# Patient Record
Sex: Male | Born: 1935
Health system: Southern US, Community
[De-identification: ages and names within clinical notes are randomized; demographics above are authoritative.]

## PROBLEM LIST (undated history)

## (undated) DIAGNOSIS — I10 Essential (primary) hypertension: Secondary | ICD-10-CM

## (undated) DIAGNOSIS — N400 Enlarged prostate without lower urinary tract symptoms: Secondary | ICD-10-CM

## (undated) DIAGNOSIS — J42 Unspecified chronic bronchitis: Secondary | ICD-10-CM

## (undated) DIAGNOSIS — I2510 Atherosclerotic heart disease of native coronary artery without angina pectoris: Secondary | ICD-10-CM

## (undated) DIAGNOSIS — F419 Anxiety disorder, unspecified: Secondary | ICD-10-CM

## (undated) DIAGNOSIS — J449 Chronic obstructive pulmonary disease, unspecified: Secondary | ICD-10-CM

## (undated) DIAGNOSIS — G20A1 Parkinson's disease without dyskinesia, without mention of fluctuations: Secondary | ICD-10-CM

## (undated) DIAGNOSIS — J189 Pneumonia, unspecified organism: Secondary | ICD-10-CM

## (undated) DIAGNOSIS — C44300 Unspecified malignant neoplasm of skin of unspecified part of face: Secondary | ICD-10-CM

## (undated) DIAGNOSIS — Z9981 Dependence on supplemental oxygen: Secondary | ICD-10-CM

## (undated) DIAGNOSIS — K219 Gastro-esophageal reflux disease without esophagitis: Secondary | ICD-10-CM

## (undated) DIAGNOSIS — I639 Cerebral infarction, unspecified: Secondary | ICD-10-CM

## (undated) DIAGNOSIS — M1990 Unspecified osteoarthritis, unspecified site: Secondary | ICD-10-CM

## (undated) DIAGNOSIS — I4891 Unspecified atrial fibrillation: Secondary | ICD-10-CM

## (undated) DIAGNOSIS — J4489 Other specified chronic obstructive pulmonary disease: Secondary | ICD-10-CM

## (undated) DIAGNOSIS — E7800 Pure hypercholesterolemia, unspecified: Secondary | ICD-10-CM

## (undated) DIAGNOSIS — G20 Parkinson's disease: Secondary | ICD-10-CM

## (undated) HISTORY — DX: Chronic obstructive pulmonary disease, unspecified: J44.9

## (undated) HISTORY — DX: Essential (primary) hypertension: I10

## (undated) HISTORY — PX: FRACTURE SURGERY: SHX138

## (undated) HISTORY — DX: Unspecified osteoarthritis, unspecified site: M19.90

## (undated) HISTORY — DX: Benign prostatic hyperplasia without lower urinary tract symptoms: N40.0

## (undated) HISTORY — DX: Other specified chronic obstructive pulmonary disease: J44.89

## (undated) HISTORY — DX: Pure hypercholesterolemia, unspecified: E78.00

## (undated) HISTORY — PX: TIBIA FRACTURE SURGERY: SHX806

## (undated) HISTORY — PX: CERVICAL DISCECTOMY: SHX98

## (undated) HISTORY — PX: SKIN CANCER DESTRUCTION: SHX778

## (undated) HISTORY — DX: Atherosclerotic heart disease of native coronary artery without angina pectoris: I25.10

---

## 2002-08-27 ENCOUNTER — Inpatient Hospital Stay (HOSPITAL_COMMUNITY): Admission: AC | Admit: 2002-08-27 | Discharge: 2002-08-31 | Payer: Self-pay

## 2002-08-27 ENCOUNTER — Encounter: Payer: Self-pay | Admitting: Emergency Medicine

## 2002-08-28 ENCOUNTER — Encounter: Payer: Self-pay | Admitting: General Surgery

## 2002-08-29 ENCOUNTER — Encounter: Payer: Self-pay | Admitting: General Surgery

## 2002-09-16 ENCOUNTER — Encounter: Payer: Self-pay | Admitting: General Surgery

## 2002-09-16 ENCOUNTER — Ambulatory Visit (HOSPITAL_COMMUNITY): Admission: RE | Admit: 2002-09-16 | Discharge: 2002-09-16 | Payer: Self-pay | Admitting: General Surgery

## 2007-12-07 ENCOUNTER — Emergency Department: Payer: Self-pay | Admitting: Emergency Medicine

## 2007-12-07 ENCOUNTER — Other Ambulatory Visit: Payer: Self-pay

## 2008-08-27 ENCOUNTER — Ambulatory Visit: Payer: Self-pay | Admitting: Cardiology

## 2008-09-02 ENCOUNTER — Ambulatory Visit: Payer: Self-pay

## 2008-09-09 ENCOUNTER — Ambulatory Visit: Payer: Self-pay | Admitting: Internal Medicine

## 2008-09-09 ENCOUNTER — Encounter: Payer: Self-pay | Admitting: Cardiology

## 2008-09-09 LAB — CONVERTED CEMR LAB
CO2: 24 meq/L (ref 19–32)
Calcium: 9.1 mg/dL (ref 8.4–10.5)
Glucose, Bld: 95 mg/dL (ref 70–99)
HCT: 42.7 % (ref 39.0–52.0)
Hemoglobin: 14.2 g/dL (ref 13.0–17.0)
INR: 1.2 (ref 0.0–1.5)
Potassium: 4.9 meq/L (ref 3.5–5.3)
RBC: 4.89 M/uL (ref 4.22–5.81)
RDW: 14 % (ref 11.5–15.5)
Sodium: 137 meq/L (ref 135–145)
WBC: 7.3 10*3/uL (ref 4.0–10.5)

## 2008-09-10 ENCOUNTER — Inpatient Hospital Stay (HOSPITAL_BASED_OUTPATIENT_CLINIC_OR_DEPARTMENT_OTHER): Admission: RE | Admit: 2008-09-10 | Discharge: 2008-09-10 | Payer: Self-pay | Admitting: Neurosurgery

## 2008-09-10 ENCOUNTER — Ambulatory Visit: Payer: Self-pay | Admitting: Cardiology

## 2008-09-17 ENCOUNTER — Ambulatory Visit: Payer: Self-pay | Admitting: Cardiology

## 2009-04-09 DIAGNOSIS — I2583 Coronary atherosclerosis due to lipid rich plaque: Secondary | ICD-10-CM

## 2009-04-09 DIAGNOSIS — E7800 Pure hypercholesterolemia, unspecified: Secondary | ICD-10-CM

## 2009-04-09 DIAGNOSIS — I10 Essential (primary) hypertension: Secondary | ICD-10-CM | POA: Insufficient documentation

## 2009-04-09 DIAGNOSIS — I2510 Atherosclerotic heart disease of native coronary artery without angina pectoris: Secondary | ICD-10-CM

## 2009-04-09 DIAGNOSIS — Z87898 Personal history of other specified conditions: Secondary | ICD-10-CM

## 2009-04-09 DIAGNOSIS — M1990 Unspecified osteoarthritis, unspecified site: Secondary | ICD-10-CM | POA: Insufficient documentation

## 2009-04-09 DIAGNOSIS — J441 Chronic obstructive pulmonary disease with (acute) exacerbation: Secondary | ICD-10-CM

## 2011-05-15 NOTE — Assessment & Plan Note (Signed)
[facility] OFFICE NOTE   NAME:Terry Taylor, Terry Taylor                   MRN:          [id number]  DATE:08/27/2008                            DOB:          11/19/1928    PRIMARY CARE PHYSICIAN:  Dr. Galen Daft. Guffey.   HISTORY OF PRESENT ILLNESS:  This is a 77 year old with a history of  hypertension, hypercholesterolemia, and COPD who presents to Cardiology  clinic for evaluation of dyspnea on exertion and chest pain.  The  patient states that for the last 6 months or so, he has been getting  some very mild chest heaviness with exertion.  Things that bring this on  include climbing up a flight of steps or carrying groceries into the  house.  He describes it as a heaviness, it is mild 2-3/10 on a pain  scale and does not radiate.  He also gets some dyspnea on exertion with  the same degree of exertion.  He will get short of breath after walking  about 50-100 yards on flat ground as well.  Occasionally, he does get  some bilateral leg aching when he walks about 50-100 yards also.  He  denies orthopnea and denies PND.  He denies syncope, presyncope, or  palpitations.  He continues to be quite active.  [place] and raises and boards horses.  He was a smoker in the past, but  quit.  He says about 30 years ago.  He does report frequent bronchitis  episodes over the winter and appears that he has been diagnosed with  COPD and has been on a course of steroids in the past.   PAST MEDICAL HISTORY:  1. Hypertension.  2. Hypercholesterolemia.  3. Chronic obstructive pulmonary disease.  Notes from his primary care      physician state that he had moderate obstruction on PFTs.  He does      have a history of what appear to be a COPD exacerbation.  He denies      wheezing.  4. Benign prostatic hypertrophy.  5. Osteoporosis.  6. History of osteoarthritis.  7. Echocardiogram done in August 2009 at his primary  care physician's      office, it was read as having an EF of 61%, mild left atrial      enlargement, mild left ventricular hypertrophy, mild diastolic      dysfunction, mild mitral regurgitation, and aortic sclerosis.   LABORATORY STUDIES:  Most recent labs were done in August 2009 at his  primary care physician's office, a hematocrit of 43.2, platelets of 180,  BUN 25, creatinine 1.33, glucose 106, triglycerides 144, HDL 42, LDL 73,  and TSH was normal.   MEDICATIONS:  1. He has an albuterol nebulizer p.r.n., he is not using this daily.  2. Pravastatin 40 mg daily.  3. Lisinopril 20 mg daily.   SOCIAL HISTORY:  The patient lives with his wife on a 100-acre farm near  [place].  He raises and boards horses.  He is retired [employer] manager.  He has a son who lives  near him.  He drinks about 1  alcoholic beverage a day.  He did quit smoking 30 years ago.  He was a  heavy smoker before that.   FAMILY HISTORY:  His father had a heart attack at the age of about 87,  his mother had a heart attack at the age of about 38, and his  grandfather died of a heart attack at the age of 45.   REVIEW OF SYSTEMS:  Negative, except as noted in the history of present  illness.   EKG today shows normal sinus rhythm with sinus arrhythmia.   PHYSICAL EXAMINATION:  VITAL SIGNS:  Blood pressure 118/64, heart rate  67 and irregular, and weight is 185 pounds.  GENERAL:  No apparent distress.  This is a well-developed elderly  gentleman.  NEUROLOGIC:  Alert and oriented x3.  No focal or motor defects.  Normal  affect.  NECK:  No JVD.  No thyromegaly or thyroid nodule.  HEENT:  Normal.  HEART:  Regular S1 and S2.  No S3, no S4, and no murmur.  Normal PMI  noted.  There are no carotid bruits.  There is a trace ankle edema  bilaterally.  There are strong 2+ posterior tibial pulses bilaterally.  EXTREMITIES: There is no clubbing or cyanosis.  LUNGS:  Clear to auscultation bilaterally with  normal respiratory  effort.  ABDOMEN:  Soft and nontender.  No hepatosplenomegaly.  There are normal  bowel sounds.  MUSCULOSKELETAL:  Normal exam.  SKIN:  Normal exam.   ASSESSMENT AND PLAN:  This is a 77 year old with a history of  hypertension, hypercholesterolemia, and chronic obstructive pulmonary  disease, who presents Cardiology Clinic for evaluation of mild  exertional chest heaviness and shortness of breath.  1. Chest pain/shortness of breath.  The patient does get mild chest      heaviness with moderate exertion.  He does not have symptoms at      rest.  He did say he gets short of breath with moderate exertion as      well.  The shortness of breath could be related to his chronic      obstructive pulmonary disease.  However, I am concerned that given      his risk factors, he could have ischemia accounting for his chest      heaviness with exertion.  Therefore, we will go ahead and obtain an      exercise treadmill Myoview to assess for ischemia and to risk      stratify him.  I have told him to go ahead and start taking a baby      aspirin daily.  He should continue on his statin and his      lisinopril.  Additionally, we will call his primary care doctor's      office to see if we can obtain the record of his pulmonary function      tests.  2. Hypertension.  The patient's blood pressure is on good control.  We      will continue him on his lisinopril of 20 mg daily.  3. Hypercholesterolemia.  LDL is 73.  He will continue him on his      pravastatin 40 mg daily.  4. Leg and thigh pain with exertion.  The patient does have excellent      posterior tibial pulses bilaterally.  I do not think that there is      significant obstructive peripheral arterial disease.  However, he says he thinks he had vascular studies on his legs      through his primary care physician's office, so we will see if we      can obtain reports of those.     Marca Ancona, MD  Electronically  Signed    DM/MedQ  DD: 08/27/2008  DT: 08/28/2008  Job #: [id number]   cc:   Galen Daft. Guffey

## 2011-05-15 NOTE — Cardiovascular Report (Signed)
Terry Taylor, Terry Taylor NO.:  [id number]   MEDICAL RECORD NO.:  [id number]          PATIENT TYPE:  OIB   LOCATION:  1999                         FACILITY:  [facility]   PHYSICIAN:  Marca Ancona, MD      DATE OF BIRTH:  1928/05/25   DATE OF PROCEDURE:  DATE OF DISCHARGE:  09/10/2008                            CARDIAC CATHETERIZATION   INDICATION:  Chest discomfort with exertion and suggestion of inferior  ischemia on a Myoview.   PROCEDURES:  1. Left heart catheterization.  2. Coronary artery angiography.  3. Left ventriculography.   PROCEDURE:  After informed consent was obtained, the patient's right  groin was sterilely prepped and draped.  1% lidocaine was used as a  local anesthesia, the right common femoral artery was accessed using  Seldinger  technique and a 4-French arterial sheath was placed in the  right common femoral artery.  The left coronary artery was gauged using  the 4-French JL4 catheter and the left ventricle was entered using the 4-  French angled Pigtail catheter.  There were no complications.   FINDINGS/HEMODYNAMICS:  1. Left ventricle 151/16/15.  2. Aorta 161/84.  3. Left ventriculogram.  The overall LV ejection fraction was 55%      There were no wall motion abnormalities.  There was no gradient      between the LV and the aorta.  4. Coronary angiography.  The left main and left anterior descending      arteries had no significant stenosis.  The left circumflex had a      30% mild distal stenosis and there was 40% stenosis in the first      obtuse marginal.  The right coronary artery was a dominant vessel      and had mild luminal irregularities only.   ASSESSMENT:  Nonobstructive coronary artery disease, probably a false  positive Myoview.   PLAN:  Aggressive risk factor modification.      Marca Ancona, MD  Electronically Signed     DM/MEDQ  D:  09/10/2008  T:  09/11/2008  Job:  [id number]

## 2011-05-15 NOTE — Assessment & Plan Note (Signed)
[facility] OFFICE NOTE   NAME:Terry Taylor, Terry Taylor                   MRN:          [id number]  DATE:08/27/2008                            DOB:          02-May-1928    PRIMARY CARE PHYSICIAN:  Galen Daft. Guffey, MD   HISTORY OF PRESENT ILLNESS:  This is a 77 year old with history of  hypertension, hypercholesterolemia, and COPD, who presented to  Cardiology Clinic initially in August for evaluation of dyspnea on  exertion and chest tightness associated with dyspnea on exertion.  The  patient did have an exercise Myoview study done in August 2009, which  showed a suggestion of inferior infarct with peri-infarct ischemia.  Given that finding along with the patient's symptoms, he was taken to  the cath lab, where he had a left heart catheterization done.  This  showed nonobstructive coronary artery disease with a 30% distal  circumflex and a 40% lesion in the obtuse marginal.  His left  ventricular end-diastolic pressure was 15 mmHg, so not significantly  elevated.  Since that time, the patient states that he has continued to  have fatigue and some shortness of breath with exertion.  When he gets  short of breath, he also feels like he has some chest heaviness as well.  He states he gets short of breath after walking for about 100 yards on  flat ground or if he climbs up a flight of stairs.  The shortness of  breath is not severe and it really does not stop him, but he does notice  it.  He has no orthopnea and no PND.  He is quite active in general.  He  was a smoker in the past, but did quit.  He in general just feels like  he has gotten weaker over the last 2 years.  Additionally, the patient  reports no pain at his right groin catheterization site with walking.   PAST MEDICAL HISTORY:  1. Hypertension.  2. Hypercholesterolemia.  3. COPD on PFTs done at his primary care physician's office.  The      patient did have  evidence for moderate obstruction.  He does have a      history of what appears to be COPD exacerbation.  He denies any      wheezing currently.  4. He is not on any inhalers right now, though he states that he has      had them in the past.  5. Benign prostatic hypertrophy.  6. Osteoporosis.  7. History of osteoarthritis.  8. Echocardiogram done in August 2009 at his primary care physician's      office, which was read as having EF of 61%, mild left atrial      enlargement, mild LVH, mild diastolic dysfunction, mild mitral      regurgitation, and aortic sclerosis.  9. Nonobstructive coronary artery disease.  The patient did have an      exercise Myoview in August 2009 that showed a suggestion of      inferior infarction with peri-infarct ischemia.  Left heart      catheterization  done in September 2009 showed nonobstructive      coronary artery disease with a 30% distal circumflex stenosis, 40%      first obtuse marginal stenosis, and an EF of 55% with no wall      motion abnormalities.  LVEDP on this study was 15 mmHg.   LABORATORY STUDIES:  Most recent labs done in August 2009 at his primary  care physician's office showed a hematocrit of 43.2, creatinine 1.3,  triglycerides 144, HDL 42, LDL 73, and TSH was normal.   MEDICATIONS:  1. Albuterol nebulizer p.r.n.  2. Pravastatin 40 mg daily.  3. Lisinopril 20 mg daily.  4. Aspirin 81 mg daily.   SOCIAL HISTORY:  The patient lives with his wife on a 100-acre farm in  [place].  He raises and boards horses.  He is a retired [employer] manager.  He drinks about one alcoholic beverage a day.  He  was a heavy smoker in the past, but he quit smoking 30 years ago.   PHYSICAL EXAMINATION:  VITAL SIGNS:  Blood pressure is 146/82, heart  rate is 64 and regular, weight is 191 pounds.  GENERAL:  No apparent distress.  He is a well-developed elderly  gentleman.  NEUROLOGIC:  Alert and oriented x3.  Normal affect.  NECK:  No JVD.   No thyromegaly or thyroid nodule.  HEART:  Regular S1 and S2.  No S3, no S4, no murmur.  Normal PMI is  noted.  There are no carotid bruits.  EXTREMITIES:  There is no ankle edema.  There are strong 2+ posterior  tibial pulses bilaterally.  At the right groin catheterization site,  there is some superficial bruising; however, the right femoral pulse is  strong without bruit.  LUNGS:  Clear to auscultation bilaterally with normal respiratory  effort.  ABDOMEN:  Soft, nontender.  No hepatosplenomegaly.  There are normal  bowel sounds.   ASSESSMENT AND PLAN:  This is a 77 year old with history of  hypertension, hypercholesterolemia, chronic obstructive pulmonary  disease, and nonobstructive coronary artery disease on a recent heart  catheterization, who returns to Cardiology Clinic for evaluation of his  dyspnea on exertion and mild chest heaviness.  1. Dyspnea on exertion.  The patient does have normal left ventricular      function and does not have significant coronary artery disease on      heart catheterization.  His left ventricular end-diastolic pressure      was 15 mmHg, which is only mildly elevated and it should not be      causing him significant symptoms.  I do think that his shortness of      breath maybe related to his chronic obstructive pulmonary disease,      which apparently was moderate in the past on PFTs.  The only      inhaler he has currently is albuterol p.r.n.  Therefore, today, I      will start him on a Spiriva inhaler to be used daily.  2. Hypertension.  The patient's blood pressure is a bit elevated today      at 146/82.  I will increase his lisinopril up to 40 mg daily, and      we will have him back to check a chem-7 in 2 weeks as well as blood      pressure check.  3. Hypercholesterolemia.  The patient's last LDL was 73.  He will      continue on his pravastatin.  4.  We will see the patient with a blood pressure check in 2 weeks.      After that, if he  has no further problems, we will see him back in      the office in about a year.     Marca Ancona, MD  Electronically Signed    DM/MedQ  DD: 09/17/2008  DT: 09/18/2008  Job #: [id number]   cc:   Gelene Mink

## 2011-05-18 NOTE — Discharge Summary (Signed)
NAME:  Terry Taylor, Terry Taylor                      ACCOUNT NO.:  [id number]   MEDICAL RECORD NO.:  [id number]                   PATIENT TYPE:  INP   LOCATION:  5734                                 FACILITY:  [facility]   PHYSICIAN:  Marta Lamas. Lindie Spruce, M.D.                DATE OF BIRTH:  February 27, 1928   DATE OF ADMISSION:  DATE OF DISCHARGE:                                 DISCHARGE SUMMARY   DISCHARGE DIAGNOSES:  1. Status post crush injury to abdomen and chest.  2. Multiple right-sided rib fractures x 4 with both anterior and posterior     component of fractures.  3. Chest wall contusion.  4. Abdominal wall contusion.  5. L2 through L5 transverse process fractures on the right.  6. Incidental finding of bilateral renal cyst.  7. Hypertension.  8. Hypercholesteremia.   HISTORY OF PRESENT ILLNESS:  This is a 77 year old Caucasian  male who was  working on his usual job in [employer]. He is a Programmer, applications. He was  on the ground when  an embankment collapsed and it crushed him against his  bulldozer. He was  able to be extricated, and  on arrival to the emergency  room  by EMS was complaining of right upper quadrant tenderness and  shortness of breath.   His vital signs showed a pulse of 84, blood pressure of 132/98, respirations  were 30, O2 saturation was 96 on room air.  His abdomen was minimally tender  in the right upper quadrant. His lungs were clear bilaterally.  A workup at  this time including a pelvis film was negative. A chest x-ray was negative.  An abdominal and pelvic CT were negative. A chest CT showed rib  fractures x  4, both anterior and posteriorly and anterior chest wall contusion. He also  had L2 through L5 right-sided transverse process fractures.   HOSPITAL COURSE:  The patient was admitted to the floor for observation and  pain control.  He was  seen in consultation per Dr. Ilean Skill secondary to multiple  transverse process  fractures. It was felt that the  patient was OK to  mobilize to tolerance. It was not felt that he needed any type of external  bracing. He was mobilized and continued to have fairly significant  complaints of pain.   On hospital day #3 he was  also having  pain in his abdomen which had not  been an issue previously. It was felt that he should have a repeat CT scan.  An abdominal CT was repeated and showed no evidence for organ injury, no  free fluid, no  free air. He did have incidental notation of bilateral renal  cysts which were fairly large. It was recommended that  he undergo an  ultrasound of these at some point for further evaluation. Followup chest x-  rays had shown minimal atelectasis in the lung bases, otherwise clear.   As the  patient continued to progress well, it was felt he was  medically  stable on  August 31, 2002, for discharge. The patient was prepared for  discharge at this time.   DISCHARGE MEDICATIONS:  He was  on Tylox 1 to 2 p.o. q.4-6h. p.r.n. pain,  #40, no refill. Activities are to tolerance.    DISCHARGE INSTRUCTIONS:  No working or driving until he is reassessed by the  trauma service. Again he does need followup of his bilateral renal cysts and  this has been discussed with the patient. We will pursue this further as an  outpatient. Again, he will follow up with the trauma service on September 08, 2002.     SHAWN RAYBURN                             Marta Lamas Lindie Spruce, M.D.    SR/MEDQ  D:  08/31/2002  T:  09/01/2002  Job:  [id number]

## 2011-05-18 NOTE — Consult Note (Signed)
NAME:  Terry Taylor, Terry Taylor                      ACCOUNT NO.:  [id number]   MEDICAL RECORD NO.:  [id number]                   PATIENT TYPE:  INP   LOCATION:  5734                                 FACILITY:  [facility]   PHYSICIAN:  Stefani Dama, M.D.               DATE OF BIRTH:  Aug 23, 1928   DATE OF CONSULTATION:  DATE OF DISCHARGE:                                   CONSULTATION   REASON FOR REQUEST:  Transverse process fractures L2, L3, L4, L5 of the  right.   HISTORY OF PRESENT ILLNESS:  The patient is a 77 year old right-handed white  male who was involved in an accident involving a bulldozer where he was  standing alongside the bulldozer and a bank of dirt collapsed down along his  side crushing him against the bulldozer.  The patient was extricated and  brought to the [facility].  He denied a loss of consciousness.  He  notes that he had severe pain along his right chest wall and his right  flank.  Workup demonstrates that the patient had multiple rib fractures on  the right side and transverse process fractures of the transverse processes  of L2, L3, L4, L5 all on the right side.  The plane x-rays demonstrated  these fractures off the tips of the transverse processes with normal lumbar  spinal alignment being maintained.  CT scan confirmed the presence of these  fractures without any associated internal injuries otherwise noted.   The patient notes that he has pain in the right flank, right hip, and the  right side of his back.  There is no external visible bruising at this time.   PAST MEDICAL HISTORY:  Notable for some hypertension.  He is on undisclosed  medication at this time.   PHYSICAL EXAMINATION:  Reveals that he is an alert, oriented, cooperative  individual who moves about slowly in the bed.  He turns up onto his right  side.  He has tenderness to palpation over the right flank region.  Percussion tenderness was not noted.  Motor strength in the lower  extremities reveals iliopsoas, quadriceps, tibialis anterior and gastrocs  have good strength and tone and bulk and confrontational testing in the  lower extremities.  Deep tendon reflexes 2+ in the patellar, 1+ in the  Achilles.  Babinski is downgoing.  The patient is intact to pin, light touch  in the distal lower extremities.   IMPRESSION:  The patient has evidence of transverse process fractures from  L2-L5.  They advised that these will heal spontaneously.  He should limit  his activity for the next 4-6 weeks.  However, no specific follow-up from a  neurosurgical standpoint is required.  Further radiographs are not required  unless the patient has any complicating features.  Please contact us if this  should be the case.  Stefani Dama, M.D.    Merla Riches  D:  08/27/2002  T:  08/30/2002  Job:  [id number]

## 2011-06-18 ENCOUNTER — Encounter: Payer: Self-pay | Admitting: Cardiovascular Disease

## 2011-10-02 ENCOUNTER — Emergency Department: Payer: Self-pay | Admitting: Emergency Medicine

## 2013-03-06 ENCOUNTER — Emergency Department (HOSPITAL_COMMUNITY): Payer: Medicare Other

## 2013-03-06 ENCOUNTER — Encounter (HOSPITAL_COMMUNITY): Payer: Self-pay | Admitting: Emergency Medicine

## 2013-03-06 ENCOUNTER — Inpatient Hospital Stay (HOSPITAL_COMMUNITY)
Admission: EM | Admit: 2013-03-06 | Discharge: 2013-03-10 | DRG: 072 | Disposition: A | Discharge status: Home / Self Care | Payer: Medicare Other | Attending: Internal Medicine | Admitting: Internal Medicine

## 2013-03-06 DIAGNOSIS — Z7982 Long term (current) use of aspirin: Secondary | ICD-10-CM

## 2013-03-06 DIAGNOSIS — J449 Chronic obstructive pulmonary disease, unspecified: Secondary | ICD-10-CM

## 2013-03-06 DIAGNOSIS — J4489 Other specified chronic obstructive pulmonary disease: Secondary | ICD-10-CM | POA: Diagnosis present

## 2013-03-06 DIAGNOSIS — J441 Chronic obstructive pulmonary disease with (acute) exacerbation: Secondary | ICD-10-CM | POA: Diagnosis present

## 2013-03-06 DIAGNOSIS — G9349 Other encephalopathy: Principal | ICD-10-CM | POA: Diagnosis present

## 2013-03-06 DIAGNOSIS — R531 Weakness: Secondary | ICD-10-CM

## 2013-03-06 DIAGNOSIS — I2510 Atherosclerotic heart disease of native coronary artery without angina pectoris: Secondary | ICD-10-CM | POA: Diagnosis present

## 2013-03-06 DIAGNOSIS — D696 Thrombocytopenia, unspecified: Secondary | ICD-10-CM | POA: Diagnosis present

## 2013-03-06 DIAGNOSIS — R509 Fever, unspecified: Secondary | ICD-10-CM

## 2013-03-06 DIAGNOSIS — Z87898 Personal history of other specified conditions: Secondary | ICD-10-CM

## 2013-03-06 DIAGNOSIS — G9340 Encephalopathy, unspecified: Secondary | ICD-10-CM

## 2013-03-06 DIAGNOSIS — R4182 Altered mental status, unspecified: Secondary | ICD-10-CM

## 2013-03-06 DIAGNOSIS — E7800 Pure hypercholesterolemia, unspecified: Secondary | ICD-10-CM | POA: Diagnosis present

## 2013-03-06 DIAGNOSIS — Z79899 Other long term (current) drug therapy: Secondary | ICD-10-CM

## 2013-03-06 DIAGNOSIS — M1990 Unspecified osteoarthritis, unspecified site: Secondary | ICD-10-CM

## 2013-03-06 DIAGNOSIS — I10 Essential (primary) hypertension: Secondary | ICD-10-CM

## 2013-03-06 DIAGNOSIS — E785 Hyperlipidemia, unspecified: Secondary | ICD-10-CM | POA: Diagnosis present

## 2013-03-06 DIAGNOSIS — Z87891 Personal history of nicotine dependence: Secondary | ICD-10-CM

## 2013-03-06 LAB — COMPREHENSIVE METABOLIC PANEL
BUN: 16 mg/dL (ref 6–23)
CO2: 27 mEq/L (ref 19–32)
Calcium: 9.3 mg/dL (ref 8.4–10.5)
Chloride: 96 mEq/L (ref 96–112)
Creatinine, Ser: 1.38 mg/dL — ABNORMAL HIGH (ref 0.50–1.35)
GFR calc non Af Amer: 46 mL/min — ABNORMAL LOW (ref >90)
Total Bilirubin: 1.1 mg/dL (ref 0.3–1.2)

## 2013-03-06 LAB — CBC WITH DIFFERENTIAL/PLATELET
Basophils Relative: 0 % (ref 0–1)
Eosinophils Relative: 0 % (ref 0–5)
HCT: 43.1 % (ref 39.0–52.0)
Hemoglobin: 15.3 g/dL (ref 13.0–17.0)
Lymphocytes Relative: 13 % (ref 12–46)
MCHC: 35.5 g/dL (ref 30.0–36.0)
MCV: 82.6 fL (ref 78.0–100.0)
Monocytes Absolute: 0.6 10*3/uL (ref 0.1–1.0)
Monocytes Relative: 7 % (ref 3–12)
Neutro Abs: 7.1 10*3/uL (ref 1.7–7.7)
RDW: 13.6 % (ref 11.5–15.5)

## 2013-03-06 LAB — URINALYSIS, ROUTINE W REFLEX MICROSCOPIC
Bilirubin Urine: NEGATIVE
Nitrite: NEGATIVE
Protein, ur: NEGATIVE mg/dL
Specific Gravity, Urine: 1.017 (ref 1.005–1.030)
Urobilinogen, UA: 1 mg/dL (ref 0.0–1.0)

## 2013-03-06 LAB — ETHANOL: Alcohol, Ethyl (B): <11 mg/dL (ref 0–11)

## 2013-03-06 MED ORDER — ACETAMINOPHEN 325 MG PO TABS
650.0000 mg | ORAL_TABLET | Freq: Once | ORAL | Status: AC
  Administered 2013-03-06: 650 mg via ORAL
  Filled 2013-03-06: qty 2

## 2013-03-06 NOTE — ED Provider Notes (Signed)
History     CSN: [id number]  Arrival date & time 03/06/13  1619   First MD Initiated Contact with Patient 03/06/13 1625      Chief Complaint  Patient presents with  . Altered Mental Status   HPI Terry Taylor is a 79 y.o. male who presents to the emergency department for altered mental status.  Accompanied by wife.  Wife reports that patient has bee doing weird things today.  She says that he wanted to go to work even though he has been retired for several years.  Also disoriented.  Reports that he has a history of sometimes not knowing what day it is, but never has been like this before.  Patient also not walking.  Patient has had diffuse weakness and family concerned that he was going to fall over while walking.  Patient with no cough, congestion, or flu like symptoms.  No changes in urination.  No rashes.  No inflamed joints.    Past Medical History  Diagnosis Date  . Coronary atherosclerosis of unspecified type of vessel, native or graft   . Unspecified essential hypertension   . Pure hypercholesterolemia     IIA  . Chronic airway obstruction, not elsewhere classified   . Osteoarthrosis, unspecified whether generalized or localized, unspecified site   . Benign prostatic hypertrophy     hx    History reviewed. No pertinent past surgical history.  Family History  Problem Relation Age of Onset  . Coronary artery disease      family hx    History  Substance Use Topics  . Smoking status: Former Games developer  . Smokeless tobacco: Not on file     Comment: quit in 1979  . Alcohol Use: Yes     Comment: 1/day      Review of Systems  Constitutional: Negative for fever and chills.  HENT: Negative for congestion, sore throat and neck pain.   Respiratory: Negative for cough.   Gastrointestinal: Negative for nausea, vomiting, abdominal pain, diarrhea and constipation.  Endocrine: Negative for polyuria.  Genitourinary: Negative for dysuria and hematuria.  Skin: Negative for  rash.  Neurological: Negative for headaches.  Psychiatric/Behavioral: Negative.   All other systems reviewed and are negative.    Allergies  Review of patient's allergies indicates no known allergies.  Home Medications  No current outpatient prescriptions on file.  BP 164/84  Ht 5\' 11"  (1.803 m)  Wt 200 lb (90.719 kg)  BMI 27.91 kg/m2  SpO2 98%  Physical Exam  Nursing note and vitals reviewed. Constitutional: He is oriented to person, place, and time. He appears well-developed and well-nourished. No distress.  HENT:  Head: Normocephalic and atraumatic.  Right Ear: External ear normal.  Left Ear: External ear normal.  Mouth/Throat: Oropharynx is clear and moist. No oropharyngeal exudate.  Eyes: Conjunctivae are normal. Pupils are equal, round, and reactive to light. Right eye exhibits no discharge.  Neck: Normal range of motion. Neck supple. No tracheal deviation present.  Cardiovascular: Normal rate, regular rhythm and intact distal pulses.   Pulmonary/Chest: Effort normal. No respiratory distress. He has no wheezes. He has no rales.  Abdominal: Soft. He exhibits no distension. There is no tenderness. There is no rebound and no guarding.  Musculoskeletal: Normal range of motion.  Neurological: He is alert and oriented to person, place, and time. He has normal strength and normal reflexes. No cranial nerve deficit or sensory deficit. Coordination normal. GCS eye subscore is 4. GCS verbal subscore is 5. GCS  motor subscore is 6.  Skin: Skin is warm and dry. No rash noted. He is not diaphoretic.  Psychiatric: He has a normal mood and affect.    ED Course  LUMBAR PUNCTURE Date/Time: 03/07/2013 1:30 AM Performed by: Arloa Koh Authorized by: Arloa Koh Consent: Verbal consent obtained. written consent obtained. Risks and benefits: risks, benefits and alternatives were discussed Consent given by: spouse Patient identity confirmed: verbally with patient and arm band Time out:  Immediately prior to procedure a "time out" was called to verify the correct patient, procedure, equipment, support staff and site/side marked as required. Indications: evaluation for infection and evaluation for altered mental status Anesthesia: local infiltration Local anesthetic: lidocaine 1% with epinephrine Anesthetic total: 10 ml Patient sedated: no Preparation: Patient was prepped and draped in the usual sterile fashion. Lumbar space: L3-L4 interspace Patient's position: left lateral decubitus Needle gauge: 20 Needle type: diamond point Needle length: 3.5 in Number of attempts: 4 Comments: Unable to obtain fluid despite 4 attempts. Procedure stopped and interventional radiology consulted.   (including critical care time)  Labs Reviewed  CBC WITH DIFFERENTIAL - Abnormal; Notable for the following:    Platelets 136 (*)    Neutrophils Relative 80 (*)    All other components within normal limits  COMPREHENSIVE METABOLIC PANEL - Abnormal; Notable for the following:    Sodium 134 (*)    Creatinine, Ser 1.38 (*)    GFR calc non Af Amer 46 (*)    GFR calc Af Amer 53 (*)    All other components within normal limits  URINALYSIS, ROUTINE W REFLEX MICROSCOPIC - Abnormal; Notable for the following:    Hgb urine dipstick SMALL (*)    All other components within normal limits  PROTEIN, CSF - Abnormal; Notable for the following:    Total  Protein, CSF 68 (*)    All other components within normal limits  CSF CELL COUNT WITH DIFFERENTIAL - Abnormal; Notable for the following:    RBC Count, CSF 3 (*)    All other components within normal limits  CSF CELL COUNT WITH DIFFERENTIAL - Abnormal; Notable for the following:    RBC Count, CSF 49 (*)    All other components within normal limits  GRAM STAIN  URINE CULTURE  CSF CULTURE  ETHANOL  URINE MICROSCOPIC-ADD ON  GLUCOSE, CSF   Dg Chest 2 View  03/06/2013  *RADIOLOGY REPORT*  Clinical Data: Altered mental status.  Ex-smoker.  CHEST - 2  VIEW  Comparison: None.  Findings: Severe bilateral glenohumeral joint osteoarthritis. Patient rotated right.  Mild cardiomegaly with a tortuous thoracic aorta. No pleural effusion or pneumothorax.  No congestive failure.  Low lung volumes with resultant pulmonary interstitial prominence.  IMPRESSION: Cardiomegaly and low lung volumes, without acute disease.   Original Report Authenticated By: Jeronimo Greaves, M.D.    Ct Head Wo Contrast  03/06/2013  *RADIOLOGY REPORT*  Clinical Data: Altered mental status.  CT HEAD WITHOUT CONTRAST  Technique:  Contiguous axial images were obtained from the base of the skull through the vertex without contrast.  Comparison: None.  Findings: Bone windows demonstrate clear paranasal sinuses and mastoid air cells.  Soft tissue windows demonstrate mildly advanced cerebral atrophy. Mild ventriculomegaly, felt to be secondary.  Cerebellar atrophy as well.  No  mass lesion, hemorrhage, hydrocephalus, acute infarct, intra- axial, or extra-axial fluid collection.  IMPRESSION:  1. No acute intracranial abnormality. 2.  Advanced cerebral and cerebellar atrophy.  Mild ventriculomegaly is felt to be secondary.  Original Report Authenticated By: Jeronimo Greaves, M.D.    Dg Lumbar Puncture Fluoro Guide  03/06/2013  *RADIOLOGY REPORT*  Clinical Data:  Altered mental status, evaluate for meningitis. Unsuccessful attempted bedside lumbar puncture in the emergency department.  DIAGNOSTIC LUMBAR PUNCTURE UNDER FLUOROSCOPIC GUIDANCE  Fluoroscopy time:  0.5 minutes.  Technique:  Informed consent was obtained from the patient prior to the procedure, including potential complications of headache, allergy, and pain.   With the patient prone, the lower back was prepped with Betadine.  1% Lidocaine was used for local anesthesia. Lumbar puncture was performed at the L2-L3 level using a 20 gauge needle with return of clear CSF with an opening pressure of 18.5 cm water.   11.5 ml of CSF were obtained for  laboratory studies.  The patient tolerated the procedure well and there were no apparent complications.  IMPRESSION:  Technically successful lumbar puncture under fluoroscopic guidance.   Original Report Authenticated By: Malachy Moan, M.D.     Date: 03/07/2013  Rate: 76  Rhythm: normal sinus rhythm  QRS Axis: normal  Intervals: normal  ST/T Wave abnormalities: normal  Conduction Disutrbances:none  Narrative Interpretation: Normal sinus rhythm. Normal EKG. No previous compared to the  Old EKG Reviewed: none available    1. Altered mental status   2. Fever    MDM   79 year old male who presents to the emergency department with altered mental status over the last 24 hours. On exam patient mildly confused but oriented x3. Neurologic exam completely normal.  Head CT and basic labs performed and within normal limits. Rectal temperature found to be greater than 101F. Concern for meningitis given altered mental status and fever. Lumbar puncture attempted by myself and unfortunately we are unable to obtain fluid. Interventional radiology consulted and lumbar puncture performed under fluoroscopy. No meningitis found on spinal fluid. No evidence of septic joint or cellulitis on exam. No abdominal tenderness to indicate intra-abdominal source of fever. No flulike symptoms or other obvious source of fever. Consult the hospitalists given fever and altered mental status but no clear explanation. Hospitalist wishing for empiric antibiotics and Tamiflu at this point in time. Vancomycin, Zosyn and Tamiflu ordered. Patient in stable condition with normal vital signs outside of fever. No other acute concerns on the emergency department. Patient admitted.      Arloa Koh, MD 03/07/13 1610

## 2013-03-06 NOTE — ED Notes (Signed)
Pt to XR

## 2013-03-06 NOTE — ED Notes (Signed)
Pt. returned from XR. 

## 2013-03-06 NOTE — ED Notes (Signed)
Pt to ED via [facility] with reports of pt having altered mental status.  Family reported to EMS that pt was fine until he went to the barn last pm at approx. 10pm when he came back he was not acting his normal.  Family st's condition has continued to get worse since then.

## 2013-03-06 NOTE — ED Notes (Signed)
MD at bedside. 

## 2013-03-06 NOTE — ED Notes (Signed)
Pt BIB EMS for AMS that started last night around 2200 per pt wife. Wife reports pt "has bad knees" and "always has trouble walking" but has increased difficulty today. Saw PCP on Monday for HTN

## 2013-03-07 ENCOUNTER — Inpatient Hospital Stay (HOSPITAL_COMMUNITY): Payer: Medicare Other

## 2013-03-07 ENCOUNTER — Encounter (HOSPITAL_COMMUNITY): Payer: Self-pay | Admitting: *Deleted

## 2013-03-07 DIAGNOSIS — R509 Fever, unspecified: Secondary | ICD-10-CM | POA: Diagnosis present

## 2013-03-07 DIAGNOSIS — G9340 Encephalopathy, unspecified: Secondary | ICD-10-CM | POA: Diagnosis present

## 2013-03-07 DIAGNOSIS — R531 Weakness: Secondary | ICD-10-CM | POA: Diagnosis present

## 2013-03-07 LAB — CSF CELL COUNT WITH DIFFERENTIAL
RBC Count, CSF: 3 /mm3 — ABNORMAL HIGH
Tube #: 4
WBC, CSF: 1 /mm3 (ref 0–5)

## 2013-03-07 LAB — BASIC METABOLIC PANEL
CO2: 24 mEq/L (ref 19–32)
Calcium: 8.5 mg/dL (ref 8.4–10.5)
Creatinine, Ser: 1.4 mg/dL — ABNORMAL HIGH (ref 0.50–1.35)
Glucose, Bld: 83 mg/dL (ref 70–99)

## 2013-03-07 LAB — CBC
HCT: 39.3 % (ref 39.0–52.0)
Hemoglobin: 13.8 g/dL (ref 13.0–17.0)
MCH: 28.9 pg (ref 26.0–34.0)
MCHC: 35.1 g/dL (ref 30.0–36.0)
MCV: 82.4 fL (ref 78.0–100.0)
Platelets: 126 10*3/uL — ABNORMAL LOW (ref 150–400)
RBC: 4.77 MIL/uL (ref 4.22–5.81)
RDW: 13.6 % (ref 11.5–15.5)
WBC: 4.7 10*3/uL (ref 4.0–10.5)

## 2013-03-07 LAB — URINE CULTURE: Colony Count: 10000

## 2013-03-07 LAB — INFLUENZA PANEL BY PCR (TYPE A & B)
H1N1 flu by pcr: NOT DETECTED
Influenza A By PCR: NEGATIVE
Influenza B By PCR: NEGATIVE

## 2013-03-07 MED ORDER — TAMSULOSIN HCL 0.4 MG PO CAPS
0.4000 mg | ORAL_CAPSULE | Freq: Every day | ORAL | Status: DC
  Administered 2013-03-07 – 2013-03-10 (×4): 0.4 mg via ORAL
  Filled 2013-03-07 (×4): qty 1

## 2013-03-07 MED ORDER — AMLODIPINE BESYLATE 5 MG PO TABS
5.0000 mg | ORAL_TABLET | Freq: Every day | ORAL | Status: DC
  Administered 2013-03-07 – 2013-03-10 (×4): 5 mg via ORAL
  Filled 2013-03-07 (×5): qty 1

## 2013-03-07 MED ORDER — ENOXAPARIN SODIUM 40 MG/0.4ML ~~LOC~~ SOLN
40.0000 mg | Freq: Every day | SUBCUTANEOUS | Status: DC
  Administered 2013-03-07 – 2013-03-10 (×4): 40 mg via SUBCUTANEOUS
  Filled 2013-03-07 (×4): qty 0.4

## 2013-03-07 MED ORDER — SODIUM CHLORIDE 0.9 % IV SOLN
INTRAVENOUS | Status: DC
  Administered 2013-03-07 – 2013-03-08 (×2): via INTRAVENOUS
  Administered 2013-03-09: 75 mL/h via INTRAVENOUS

## 2013-03-07 MED ORDER — LOSARTAN POTASSIUM 25 MG PO TABS
25.0000 mg | ORAL_TABLET | Freq: Every day | ORAL | Status: DC
  Administered 2013-03-07 – 2013-03-09 (×3): 25 mg via ORAL
  Filled 2013-03-07 (×4): qty 1

## 2013-03-07 MED ORDER — VANCOMYCIN HCL 10 G IV SOLR
1250.0000 mg | INTRAVENOUS | Status: DC
  Filled 2013-03-07: qty 1250

## 2013-03-07 MED ORDER — OXYCODONE HCL 5 MG PO TABS
5.0000 mg | ORAL_TABLET | ORAL | Status: DC | PRN
  Administered 2013-03-10: 5 mg via ORAL
  Filled 2013-03-07: qty 1

## 2013-03-07 MED ORDER — PIPERACILLIN-TAZOBACTAM 3.375 G IVPB 30 MIN
3.3750 g | Freq: Once | INTRAVENOUS | Status: AC
  Administered 2013-03-07: 3.375 g via INTRAVENOUS
  Filled 2013-03-07: qty 50

## 2013-03-07 MED ORDER — OMEGA-3-ACID ETHYL ESTERS 1 G PO CAPS
2.0000 g | ORAL_CAPSULE | Freq: Two times a day (BID) | ORAL | Status: DC
  Administered 2013-03-08 – 2013-03-10 (×5): 2 g via ORAL
  Filled 2013-03-07 (×7): qty 2

## 2013-03-07 MED ORDER — ASPIRIN EC 325 MG PO TBEC
325.0000 mg | DELAYED_RELEASE_TABLET | Freq: Every day | ORAL | Status: DC
  Administered 2013-03-07 – 2013-03-10 (×4): 325 mg via ORAL
  Filled 2013-03-07 (×4): qty 1

## 2013-03-07 MED ORDER — OSELTAMIVIR PHOSPHATE 75 MG PO CAPS
75.0000 mg | ORAL_CAPSULE | Freq: Once | ORAL | Status: AC
  Administered 2013-03-07: 75 mg via ORAL
  Filled 2013-03-07: qty 1

## 2013-03-07 MED ORDER — PIPERACILLIN-TAZOBACTAM 3.375 G IVPB
3.3750 g | Freq: Three times a day (TID) | INTRAVENOUS | Status: DC
  Administered 2013-03-07 – 2013-03-08 (×4): 3.375 g via INTRAVENOUS
  Filled 2013-03-07 (×6): qty 50

## 2013-03-07 MED ORDER — ACETAMINOPHEN 325 MG PO TABS
650.0000 mg | ORAL_TABLET | Freq: Four times a day (QID) | ORAL | Status: DC | PRN
  Administered 2013-03-08: 650 mg via ORAL
  Filled 2013-03-07: qty 2

## 2013-03-07 MED ORDER — OSELTAMIVIR PHOSPHATE 75 MG PO CAPS
75.0000 mg | ORAL_CAPSULE | Freq: Two times a day (BID) | ORAL | Status: DC
  Administered 2013-03-07: 75 mg via ORAL
  Filled 2013-03-07 (×4): qty 1

## 2013-03-07 MED ORDER — ONDANSETRON HCL 4 MG/2ML IJ SOLN: 4.0000 mg | Freq: Four times a day (QID) | INTRAMUSCULAR | Status: DC | PRN

## 2013-03-07 MED ORDER — ASPIRIN BUFFERED 325 MG PO TABS
325.0000 mg | ORAL_TABLET | Freq: Every day | ORAL | Status: DC
  Filled 2013-03-07 (×2): qty 1

## 2013-03-07 MED ORDER — ALUM & MAG HYDROXIDE-SIMETH 200-200-20 MG/5ML PO SUSP: 30.0000 mL | Freq: Four times a day (QID) | ORAL | Status: DC | PRN

## 2013-03-07 MED ORDER — TIOTROPIUM BROMIDE MONOHYDRATE 18 MCG IN CAPS
18.0000 ug | ORAL_CAPSULE | Freq: Every day | RESPIRATORY_TRACT | Status: DC
  Administered 2013-03-08 – 2013-03-10 (×3): 18 ug via RESPIRATORY_TRACT
  Filled 2013-03-07: qty 5

## 2013-03-07 MED ORDER — HYDROMORPHONE HCL PF 1 MG/ML IJ SOLN: 0.5000 mg | INTRAMUSCULAR | Status: DC | PRN

## 2013-03-07 MED ORDER — SODIUM CHLORIDE 0.9 % IV SOLN
1750.0000 mg | Freq: Once | INTRAVENOUS | Status: AC
  Administered 2013-03-07: 1750 mg via INTRAVENOUS
  Filled 2013-03-07: qty 1750

## 2013-03-07 MED ORDER — ONDANSETRON HCL 4 MG PO TABS: 4.0000 mg | ORAL_TABLET | Freq: Four times a day (QID) | ORAL | Status: DC | PRN

## 2013-03-07 MED ORDER — MOMETASONE FURO-FORMOTEROL FUM 100-5 MCG/ACT IN AERO
2.0000 | INHALATION_SPRAY | Freq: Two times a day (BID) | RESPIRATORY_TRACT | Status: DC
  Administered 2013-03-07 – 2013-03-10 (×6): 2 via RESPIRATORY_TRACT
  Filled 2013-03-07: qty 8.8

## 2013-03-07 MED ORDER — ACETAMINOPHEN 650 MG RE SUPP: 650.0000 mg | Freq: Four times a day (QID) | RECTAL | Status: DC | PRN

## 2013-03-07 NOTE — ED Provider Notes (Signed)
I saw and evaluated the patient, reviewed the resident's note and I agree with the findings and plan. I have reviewed EKG and agree with the resident interpretation.  you Patient presenting due to altered mental status and difficulty ambulating that started yesterday. Temperature today of 101. Patient is awake and alert but is unable to ambulate. He can lift both legs and has 4/5 strength. Labs without a source for infection an LP was done which was also negative. Will treat empirically and admit  Gwyneth Sprout, MD 03/07/13 2341

## 2013-03-07 NOTE — ED Notes (Signed)
Pt clean and dry prior to leaving ED 

## 2013-03-07 NOTE — Progress Notes (Signed)
ANTIBIOTIC CONSULT NOTE - INITIAL  Pharmacy Consult for vancomycin, Zosyn Indication: rule out pneumonia  No Known Allergies  Patient Measurements: Height: 5\' 11"  (180.3 cm) Weight: 200 lb (90.719 kg) IBW/kg (Calculated) : 75.3  Vital Signs: Temp: 101.2 F (38.4 C) (03/07 1956) Temp src: Rectal (03/07 1956) BP: 152/78 mmHg (03/07 1956) Intake/Output from previous day:   Intake/Output from this shift:    Labs:  Recent Labs  03/06/13 1651  WBC 8.9  HGB 15.3  PLT 136*  CREATININE 1.38*   Estimated Creatinine Clearance: 47.6 ml/min (by C-G formula based on Cr of 1.38). No results found for this basename: VANCOTROUGH, VANCOPEAK, VANCORANDOM, GENTTROUGH, GENTPEAK, GENTRANDOM, TOBRATROUGH, TOBRAPEAK, TOBRARND, AMIKACINPEAK, AMIKACINTROU, AMIKACIN,  in the last 72 hours   Microbiology: Recent Results (from the past 720 hour(s))  GRAM STAIN     Status: None   Collection Time    03/06/13 10:40 PM      Result Value Range Status   Specimen Description CSF   Final   Special Requests Normal   Final   Gram Stain     Final   Value: CYTOSPIN SLIDE     WBC PRESENT,BOTH PMN AND MONONUCLEAR     NO ORGANISMS SEEN   Report Status 03/06/2013 FINAL   Final    Medical History: Past Medical History  Diagnosis Date  . Coronary atherosclerosis of unspecified type of vessel, native or graft   . Unspecified essential hypertension   . Pure hypercholesterolemia     IIA  . Chronic airway obstruction, not elsewhere classified   . Osteoarthrosis, unspecified whether generalized or localized, unspecified site   . Benign prostatic hypertrophy     hx    Medications:  Scheduled:  . [COMPLETED] acetaminophen  650 mg Oral Once  . enoxaparin (LOVENOX) injection  40 mg Subcutaneous Q24H  . [COMPLETED] oseltamivir  75 mg Oral Once  . oseltamivir  75 mg Oral BID   Assessment: 79 yo male presented to ED with confusion and weakness. Pharmacy to manage vancomycin and Zosyn for r/o pneumonia.  Patient has already received vancomycin 1.75gm x 1 and Zosyn 3.375gm x 1.   Goal of Therapy:  Vancomycin trough level 15-20 mcg/ml  Plan:  1. Vancomycin 1250mg  IV Q24H.  2. Zosyn 3.375gm IV Q8H (4 hr infusion)  Emeline Gins 03/07/2013,1:42 AM

## 2013-03-07 NOTE — Progress Notes (Addendum)
[facility] PROGRESS NOTE  Terry Taylor LKG:[id number] DOB: 01-11-1928 DOA: 03/06/2013 PCP: Evelene Croon, MD  Assessment/Plan: Fever Afebrile since yesterday.  Blood cultures unfortunately were not sent. Urine culture pending.  CSF culture pending.  Patient on empiric broad-spectrum antibiotics with vancomycin and Zosyn, chest x-ray not suggestive of pneumonia in patient is not endorsing any respiratory symptoms.  Patient also on Tamiflu (patient does not endorse any respiratory symptoms such as sinus congestion or shortness of breath at this time).  Check influenza panel if negative will discontinue Tamiflu.  Continue broad-spectrum antibiotics, if patient continues to be afebrile transitioned to levofloxacin.  Defined course of antibiotics.    Acute encephalopathy/altered mental status/acute delirium Etiology unclear.  Improved.  Head CT negative for any acute intracranial abnormality.  Lumbar puncture on 03/06/2013, tube 4, 0 WBCs, CSF glucose 51.  Will do MRI of the brain for further evaluation.  Continue neuro checks.  Discussed with patient's daughter-in-law, she noted that patient may have accidentally taken his Fridays and today's dose of his medications yesterday.  Continue to monitor.  Generalized weakness May be related to fever and altered mental status.  Will request physical therapy evaluation.  HTN Restart home medications, start losartan at lower dose.  Amlodipine at lower dose.  Hold valsartan/hydrochlorothiazide.  Hyperlipidemia Continue Lovaza.   CAD Stable continue aspirin.  COPD Stable.  Currently not in exacerbation.  Continue Advair, Spiriva, and treatments.  Thrombocytopenia Stable.  Continue to monitor.  Prophylaxis Lovenox.  Code Status: Full code. Family Communication: Daughter by bedside. Disposition Plan: Pending.  Consultants:  None.  Procedures:  LP on 03/06/2013  Antibiotics:  Vancomycin 03/07/2013 >>  Zosyn 03/07/2013  >>  Oseltamivir 03/07/2013 >>  HPI/Subjective: Per daughter at bedside mentation has improved. Patient feels better, but has been feeling weak.  Objective: Filed Vitals:   03/07/13 0200 03/07/13 0230 03/07/13 0622 03/07/13 1000  BP: 133/67 166/90 154/75 154/81  Pulse: 68 65 72 64  Temp:  98.3 F (36.8 C) 97.6 F (36.4 C) 98.2 F (36.8 C)  TempSrc:  Oral Oral Oral  Resp: 15 17 19 16   Height:      Weight:      SpO2: 96% 96% 96% 98%    Intake/Output Summary (Last 24 hours) at 03/07/13 1314 Last data filed at 03/07/13 1000  Gross per 24 hour  Intake    912 ml  Output    300 ml  Net    612 ml   Filed Weights   03/06/13 1631  Weight: 90.719 kg (200 lb)    Exam: Physical Exam: General: Awake, Oriented to self, location, month, and year, No acute distress. HEENT: EOMI. Neck: Supple CV: S1 and S2 Lungs: Clear to ascultation bilaterally Abdomen: Soft, Nontender, Nondistended, +bowel sounds. Ext: Good pulses. Trace edema.  Data Reviewed: Basic Metabolic Panel:  Recent Labs Lab 03/06/13 1651 03/07/13 0600  NA 134* 134*  K 4.7 4.0  CL 96 99  CO2 27 24  GLUCOSE 92 83  BUN 16 18  CREATININE 1.38* 1.40*  CALCIUM 9.3 8.5   Liver Function Tests:  Recent Labs Lab 03/06/13 1651  AST 14  ALT 12  ALKPHOS 51  BILITOT 1.1  PROT 6.6  ALBUMIN 3.8   No results found for this basename: LIPASE, AMYLASE,  in the last 168 hours No results found for this basename: AMMONIA,  in the last 168 hours CBC:  Recent Labs Lab 03/06/13 1651 03/07/13 0600  WBC 8.9 4.7  NEUTROABS 7.1  --  HGB 15.3 13.8  HCT 43.1 39.3  MCV 82.6 82.4  PLT 136* 126*   Cardiac Enzymes: No results found for this basename: CKTOTAL, CKMB, CKMBINDEX, TROPONINI,  in the last 168 hours BNP (last 3 results) No results found for this basename: PROBNP,  in the last 8760 hours CBG: No results found for this basename: GLUCAP,  in the last 168 hours  Recent Results (from the past 240 hour(s))  GRAM  STAIN     Status: None   Collection Time    03/06/13 10:40 PM      Result Value Range Status   Specimen Description CSF   Final   Special Requests Normal   Final   Gram Stain     Final   Value: CYTOSPIN SLIDE     WBC PRESENT,BOTH PMN AND MONONUCLEAR     NO ORGANISMS SEEN   Report Status 03/06/2013 FINAL   Final     Studies: Dg Chest 2 View  03/06/2013  *RADIOLOGY REPORT*  Clinical Data: Altered mental status.  Ex-smoker.  CHEST - 2 VIEW  Comparison: None.  Findings: Severe bilateral glenohumeral joint osteoarthritis. Patient rotated right.  Mild cardiomegaly with a tortuous thoracic aorta. No pleural effusion or pneumothorax.  No congestive failure.  Low lung volumes with resultant pulmonary interstitial prominence.  IMPRESSION: Cardiomegaly and low lung volumes, without acute disease.   Original Report Authenticated By: Jeronimo Greaves, M.D.    Ct Head Wo Contrast  03/06/2013  *RADIOLOGY REPORT*  Clinical Data: Altered mental status.  CT HEAD WITHOUT CONTRAST  Technique:  Contiguous axial images were obtained from the base of the skull through the vertex without contrast.  Comparison: None.  Findings: Bone windows demonstrate clear paranasal sinuses and mastoid air cells.  Soft tissue windows demonstrate mildly advanced cerebral atrophy. Mild ventriculomegaly, felt to be secondary.  Cerebellar atrophy as well.  No  mass lesion, hemorrhage, hydrocephalus, acute infarct, intra- axial, or extra-axial fluid collection.  IMPRESSION:  1. No acute intracranial abnormality. 2.  Advanced cerebral and cerebellar atrophy.  Mild ventriculomegaly is felt to be secondary.   Original Report Authenticated By: Jeronimo Greaves, M.D.    Dg Lumbar Puncture Fluoro Guide  03/06/2013  *RADIOLOGY REPORT*  Clinical Data:  Altered mental status, evaluate for meningitis. Unsuccessful attempted bedside lumbar puncture in the emergency department.  DIAGNOSTIC LUMBAR PUNCTURE UNDER FLUOROSCOPIC GUIDANCE  Fluoroscopy time:  0.5  minutes.  Technique:  Informed consent was obtained from the patient prior to the procedure, including potential complications of headache, allergy, and pain.   With the patient prone, the lower back was prepped with Betadine.  1% Lidocaine was used for local anesthesia. Lumbar puncture was performed at the L2-L3 level using a 20 gauge needle with return of clear CSF with an opening pressure of 18.5 cm water.   11.5 ml of CSF were obtained for laboratory studies.  The patient tolerated the procedure well and there were no apparent complications.  IMPRESSION:  Technically successful lumbar puncture under fluoroscopic guidance.   Original Report Authenticated By: Malachy Moan, M.D.     Scheduled Meds: . enoxaparin (LOVENOX) injection  40 mg Subcutaneous Daily  . oseltamivir  75 mg Oral BID  . piperacillin-tazobactam (ZOSYN)  IV  3.375 g Intravenous Q8H  . [START ON 03/08/2013] vancomycin  1,250 mg Intravenous Q24H   Continuous Infusions: . sodium chloride 75 mL/hr at 03/07/13 0228    Principal Problem:   Fever Active Problems:   HYPERCHOLESTEROLEMIA  IIA   HYPERTENSION,  UNSPECIFIED   CAD, UNSPECIFIED SITE   COPD   Acute encephalopathy   Weakness generalized    REDDY,SRIKAR A, MD  [facility] Pager [phone]. If 7PM-7AM, please contact night-coverage at [email or web address], password [facility] 03/07/2013, 1:14 PM  LOS: 1 day

## 2013-03-07 NOTE — ED Notes (Signed)
Attempted to call report. Floor RN unable to accept report.  

## 2013-03-07 NOTE — H&P (Signed)
[facility] History and Physical  Terry Taylor JXB:[id number] DOB: 1928/12/06 DOA: 03/06/2013  Referring physician: EDP PCP: Evelene Croon, MD  Specialists:   Chief Complaint: Confusion  HPI: Terry Taylor is a 79 y.o. male who presents to the ED with worsening confusion and weakness since last night.   His wife and daughter give the history and report that he was confused and thought he had to go to work, but he has been retired for many years.  They report that he also had increased weakness and difficulty walking .  He also had complaints of a headache in the back of his head.    He was found to have a temperature of 101.9  When EMS checked him prior to transport.  He reports having a cough and some SOB, and has a history of COPD.   He denies having any nausea, vomiting or diarrhea.   In the ED he was evaluated and a CT scan of the Head  was performed which was negative for acute findings, and an LP was also performed due to suspicion for meningitis, but the result returned negative for  Evidence of bacterial or viral meningitis.  He was placed on empiric antibiotic coverage for early pneumonia, and Tamiflu and referred for admission.     Review of Systems: The patient denies anorexia, fever, weight loss, vision loss, hoarseness, chest pain, syncope, dyspnea on exertion, peripheral edema, balance deficits, hemoptysis, abdominal pain,nausea, vomiting, diarrhea, constipation, melena, hematochezia, hematemesis, severe indigestion/heartburn, dysuria, hematuria, incontinence, suspicious skin lesions, transient blindness, depression, unusual weight change, abnormal bleeding, enlarged lymph nodes, angioedema, and breast masses.    Past Medical History  Diagnosis Date  . Coronary atherosclerosis of unspecified type of vessel, native or graft   . Unspecified essential hypertension   . Pure hypercholesterolemia     IIA  . Chronic airway obstruction, not elsewhere classified   .  Osteoarthrosis, unspecified whether generalized or localized, unspecified site   . Benign prostatic hypertrophy     hx   History reviewed. No pertinent past surgical history.    Medications:  HOME MEDS: Prior to Admission medications   Medication Sig Start Date End Date Taking? Authorizing Provider  amLODipine-valsartan (EXFORGE) 10-320 MG per tablet Take 1 tablet by mouth daily.   Yes Historical Provider, MD  aspirin 325 MG buffered tablet Take 325 mg by mouth daily.   Yes Historical Provider, MD  diazepam (VALIUM) 5 MG tablet Take 5 mg by mouth every 6 (six) hours as needed for anxiety. For anxiety   Yes Historical Provider, MD  Fluticasone-Salmeterol (ADVAIR) 250-50 MCG/DOSE AEPB Inhale 1 puff into the lungs daily as needed.   Yes Historical Provider, MD  Linaclotide (LINZESS) 145 MCG CAPS Take 145 mcg by mouth daily.   Yes Historical Provider, MD  losartan (COZAAR) 100 MG tablet Take 100 mg by mouth daily.   Yes Historical Provider, MD  omega-3 acid ethyl esters (LOVAZA) 1 G capsule Take 2 g by mouth 2 (two) times daily.   Yes Historical Provider, MD  tamsulosin (FLOMAX) 0.4 MG CAPS Take 0.4 mg by mouth daily.   Yes Historical Provider, MD  tiotropium (SPIRIVA HANDIHALER) 18 MCG inhalation capsule Place 18 mcg into inhaler and inhale daily.   Yes Historical Provider, MD  traMADol (ULTRAM) 50 MG tablet Take 50 mg by mouth every 6 (six) hours as needed for pain. For pain   Yes Historical Provider, MD  valsartan-hydrochlorothiazide (DIOVAN-HCT) 320-12.5 MG per tablet Take 1 tablet  by mouth daily.   Yes Historical Provider, MD    Allergies:  No Known Allergies  Social History:   reports that he has quit smoking. He does not have any smokeless tobacco history on file. He reports that  drinks alcohol. His drug history is not on file.  Family History: Family History  Problem Relation Age of Onset  . Coronary artery disease      family hx     Physical Exam:  GEN:  Pleasant and  mildly confused Elderly Obese Caucasian Male examined  and in no acute distress; cooperative with exam Filed Vitals:   03/06/13 1633 03/06/13 1634 03/06/13 1924 03/06/13 1956  BP: 164/84   152/78  Temp:    101.2 F (38.4 C)  TempSrc:   Oral Rectal  Resp:    18  Height:      Weight:      SpO2:  98%     Blood pressure 152/78, temperature 101.2 F (38.4 C), temperature source Rectal, resp. rate 18, height 5\' 11"  (1.803 m), weight 90.719 kg (200 lb), SpO2 98.00%. PSYCH: He is alert and oriented x 3; does not appear anxious does not appear depressed; affect is normal HEENT: Normocephalic and Atraumatic, Mucous membranes pink; PERRLA; EOM intact; Fundi:  Benign;  No scleral icterus, Nares: Patent, Oropharynx: Clear, Upper Denture only  Bottom Teeth with Fair Dentition, Neck:  FROM, no cervical lymphadenopathy nor thyromegaly or carotid bruit; no JVD; Breasts:: Not examined CHEST WALL: No tenderness CHEST: Normal respiration, clear to auscultation bilaterally HEART: Regular rate and rhythm; no murmurs rubs or gallops BACK: No kyphosis or scoliosis; no CVA tenderness ABDOMEN: Positive Bowel Sounds, Obese, soft non-tender; no masses, no organomegaly.    Rectal Exam: Not done EXTREMITIES: No cyanosis, clubbing or edema; no ulcerations. Genitalia: not examined PULSES: 2+ and symmetric SKIN: Normal hydration no rash or ulceration CNS: Cranial nerves 2-12 grossly intact no focal neurologic deficit   Labs & Imaging Results for orders placed during the hospital encounter of 03/06/13 (from the past 48 hour(s))  CBC WITH DIFFERENTIAL     Status: Abnormal   Collection Time    03/06/13  4:51 PM      Result Value Range   WBC 8.9  4.0 - 10.5 K/uL   RBC 5.22  4.22 - 5.81 MIL/uL   Hemoglobin 15.3  13.0 - 17.0 g/dL   HCT 62.1  30.8 - 65.7 %   MCV 82.6  78.0 - 100.0 fL   MCH 29.3  26.0 - 34.0 pg   MCHC 35.5  30.0 - 36.0 g/dL   RDW 84.6  96.2 - 95.2 %   Platelets 136 (*) 150 - 400 K/uL    Neutrophils Relative 80 (*) 43 - 77 %   Neutro Abs 7.1  1.7 - 7.7 K/uL   Lymphocytes Relative 13  12 - 46 %   Lymphs Abs 1.1  0.7 - 4.0 K/uL   Monocytes Relative 7  3 - 12 %   Monocytes Absolute 0.6  0.1 - 1.0 K/uL   Eosinophils Relative 0  0 - 5 %   Eosinophils Absolute 0.0  0.0 - 0.7 K/uL   Basophils Relative 0  0 - 1 %   Basophils Absolute 0.0  0.0 - 0.1 K/uL  COMPREHENSIVE METABOLIC PANEL     Status: Abnormal   Collection Time    03/06/13  4:51 PM      Result Value Range   Sodium 134 (*) 135 - 145 mEq/L  Potassium 4.7  3.5 - 5.1 mEq/L   Chloride 96  96 - 112 mEq/L   CO2 27  19 - 32 mEq/L   Glucose, Bld 92  70 - 99 mg/dL   BUN 16  6 - 23 mg/dL   Creatinine, Ser 0.45 (*) 0.50 - 1.35 mg/dL   Calcium 9.3  8.4 - 40.9 mg/dL   Total Protein 6.6  6.0 - 8.3 g/dL   Albumin 3.8  3.5 - 5.2 g/dL   AST 14  0 - 37 U/L   ALT 12  0 - 53 U/L   Alkaline Phosphatase 51  39 - 117 U/L   Total Bilirubin 1.1  0.3 - 1.2 mg/dL   GFR calc non Af Amer 46 (*) >90 mL/min   GFR calc Af Amer 53 (*) >90 mL/min   Comment:            The eGFR has been calculated     using the CKD EPI equation.     This calculation has not been     validated in all clinical     situations.     eGFR's persistently     <90 mL/min signify     possible Chronic Kidney Disease.  ETHANOL     Status: None   Collection Time    03/06/13  4:51 PM      Result Value Range   Alcohol, Ethyl (B) <11  0 - 11 mg/dL   Comment:            LOWEST DETECTABLE LIMIT FOR     SERUM ALCOHOL IS 11 mg/dL     FOR MEDICAL PURPOSES ONLY  URINALYSIS, ROUTINE W REFLEX MICROSCOPIC     Status: Abnormal   Collection Time    03/06/13  7:24 PM      Result Value Range   Color, Urine YELLOW  YELLOW   APPearance CLEAR  CLEAR   Specific Gravity, Urine 1.017  1.005 - 1.030   pH 6.5  5.0 - 8.0   Glucose, UA NEGATIVE  NEGATIVE mg/dL   Hgb urine dipstick SMALL (*) NEGATIVE   Bilirubin Urine NEGATIVE  NEGATIVE   Ketones, ur NEGATIVE  NEGATIVE mg/dL    Protein, ur NEGATIVE  NEGATIVE mg/dL   Urobilinogen, UA 1.0  0.0 - 1.0 mg/dL   Nitrite NEGATIVE  NEGATIVE   Leukocytes, UA NEGATIVE  NEGATIVE  URINE MICROSCOPIC-ADD ON     Status: None   Collection Time    03/06/13  7:24 PM      Result Value Range   Squamous Epithelial / LPF RARE  RARE   WBC, UA 0-2  <3 WBC/hpf   RBC / HPF 7-10  <3 RBC/hpf   Bacteria, UA RARE  RARE  GRAM STAIN     Status: None   Collection Time    03/06/13 10:40 PM      Result Value Range   Specimen Description CSF     Special Requests Normal     Gram Stain       Value: CYTOSPIN SLIDE     WBC PRESENT,BOTH PMN AND MONONUCLEAR     NO ORGANISMS SEEN   Report Status 03/06/2013 FINAL    GLUCOSE, CSF     Status: None   Collection Time    03/06/13 10:40 PM      Result Value Range   Glucose, CSF 51  43 - 76 mg/dL  PROTEIN, CSF     Status: Abnormal   Collection Time  03/06/13 10:40 PM      Result Value Range   Total  Protein, CSF 68 (*) 15 - 45 mg/dL  CSF CELL COUNT WITH DIFFERENTIAL     Status: Abnormal   Collection Time    03/06/13 10:40 PM      Result Value Range   Tube # 4     Color, CSF COLORLESS  COLORLESS   Appearance, CSF CLEAR  CLEAR   Supernatant NOT INDICATED     RBC Count, CSF 3 (*) 0 /cu mm   WBC, CSF 0  0 - 5 /cu mm   Segmented Neutrophils-CSF TOO FEW TO COUNT, SMEAR AVAILABLE FOR REVIEW  0 - 6 %   Lymphs, CSF RARE  40 - 80 %   Monocyte-Macrophage-Spinal Fluid RARE  15 - 45 %  CSF CELL COUNT WITH DIFFERENTIAL     Status: Abnormal   Collection Time    03/06/13 10:40 PM      Result Value Range   Tube # 1     Color, CSF COLORLESS  COLORLESS   Appearance, CSF CLEAR  CLEAR   Supernatant NOT INDICATED     RBC Count, CSF 49 (*) 0 /cu mm   WBC, CSF 1  0 - 5 /cu mm   Segmented Neutrophils-CSF TOO FEW TO COUNT, SMEAR AVAILABLE FOR REVIEW  0 - 6 %   Comment: RARE   Lymphs, CSF RARE  40 - 80 %   Monocyte-Macrophage-Spinal Fluid RARE  15 - 45 %     Cardiac Enzymes: No results found for this  basename: CKTOTAL, CKMB, CKMBINDEX, TROPONINI,  in the last 168 hours  BNP (last 3 results) No results found for this basename: PROBNP,  in the last 8760 hours CBG: No results found for this basename: GLUCAP,  in the last 168 hours  Radiological Exams on Admission: Dg Chest 2 View  03/06/2013  *RADIOLOGY REPORT*  Clinical Data: Altered mental status.  Ex-smoker.  CHEST - 2 VIEW  Comparison: None.  Findings: Severe bilateral glenohumeral joint osteoarthritis. Patient rotated right.  Mild cardiomegaly with a tortuous thoracic aorta. No pleural effusion or pneumothorax.  No congestive failure.  Low lung volumes with resultant pulmonary interstitial prominence.  IMPRESSION: Cardiomegaly and low lung volumes, without acute disease.   Original Report Authenticated By: Jeronimo Greaves, M.D.    Ct Head Wo Contrast  03/06/2013  *RADIOLOGY REPORT*  Clinical Data: Altered mental status.  CT HEAD WITHOUT CONTRAST  Technique:  Contiguous axial images were obtained from the base of the skull through the vertex without contrast.  Comparison: None.  Findings: Bone windows demonstrate clear paranasal sinuses and mastoid air cells.  Soft tissue windows demonstrate mildly advanced cerebral atrophy. Mild ventriculomegaly, felt to be secondary.  Cerebellar atrophy as well.  No  mass lesion, hemorrhage, hydrocephalus, acute infarct, intra- axial, or extra-axial fluid collection.  IMPRESSION:  1. No acute intracranial abnormality. 2.  Advanced cerebral and cerebellar atrophy.  Mild ventriculomegaly is felt to be secondary.   Original Report Authenticated By: Jeronimo Greaves, M.D.    Dg Lumbar Puncture Fluoro Guide  03/06/2013  *RADIOLOGY REPORT*  Clinical Data:  Altered mental status, evaluate for meningitis. Unsuccessful attempted bedside lumbar puncture in the emergency department.  DIAGNOSTIC LUMBAR PUNCTURE UNDER FLUOROSCOPIC GUIDANCE  Fluoroscopy time:  0.5 minutes.  Technique:  Informed consent was obtained from the patient  prior to the procedure, including potential complications of headache, allergy, and pain.   With the patient prone, the lower back  was prepped with Betadine.  1% Lidocaine was used for local anesthesia. Lumbar puncture was performed at the L2-L3 level using a 20 gauge needle with return of clear CSF with an opening pressure of 18.5 cm water.   11.5 ml of CSF were obtained for laboratory studies.  The patient tolerated the procedure well and there were no apparent complications.  IMPRESSION:  Technically successful lumbar puncture under fluoroscopic guidance.   Original Report Authenticated By: Malachy Moan, M.D.     EKG: Independently reviewed.   Assessment/Plan Principal Problem:   Fever Active Problems:   Acute encephalopathy   Weakness generalized   HYPERCHOLESTEROLEMIA  IIA   HYPERTENSION, UNSPECIFIED   CAD, UNSPECIFIED SITE   COPD   1.   Fever-  Blood Cultures Sent,  Broad Spectrum IV Antibiotics started with Vancomycin and Zosyn, and Oral Tamiflu.     2.  Acute Encephalopathy- Neuro checks  3.   Weakness- Neuro checks.    4.   HTN- stable on Medications:   On Exforge, and Diovan.      5.  Hyperlipidemia- Continue Lovaza.    6.  CAD-   Stable continue   7.  COPD-   Does Not appear to be in an exacerbation, continue Advair, and spirvia inhalers and Nebs PRN, and O2 PRN.     8.  DVT Prophylaxis with Lovenox.    9.  Other-   Reconcile Home Medications.        Code Status:     FULL CODE Family Communication:      Daughter at Bedside Disposition Plan:     Return to Home on discharge.     Time spent:   60 Minutes  Ron Parker [facility] Pager [phone]  If 7PM-7AM, please contact night-coverage [email or web address] Password [email or web address] 03/07/2013, 1:17 AM

## 2013-03-08 LAB — BASIC METABOLIC PANEL
BUN: 18 mg/dL (ref 6–23)
CO2: 24 mEq/L (ref 19–32)
Calcium: 8.8 mg/dL (ref 8.4–10.5)
Creatinine, Ser: 1.27 mg/dL (ref 0.50–1.35)
Glucose, Bld: 95 mg/dL (ref 70–99)

## 2013-03-08 LAB — CBC
Hemoglobin: 14.6 g/dL (ref 13.0–17.0)
MCH: 29 pg (ref 26.0–34.0)
MCV: 82.1 fL (ref 78.0–100.0)
RBC: 5.03 MIL/uL (ref 4.22–5.81)

## 2013-03-08 MED ORDER — VITAMIN B-1 100 MG PO TABS
100.0000 mg | ORAL_TABLET | Freq: Every day | ORAL | Status: DC
  Administered 2013-03-08: 100 mg via ORAL
  Filled 2013-03-08: qty 1

## 2013-03-08 MED ORDER — FOLIC ACID 1 MG PO TABS
1.0000 mg | ORAL_TABLET | Freq: Every day | ORAL | Status: DC
  Administered 2013-03-08: 1 mg via ORAL
  Filled 2013-03-08: qty 1

## 2013-03-08 MED ORDER — LORAZEPAM 1 MG PO TABS
1.0000 mg | ORAL_TABLET | Freq: Four times a day (QID) | ORAL | Status: DC | PRN
  Administered 2013-03-08: 1 mg via ORAL
  Filled 2013-03-08: qty 1

## 2013-03-08 MED ORDER — ADULT MULTIVITAMIN W/MINERALS CH
1.0000 | ORAL_TABLET | Freq: Every day | ORAL | Status: DC
  Administered 2013-03-08 – 2013-03-10 (×3): 1 via ORAL
  Filled 2013-03-08 (×3): qty 1

## 2013-03-08 MED ORDER — LORAZEPAM 2 MG/ML IJ SOLN
0.5000 mg | Freq: Once | INTRAMUSCULAR | Status: AC
  Administered 2013-03-08: 0.5 mg via INTRAVENOUS
  Filled 2013-03-08: qty 1

## 2013-03-08 MED ORDER — LORAZEPAM 2 MG/ML IJ SOLN: 1.0000 mg | Freq: Four times a day (QID) | INTRAMUSCULAR | Status: DC | PRN

## 2013-03-08 MED ORDER — LEVOFLOXACIN 750 MG PO TABS
750.0000 mg | ORAL_TABLET | Freq: Every day | ORAL | Status: AC
  Administered 2013-03-08 – 2013-03-10 (×3): 750 mg via ORAL
  Filled 2013-03-08 (×3): qty 1

## 2013-03-08 MED ORDER — FOLIC ACID 1 MG PO TABS
1.0000 mg | ORAL_TABLET | Freq: Every day | ORAL | Status: DC
  Administered 2013-03-08 – 2013-03-10 (×3): 1 mg via ORAL
  Filled 2013-03-08 (×3): qty 1

## 2013-03-08 MED ORDER — THIAMINE HCL 100 MG/ML IJ SOLN
100.0000 mg | Freq: Every day | INTRAMUSCULAR | Status: DC
  Filled 2013-03-08 (×3): qty 1

## 2013-03-08 MED ORDER — VITAMIN B-1 100 MG PO TABS
100.0000 mg | ORAL_TABLET | Freq: Every day | ORAL | Status: DC
  Administered 2013-03-08 – 2013-03-10 (×3): 100 mg via ORAL
  Filled 2013-03-08 (×4): qty 1

## 2013-03-08 MED ORDER — HALOPERIDOL LACTATE 5 MG/ML IJ SOLN
1.0000 mg | Freq: Three times a day (TID) | INTRAMUSCULAR | Status: DC | PRN
  Administered 2013-03-08: 1 mg via INTRAVENOUS
  Filled 2013-03-08: qty 1

## 2013-03-08 NOTE — Progress Notes (Addendum)
ANTIBIOTIC CONSULT NOTE  Pharmacy Consult for levofloxacin Indication: rule out pneumonia  No Known Allergies  Patient Measurements: Height: 5\' 11"  (180.3 cm) Weight: 200 lb (90.719 kg) IBW/kg (Calculated) : 75.3  Vital Signs: Temp: 98.5 F (36.9 C) (03/09 0709) BP: 152/98 mmHg (03/09 0709) Pulse Rate: 57 (03/09 0709) Intake/Output from previous day: 03/08 0701 - 03/09 0700 In: 4 [P.O.:4] Out: 1450 [Urine:1450] Intake/Output from this shift: Total I/O In: -  Out: 1300 [Urine:1300]  Labs:  Recent Labs  03/06/13 1651 03/07/13 0600 03/08/13 0550  WBC 8.9 4.7 5.2  HGB 15.3 13.8 14.6  PLT 136* 126* 132*  CREATININE 1.38* 1.40* 1.27   Estimated Creatinine Clearance: 51.7 ml/min (by C-G formula based on Cr of 1.27). No results found for this basename: VANCOTROUGH, Leodis Binet, VANCORANDOM, GENTTROUGH, GENTPEAK, GENTRANDOM, TOBRATROUGH, TOBRAPEAK, TOBRARND, AMIKACINPEAK, AMIKACINTROU, AMIKACIN,  in the last 72 hours   Microbiology: Recent Results (from the past 720 hour(s))  URINE CULTURE     Status: None   Collection Time    03/06/13  7:24 PM      Result Value Range Status   Specimen Description URINE, CATHETERIZED   Final   Special Requests NONE   Final   Culture  Setup Time 03/06/2013 20:27   Final   Colony Count 10,000 COLONIES/ML   Final   Culture     Final   Value: Multiple bacterial morphotypes present, none predominant. Suggest appropriate recollection if clinically indicated.   Report Status 03/07/2013 FINAL   Final  CSF CULTURE     Status: None   Collection Time    03/06/13 10:40 PM      Result Value Range Status   Specimen Description CSF   Final   Special Requests Normal   Final   Gram Stain     Final   Value: CYTOSPIN SLIDE WBC PRESENT,BOTH PMN AND MONONUCLEAR     NO ORGANISMS SEEN     Performed at [facility]   Culture PENDING   Incomplete   Report Status PENDING   Incomplete  GRAM STAIN     Status: None   Collection Time    03/06/13  10:40 PM      Result Value Range Status   Specimen Description CSF   Final   Special Requests Normal   Final   Gram Stain     Final   Value: CYTOSPIN SLIDE     WBC PRESENT,BOTH PMN AND MONONUCLEAR     NO ORGANISMS SEEN   Report Status 03/06/2013 FINAL   Final    Medical History: Past Medical History  Diagnosis Date  . Coronary atherosclerosis of unspecified type of vessel, native or graft   . Unspecified essential hypertension   . Pure hypercholesterolemia     IIA  . Chronic airway obstruction, not elsewhere classified   . Osteoarthrosis, unspecified whether generalized or localized, unspecified site   . Benign prostatic hypertrophy     hx    Medications:  Scheduled:  . amLODipine  5 mg Oral Daily  . aspirin EC  325 mg Oral Daily  . enoxaparin (LOVENOX) injection  40 mg Subcutaneous Daily  . folic acid  1 mg Oral Daily  . [COMPLETED] LORazepam  0.5 mg Intravenous Once  . losartan  25 mg Oral Daily  . mometasone-formoterol  2 puff Inhalation BID  . omega-3 acid ethyl esters  2 g Oral BID  . tamsulosin  0.4 mg Oral Daily  . thiamine  100 mg Oral Daily  .  tiotropium  18 mcg Inhalation Daily  . [DISCONTINUED] aspirin  325 mg Oral Daily  . [DISCONTINUED] oseltamivir  75 mg Oral BID  . [DISCONTINUED] piperacillin-tazobactam (ZOSYN)  IV  3.375 g Intravenous Q8H  . [DISCONTINUED] vancomycin  1,250 mg Intravenous Q24H   Assessment: 79 yo male presented to ED with confusion and weakness.  MD ordered abx change from vanc/zosyn to levofloxacin today for 3 more oral doses.  Goal of Therapy: Eradication of infection  Plan:  - Levofloxacin 750 mg po q24h x 3  - Follow up SCr, UOP, cultures, clinical course and adjust as clinically indicated  Alison L. Illene Bolus, PharmD, BCPS Clinical Pharmacist Pager: [phone] Pharmacy: [phone] 03/08/2013 12:51 PM

## 2013-03-08 NOTE — Progress Notes (Signed)
Had a lengthy conversation with pts wife and we went over some of the things he takes/does at home.  She states that he takes valium 5 mg 1-2 times daily for trembling/anxiety, takes ultram 50 mg. 4 times a day for his arthritis, and drinks 4 oz (occasionally a double) of vodka at night when he is "tore up."  This is usually done 4 times a week according to his wife.  She states he has a high strung personality and that things upset him easily.  I reviewed the plan of care with the wife.

## 2013-03-08 NOTE — Progress Notes (Signed)
[facility] PROGRESS NOTE  KOLESON REIFSTECK ZOX:[id number] DOB: Apr 30, 1928 DOA: 03/06/2013 PCP: Evelene Croon, MD  Assessment/Plan: Fever Afebrile since admission.  Blood cultures unfortunately were not sent. Urine culture showed multiple bacterial morphotypes.  CSF culture negative.  Discontinue empiric broad-spectrum antibiotics with vancomycin and Zosyn, transitioned to levofloxacin. Chest x-ray not suggestive of pneumonia in patient is not endorsing any respiratory symptoms.  Influenza panel negative, discontinue Tamiflu.    Acute encephalopathy/altered mental status/acute delirium Etiology unclear.  Head CT negative for any acute intracranial abnormality.  MRI of the brain negative.  Lumbar puncture on 03/06/2013, tube 4, 0 WBCs, CSF glucose 51.  Will add CSF HSV PCR.  Family reports that patient did accidentally take his Friday's and Saturday's dose of his medications on 03/06/2013.  Will get a EEG for further evaluation. There was question whether patient was consuming significant amount of alcohol at home.  He does admit to drinking one shot glass of vodka daily, which may not be enough to cause withdrawal.  Continue to monitor carefully, initiate CIWA protocol.  Start thiamine and folic acid.  Sitter at bedside for safety.  Generalized weakness May be related to fever and altered mental status.  Will request physical therapy evaluation.  HTN Restart home medications, start losartan at lower dose.  Amlodipine at lower dose.  Hold valsartan/hydrochlorothiazide.  Hyperlipidemia Continue Lovaza.   CAD Stable continue aspirin.  COPD Stable.  Currently not in exacerbation.  Continue Advair, Spiriva, and treatments.  Thrombocytopenia Stable.  Continue to monitor.  Prophylaxis Lovenox.  Code Status: Full code. Family Communication: Family by bedside. Disposition Plan: Pending.  Consultants:  None.  Procedures:  LP on 03/06/2013  Antibiotics:  Vancomycin  03/07/2013 >> 03/08/2013  Zosyn 03/07/2013 >> 03/08/2013  Oseltamivir 03/07/2013 >> 03/08/2013  Levofloxacin 03/08/2013 >>  HPI/Subjective: Patient was agitated and confused during the night and had to be restrained.  Mentation improved this morning.  Objective: Filed Vitals:   03/07/13 2023 03/07/13 2236 03/08/13 0157 03/08/13 0709  BP:  173/84 181/96 152/98  Pulse: 70 64 62 57  Temp:  97.5 F (36.4 C) 98.2 F (36.8 C) 98.5 F (36.9 C)  TempSrc:  Oral    Resp: 18 19 19 21   Height:      Weight:      SpO2: 98% 94% 95% 94%    Intake/Output Summary (Last 24 hours) at 03/08/13 1227 Last data filed at 03/08/13 0702  Gross per 24 hour  Intake      4 ml  Output   2450 ml  Net  -2446 ml   Filed Weights   03/06/13 1631  Weight: 90.719 kg (200 lb)    Exam: Physical Exam: General: Awake, Oriented to self, location, month, and year, No acute distress. HEENT: EOMI. Neck: Supple CV: S1 and S2 Lungs: Clear to ascultation bilaterally Abdomen: Soft, Nontender, Nondistended, +bowel sounds. Ext: Good pulses. Trace edema.  Data Reviewed: Basic Metabolic Panel:  Recent Labs Lab 03/06/13 1651 03/07/13 0600 03/08/13 0550  NA 134* 134* 137  K 4.7 4.0 3.9  CL 96 99 100  CO2 27 24 24   GLUCOSE 92 83 95  BUN 16 18 18   CREATININE 1.38* 1.40* 1.27  CALCIUM 9.3 8.5 8.8   Liver Function Tests:  Recent Labs Lab 03/06/13 1651  AST 14  ALT 12  ALKPHOS 51  BILITOT 1.1  PROT 6.6  ALBUMIN 3.8   No results found for this basename: LIPASE, AMYLASE,  in the last 168 hours No  results found for this basename: AMMONIA,  in the last 168 hours CBC:  Recent Labs Lab 03/06/13 1651 03/07/13 0600 03/08/13 0550  WBC 8.9 4.7 5.2  NEUTROABS 7.1  --   --   HGB 15.3 13.8 14.6  HCT 43.1 39.3 41.3  MCV 82.6 82.4 82.1  PLT 136* 126* 132*   Cardiac Enzymes: No results found for this basename: CKTOTAL, CKMB, CKMBINDEX, TROPONINI,  in the last 168 hours BNP (last 3 results) No results  found for this basename: PROBNP,  in the last 8760 hours CBG: No results found for this basename: GLUCAP,  in the last 168 hours  Recent Results (from the past 240 hour(s))  URINE CULTURE     Status: None   Collection Time    03/06/13  7:24 PM      Result Value Range Status   Specimen Description URINE, CATHETERIZED   Final   Special Requests NONE   Final   Culture  Setup Time 03/06/2013 20:27   Final   Colony Count 10,000 COLONIES/ML   Final   Culture     Final   Value: Multiple bacterial morphotypes present, none predominant. Suggest appropriate recollection if clinically indicated.   Report Status 03/07/2013 FINAL   Final  CSF CULTURE     Status: None   Collection Time    03/06/13 10:40 PM      Result Value Range Status   Specimen Description CSF   Final   Special Requests Normal   Final   Gram Stain     Final   Value: CYTOSPIN SLIDE WBC PRESENT,BOTH PMN AND MONONUCLEAR     NO ORGANISMS SEEN     Performed at [facility]   Culture PENDING   Incomplete   Report Status PENDING   Incomplete  GRAM STAIN     Status: None   Collection Time    03/06/13 10:40 PM      Result Value Range Status   Specimen Description CSF   Final   Special Requests Normal   Final   Gram Stain     Final   Value: CYTOSPIN SLIDE     WBC PRESENT,BOTH PMN AND MONONUCLEAR     NO ORGANISMS SEEN   Report Status 03/06/2013 FINAL   Final     Studies: Dg Chest 2 View  03/06/2013  *RADIOLOGY REPORT*  Clinical Data: Altered mental status.  Ex-smoker.  CHEST - 2 VIEW  Comparison: None.  Findings: Severe bilateral glenohumeral joint osteoarthritis. Patient rotated right.  Mild cardiomegaly with a tortuous thoracic aorta. No pleural effusion or pneumothorax.  No congestive failure.  Low lung volumes with resultant pulmonary interstitial prominence.  IMPRESSION: Cardiomegaly and low lung volumes, without acute disease.   Original Report Authenticated By: Jeronimo Greaves, M.D.    Ct Head Wo Contrast  03/06/2013   *RADIOLOGY REPORT*  Clinical Data: Altered mental status.  CT HEAD WITHOUT CONTRAST  Technique:  Contiguous axial images were obtained from the base of the skull through the vertex without contrast.  Comparison: None.  Findings: Bone windows demonstrate clear paranasal sinuses and mastoid air cells.  Soft tissue windows demonstrate mildly advanced cerebral atrophy. Mild ventriculomegaly, felt to be secondary.  Cerebellar atrophy as well.  No  mass lesion, hemorrhage, hydrocephalus, acute infarct, intra- axial, or extra-axial fluid collection.  IMPRESSION:  1. No acute intracranial abnormality. 2.  Advanced cerebral and cerebellar atrophy.  Mild ventriculomegaly is felt to be secondary.   Original Report Authenticated By: Ronaldo Miyamoto  Reche Dixon, M.D.    Mr Brain Wo Contrast  03/07/2013  *RADIOLOGY REPORT*  Clinical Data: Confusion  MRI HEAD WITHOUT CONTRAST  Technique:  Multiplanar, multiecho pulse sequences of the brain and surrounding structures were obtained according to standard protocol without intravenous contrast.  Comparison: CT 03/06/2013  Findings: Generalized atrophy with prominent ventricles, unchanged. Negative for acute infarct.  No significant chronic ischemia.  Negative for intracranial hemorrhage.  Negative for mass or edema. No midline shift.  IMPRESSION: Generalized atrophy.  No acute abnormality.   Original Report Authenticated By: Janeece Riggers, M.D.    Dg Lumbar Puncture Fluoro Guide  03/06/2013  *RADIOLOGY REPORT*  Clinical Data:  Altered mental status, evaluate for meningitis. Unsuccessful attempted bedside lumbar puncture in the emergency department.  DIAGNOSTIC LUMBAR PUNCTURE UNDER FLUOROSCOPIC GUIDANCE  Fluoroscopy time:  0.5 minutes.  Technique:  Informed consent was obtained from the patient prior to the procedure, including potential complications of headache, allergy, and pain.   With the patient prone, the lower back was prepped with Betadine.  1% Lidocaine was used for local anesthesia.  Lumbar puncture was performed at the L2-L3 level using a 20 gauge needle with return of clear CSF with an opening pressure of 18.5 cm water.   11.5 ml of CSF were obtained for laboratory studies.  The patient tolerated the procedure well and there were no apparent complications.  IMPRESSION:  Technically successful lumbar puncture under fluoroscopic guidance.   Original Report Authenticated By: Malachy Moan, M.D.     Scheduled Meds: . amLODipine  5 mg Oral Daily  . aspirin EC  325 mg Oral Daily  . enoxaparin (LOVENOX) injection  40 mg Subcutaneous Daily  . losartan  25 mg Oral Daily  . mometasone-formoterol  2 puff Inhalation BID  . omega-3 acid ethyl esters  2 g Oral BID  . tamsulosin  0.4 mg Oral Daily  . tiotropium  18 mcg Inhalation Daily   Continuous Infusions: . sodium chloride 75 mL/hr at 03/07/13 0228    Principal Problem:   Fever Active Problems:   HYPERCHOLESTEROLEMIA  IIA   HYPERTENSION, UNSPECIFIED   CAD, UNSPECIFIED SITE   COPD   Acute encephalopathy   Weakness generalized    Samyah Bilbo A, MD  [facility] Pager [phone]. If 7PM-7AM, please contact night-coverage at [email or web address], password [facility] 03/08/2013, 12:27 PM  LOS: 2 days

## 2013-03-08 NOTE — Progress Notes (Signed)
Pt placed in waist belt per order for safety-confused, trying to get out of bed without assistance and high fall risk.  Notified patient's spouse,Sylvia Brewton, that patient was placed in waist belt and she agreed with placing safety belt.  She stated"he's forgotten that he can't walk."  Pt administered 0.5mg  Ativan IV per order.

## 2013-03-08 NOTE — Progress Notes (Signed)
Paged on call clinician- pt stating he is going to leave hospital and he is trying to get out of bed but is barely able to sit up by himself. Pt oriented to self only at this time;yelling for "Ricky".  Pt refusing to take PO meds.  Pt telling sitter and RN to go away and leave him alone;says "what are you doing here anyway."

## 2013-03-08 NOTE — Progress Notes (Signed)
Spoke with Dr Enedina Finner and he confirmed OK to take pt off isolation.

## 2013-03-09 ENCOUNTER — Inpatient Hospital Stay (HOSPITAL_COMMUNITY): Payer: Medicare Other

## 2013-03-09 DIAGNOSIS — R4182 Altered mental status, unspecified: Secondary | ICD-10-CM

## 2013-03-09 DIAGNOSIS — G9340 Encephalopathy, unspecified: Secondary | ICD-10-CM

## 2013-03-09 LAB — BASIC METABOLIC PANEL
CO2: 25 mEq/L (ref 19–32)
Calcium: 9 mg/dL (ref 8.4–10.5)
Creatinine, Ser: 1.19 mg/dL (ref 0.50–1.35)
Glucose, Bld: 99 mg/dL (ref 70–99)
Sodium: 139 mEq/L (ref 135–145)

## 2013-03-09 LAB — CBC
Hemoglobin: 14.4 g/dL (ref 13.0–17.0)
MCH: 29 pg (ref 26.0–34.0)
MCV: 81.7 fL (ref 78.0–100.0)
RBC: 4.97 MIL/uL (ref 4.22–5.81)

## 2013-03-09 MED ORDER — LOSARTAN POTASSIUM 50 MG PO TABS
50.0000 mg | ORAL_TABLET | Freq: Every day | ORAL | Status: DC
  Administered 2013-03-10: 50 mg via ORAL
  Filled 2013-03-09: qty 1

## 2013-03-09 NOTE — Evaluation (Signed)
Physical Therapy Evaluation Patient Details Name: Terry Taylor MRN: [id number] DOB: October 01, 1928 Today's Date: 03/09/2013 Time: 4098-1191 PT Time Calculation (min): 32 min  PT Assessment / Plan / Recommendation Clinical Impression  Pt is an 79 yo male admitted with fever and generalized weaknes. Pt was very active and indpendent PTA, working on his horse farm. Pt now with generalized deconditioning, mild balance impairments, and increased falls risk. Pt to benefit from use of RW and 24/7 supervision upon d/c. Son agreeable and with verbal understanding of recommendations.    PT Assessment  Patient needs continued PT services    Follow Up Recommendations  No PT follow up;Supervision/Assistance - 24 hour    Does the patient have the potential to tolerate intense rehabilitation      Barriers to Discharge None      Equipment Recommendations  Rolling walker with 5" wheels    Recommendations for Other Services     Frequency Min 3X/week    Precautions / Restrictions Precautions Precautions: Fall Restrictions Weight Bearing Restrictions: No   Pertinent Vitals/Pain Pt denies pain at this time      Mobility  Bed Mobility Bed Mobility: Supine to Sit Supine to Sit: 6: Modified independent (Device/Increase time);HOB flat Details for Bed Mobility Assistance: increased time Transfers Transfers: Sit to Stand;Stand to Sit Sit to Stand: 5: Supervision Stand to Sit: 6: Modified independent (Device/Increase time) Details for Transfer Assistance: pt with safe technique Ambulation/Gait Ambulation/Gait Assistance: 4: Min guard;5: Supervision Ambulation Distance (Feet): 150 Feet Assistive device: Rolling walker;None Ambulation/Gait Assistance Details: pt mildy unsteady without RW with minor episodes of LOB however able to recover independenly. pt with antalgic L LE limp due to arthritis in L knee....pt reports "It catches a lot." Pt with increased stability with RW, pt agrees. Pt with  no limp or LOB when amb with RW.  Gait Pattern: Step-through pattern;Wide base of support;Antalgic;Decreased stride length (pt with more longer step length and fluidity with RW) Gait velocity: wfl Stairs: Yes Stairs Assistance: 4: Min guard Stair Management Technique: One rail Left;Forwards Number of Stairs: 4 (limited by IV line)    Exercises     PT Diagnosis: Generalized weakness;Difficulty walking  PT Problem List: Decreased activity tolerance;Decreased balance;Decreased mobility;Decreased knowledge of use of DME PT Treatment Interventions: DME instruction;Gait training;Stair training;Functional mobility training;Balance training;Therapeutic exercise;Therapeutic activities   PT Goals Acute Rehab PT Goals PT Goal Formulation: With patient Time For Goal Achievement: 03/23/13 Potential to Achieve Goals: Good Pt will go Supine/Side to Sit: Independently PT Goal: Supine/Side to Sit - Progress: Goal set today Pt will go Sit to Supine/Side: Independently PT Goal: Sit to Supine/Side - Progress: Goal set today Pt will go Sit to Stand: Independently;with upper extremity assist PT Goal: Sit to Stand - Progress: Goal set today Pt will Ambulate: >150 feet;with modified independence;with least restrictive assistive device PT Goal: Ambulate - Progress: Goal set today Pt will Go Up / Down Stairs: Flight;with modified independence;with rail(s) PT Goal: Up/Down Stairs - Progress: Goal set today Additional Goals Additional Goal #1: pt to score >19 on DGI to indicate decreased falls risk PT Goal: Additional Goal #1 - Progress: Goal set today  Visit Information  Last PT Received On: 03/09/13 Assistance Needed: +1    Subjective Data  Subjective: Pt received sitting up in bed eating breakfast, agreeable to PT. 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 Prior Function Level of Independence: Independent Able to Take Stairs?: Yes Driving: Yes Vocation:  (works on horse farm) Communication Communication: HOH Dominant Hand: Right    Cognition  Cognition Overall Cognitive Status: Appears within functional limits for tasks assessed/performed Arousal/Alertness: Awake/alert Orientation Level: Oriented X4 / Intact Behavior During Session: WFL for tasks performed    Extremity/Trunk Assessment Right Upper Extremity Assessment RUE ROM/Strength/Tone: Within functional levels Left Upper Extremity Assessment LUE ROM/Strength/Tone: Within functional levels Right Lower Extremity Assessment RLE ROM/Strength/Tone: Within functional levels Left Lower Extremity Assessment LLE ROM/Strength/Tone: Within functional levels Trunk Assessment Trunk Assessment: Kyphotic (forward head)   Balance    End of Session PT - End of Session Equipment Utilized During Treatment: Gait belt Activity Tolerance: Patient tolerated treatment well Patient left: in chair;with call bell/phone within reach;with family/visitor present Nurse Communication: Mobility status (ambulate with pt later today)  GP     Marcene Brawn 03/09/2013, 9:48 AM  Lewis Shock, PT, DPT Pager #: [phone] Office #: [phone]

## 2013-03-09 NOTE — Progress Notes (Signed)
[facility] PROGRESS NOTE  Terry Taylor JXB:[id number] DOB: Mar 26, 1928 DOA: 03/06/2013 PCP: Evelene Croon, MD  Assessment/Plan: Fever Afebrile since admission.  Blood cultures unfortunately were not sent. Urine culture showed multiple bacterial morphotypes.  CSF culture negative.  Discontinue empiric broad-spectrum antibiotics with vancomycin and Zosyn, transitioned to levofloxacin. Chest x-ray not suggestive of pneumonia, and patient is not endorsing any respiratory symptoms.  Influenza panel negative, discontinued Tamiflu.    Acute encephalopathy/altered mental status/acute delirium Etiology unclear.  Head CT negative for any acute intracranial abnormality.  MRI of the brain negative.  Lumbar puncture on 03/06/2013, tube 4, 0 WBCs, CSF glucose 51.  CSF HSV PCR pending.  Family reports that patient did accidentally take his Friday's and Saturday's dose of his medications on 03/06/2013.  EEG of the brain done, results pending.  There was question whether patient was consuming significant amount of alcohol at home.  He does admit to drinking one shot glass of vodka daily, which may not be enough to cause withdrawal.  Continue to monitor carefully, initiate CIWA protocol.  Start thiamine and folic acid.  Sitter at bedside for safety.  Generalized weakness May be related to fever and altered mental status.  Ambulated with PT, no PT needs.  HTN Restart home medications. Increase the dose of losartan. Continue Amlodipine.  Hold valsartan/hydrochlorothiazide.  Hyperlipidemia Continue Lovaza.   CAD Stable continue aspirin.  COPD Stable.  Currently not in exacerbation.  Continue Advair, Spiriva, and treatments.  Thrombocytopenia Stable.  Continue to monitor.  Prophylaxis Lovenox.  Code Status: Full code. Family Communication: Family by bedside. Disposition Plan: Consider possible discharge tomorrow.  Consultants:  None.  Procedures:  LP on  03/06/2013  Antibiotics:  Vancomycin 03/07/2013 >> 03/08/2013  Zosyn 03/07/2013 >> 03/08/2013  Oseltamivir 03/07/2013 >> 03/08/2013  Levofloxacin 03/08/2013 >>  HPI/Subjective: Mentation improved. Ambulating in the halls. Denies any pain. Wondering when he can go home.  Objective: Filed Vitals:   03/09/13 0705 03/09/13 0934 03/09/13 1128 03/09/13 1300  BP: 146/71  149/70 151/73  Pulse: 70   57  Temp: 97.9 F (36.6 C)   97.8 F (36.6 C)  TempSrc:    Oral  Resp: 19   20  Height:      Weight:      SpO2: 96% 95%  97%    Intake/Output Summary (Last 24 hours) at 03/09/13 1401 Last data filed at 03/09/13 1358  Gross per 24 hour  Intake      0 ml  Output    850 ml  Net   -850 ml   Filed Weights   03/06/13 1631  Weight: 90.719 kg (200 lb)    Exam: Physical Exam: General: Awake, Oriented to self, location, month, and year, No acute distress. HEENT: EOMI. Neck: Supple CV: S1 and S2 Lungs: Clear to ascultation bilaterally Abdomen: Soft, Nontender, Nondistended, +bowel sounds. Ext: Good pulses. Trace edema.  Data Reviewed: Basic Metabolic Panel:  Recent Labs Lab 03/06/13 1651 03/07/13 0600 03/08/13 0550 03/09/13 0657  NA 134* 134* 137 139  K 4.7 4.0 3.9 3.8  CL 96 99 100 104  CO2 27 24 24 25   GLUCOSE 92 83 95 99  BUN 16 18 18 14   CREATININE 1.38* 1.40* 1.27 1.19  CALCIUM 9.3 8.5 8.8 9.0   Liver Function Tests:  Recent Labs Lab 03/06/13 1651  AST 14  ALT 12  ALKPHOS 51  BILITOT 1.1  PROT 6.6  ALBUMIN 3.8   No results found for this basename: LIPASE, AMYLASE,  in the last 168 hours  Recent Labs Lab 03/09/13 0654  AMMONIA 31   CBC:  Recent Labs Lab 03/06/13 1651 03/07/13 0600 03/08/13 0550 03/09/13 0657  WBC 8.9 4.7 5.2 5.5  NEUTROABS 7.1  --   --   --   HGB 15.3 13.8 14.6 14.4  HCT 43.1 39.3 41.3 40.6  MCV 82.6 82.4 82.1 81.7  PLT 136* 126* 132* 140*   Cardiac Enzymes: No results found for this basename: CKTOTAL, CKMB, CKMBINDEX,  TROPONINI,  in the last 168 hours BNP (last 3 results) No results found for this basename: PROBNP,  in the last 8760 hours CBG: No results found for this basename: GLUCAP,  in the last 168 hours  Recent Results (from the past 240 hour(s))  URINE CULTURE     Status: None   Collection Time    03/06/13  7:24 PM      Result Value Range Status   Specimen Description URINE, CATHETERIZED   Final   Special Requests NONE   Final   Culture  Setup Time 03/06/2013 20:27   Final   Colony Count 10,000 COLONIES/ML   Final   Culture     Final   Value: Multiple bacterial morphotypes present, none predominant. Suggest appropriate recollection if clinically indicated.   Report Status 03/07/2013 FINAL   Final  CSF CULTURE     Status: None   Collection Time    03/06/13 10:40 PM      Result Value Range Status   Specimen Description CSF   Final   Special Requests Normal   Final   Gram Stain     Final   Value: CYTOSPIN SLIDE WBC PRESENT,BOTH PMN AND MONONUCLEAR     NO ORGANISMS SEEN     Performed at [facility]   Culture NO GROWTH 1 DAY   Final   Report Status PENDING   Incomplete  GRAM STAIN     Status: None   Collection Time    03/06/13 10:40 PM      Result Value Range Status   Specimen Description CSF   Final   Special Requests Normal   Final   Gram Stain     Final   Value: CYTOSPIN SLIDE     WBC PRESENT,BOTH PMN AND MONONUCLEAR     NO ORGANISMS SEEN   Report Status 03/06/2013 FINAL   Final     Studies: Mr Brain Wo Contrast  03/07/2013  *RADIOLOGY REPORT*  Clinical Data: Confusion  MRI HEAD WITHOUT CONTRAST  Technique:  Multiplanar, multiecho pulse sequences of the brain and surrounding structures were obtained according to standard protocol without intravenous contrast.  Comparison: CT 03/06/2013  Findings: Generalized atrophy with prominent ventricles, unchanged. Negative for acute infarct.  No significant chronic ischemia.  Negative for intracranial hemorrhage.  Negative for mass or  edema. No midline shift.  IMPRESSION: Generalized atrophy.  No acute abnormality.   Original Report Authenticated By: Janeece Riggers, M.D.     Scheduled Meds: . amLODipine  5 mg Oral Daily  . aspirin EC  325 mg Oral Daily  . enoxaparin (LOVENOX) injection  40 mg Subcutaneous Daily  . folic acid  1 mg Oral Daily  . levofloxacin  750 mg Oral Daily  . losartan  25 mg Oral Daily  . mometasone-formoterol  2 puff Inhalation BID  . multivitamin with minerals  1 tablet Oral Daily  . omega-3 acid ethyl esters  2 g Oral BID  . tamsulosin  0.4 mg Oral  Daily  . thiamine  100 mg Oral Daily   Or  . thiamine  100 mg Intravenous Daily  . tiotropium  18 mcg Inhalation Daily   Continuous Infusions: . sodium chloride 75 mL/hr at 03/08/13 1330    Principal Problem:   Fever Active Problems:   HYPERCHOLESTEROLEMIA  IIA   HYPERTENSION, UNSPECIFIED   CAD, UNSPECIFIED SITE   COPD   Acute encephalopathy   Weakness generalized    Laasya Peyton A, MD  [facility] Pager [phone]. If 7PM-7AM, please contact night-coverage at [email or web address], password [facility] 03/09/2013, 2:01 PM  LOS: 3 days

## 2013-03-09 NOTE — Progress Notes (Signed)
UR completed. Heather Wile RNBSN 

## 2013-03-09 NOTE — Evaluation (Signed)
Occupational Therapy Evaluation Patient Details Name: Terry Taylor MRN: [id number] DOB: [birth year] Today's Date: 03/09/2013 Time: 4098-1191 OT Time Calculation (min): 20 min  OT Assessment / Plan / Recommendation Clinical Impression  This 79 y.o. male admitted with fever and generalized weakness.  Pt very active PTA.  Pt. presents to OT with mildly decreased balance, and decreased activity tolerance, that should improve as he returns to his daily tasks.  Spoke at length with pt and his son re: need for supervision initially at home as the risk for fall is higher at this time, due to decreased activity tolerance.  They both verbalized understanding.  Pt is hopeful to return home today.  No further OT needs identified    OT Assessment  Patient does not need any further OT services    Follow Up Recommendations  No OT follow up;Supervision/Assistance - 24 hour    Barriers to Discharge      Equipment Recommendations  None recommended by OT    Recommendations for Other Services    Frequency       Precautions / Restrictions Precautions Precautions: Fall Restrictions Weight Bearing Restrictions: No       ADL  Eating/Feeding: Independent Where Assessed - Eating/Feeding: Chair Grooming: Wash/dry hands;Wash/dry face;Teeth care;Supervision/safety Where Assessed - Grooming: Supported standing Upper Body Bathing: Supervision/safety Where Assessed - Upper Body Bathing: Supported standing Lower Body Bathing: Min guard Where Assessed - Lower Body Bathing: Supported standing Upper Body Dressing: Set up Where Assessed - Upper Body Dressing: Unsupported sitting Lower Body Dressing: Min guard Where Assessed - Lower Body Dressing: Supported sit to Pharmacist, hospital: Hydrographic surveyor Method: Sit to Barista: Comfort height toilet Toileting - Architect and Hygiene: Min guard Where Assessed - Engineer, mining and Hygiene:  Standing Tub/Shower Transfer: Insurance risk surveyor Method: Science writer: Walk in shower Transfers/Ambulation Related to ADLs: min guard assist ADL Comments: Pt. able to don/doff socks with supervision.  Pt. able to simulate showering in standing with min guard assist to supervision with UE support.  Discussed effect of bed rest on activity tolerance and balance with pt and son, and reinforced need for supervision at home, and to increase activity gradually to prevent falls.  They both verbalize understanding    OT Diagnosis:    OT Problem List:   OT Treatment Interventions:     OT Goals    Visit Information  Last OT Received On: 03/09/13 Assistance Needed: +1    Subjective Data  Subjective: "I'm hoping to go home today" Patient Stated Goal: To go home   Prior Functioning     Home Living Lives With: Spouse Available Help at Discharge: Family;Available 24 hours/day Type of Home: House Home Access: Level entry Home Layout: Two level Alternate Level Stairs-Number of Steps: 12 Alternate Level Stairs-Rails: Left Bathroom Shower/Tub: Health visitor: Standard Home Adaptive Equipment: None Additional Comments[place] and still helps out Prior Function Level of Independence: Independent Able to Take Stairs?: Yes Driving: Yes 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  elbow distally WFL - arthritis deformity noted bil.  hands RUE Coordination: WFL - fine motor Left Upper Extremity Assessment LUE ROM/Strength/Tone: Deficits LUE ROM/Strength/Tone Deficits: Pt with very limited shoulder ROM due to arthritis.  elbow distally WFL - arthritis deformity noted bil. hands LUE Coordination: WFL - fine motor Right Lower Extremity Assessment RLE ROM/Strength/Tone: Within functional levels Left Lower Extremity Assessment LLE ROM/Strength/Tone: Within functional levels Trunk Assessment Trunk Assessment: Normal     Mobility Bed Mobility Bed Mobility: Supine to Sit Supine to Sit: 6: Modified independent (Device/Increase time);HOB flat Details for Bed Mobility Assistance: increased time Transfers Sit to Stand: 5: Supervision Stand to Sit: 6: Modified independent (Device/Increase time) Details for Transfer Assistance: pt with safe technique     Exercise     Balance     End of Session OT - End of Session Activity Tolerance: Patient tolerated treatment well Patient left: in chair;with call bell/phone within reach;with family/visitor present  GO     Conarpe, Ursula Alert M 03/09/2013, 10:53 AM

## 2013-03-09 NOTE — Progress Notes (Signed)
EEG completed.

## 2013-03-10 LAB — CSF CULTURE W GRAM STAIN
Culture: NO GROWTH
Special Requests: NORMAL

## 2013-03-10 MED ORDER — AMLODIPINE BESYLATE 10 MG PO TABS: 10.0000 mg | ORAL_TABLET | Freq: Every day | ORAL | Status: DC

## 2013-03-10 MED ORDER — THIAMINE HCL 100 MG PO TABS: 100.0000 mg | ORAL_TABLET | Freq: Every day | ORAL | Status: DC

## 2013-03-10 MED ORDER — AMLODIPINE BESYLATE 5 MG PO TABS
5.0000 mg | ORAL_TABLET | Freq: Once | ORAL | Status: AC
  Administered 2013-03-10: 5 mg via ORAL
  Filled 2013-03-10: qty 1

## 2013-03-10 MED ORDER — LOSARTAN POTASSIUM 50 MG PO TABS: 50.0000 mg | ORAL_TABLET | Freq: Every day | ORAL | Status: DC

## 2013-03-10 MED ORDER — FOLIC ACID 1 MG PO TABS: 1.0000 mg | ORAL_TABLET | Freq: Every day | ORAL | Status: DC

## 2013-03-10 NOTE — Procedures (Signed)
EEG NUMBER:  [id number]  REFERRING PHYSICIAN:  Srikar A Reddy  INDICATION FOR STUDY:  An 79 year old man with confusion and memory difficulty, as well as weakness and difficulty walking.  Study is being performed to rule out acute encephalopathy.  DESCRIPTION:  This is a routine EEG recording performed during wakefulness.  Predominant background activity consisted of 8 Hz symmetrical alpha rhythm recorded from the posterior head regions. Photic stimulation produced a symmetrical occipital driving response. Hyperventilation produced a normal, mild, generalized transient slowing response.  No epileptiform discharges were recorded.  There were no areas of abnormal slowing.  INTERPRETATION:  This is a normal EEG recording during wakefulness.     [name], MD    XB:MWUX D:  03/09/2013 18:01:21  T:  03/10/2013 05:00:13  Job #:  [id number]

## 2013-03-10 NOTE — Progress Notes (Signed)
dulera given 

## 2013-03-10 NOTE — Progress Notes (Signed)
[facility] PROGRESS NOTE  MONTEZ CUDA WJX:[id number] DOB: 03-26-1928 DOA: 03/06/2013 PCP: Evelene Croon, MD  Assessment/Plan: Fever Afebrile since admission.  Blood cultures not sent. Urine culture showed multiple bacterial morphotypes.  CSF culture negative.  Discontinue empiric broad-spectrum antibiotics with vancomycin and Zosyn, transitioned to levofloxacin. Chest x-ray not suggestive of pneumonia, and patient is not endorsing any respiratory symptoms.  Influenza panel negative, discontinued Tamiflu.    Acute encephalopathy/altered mental status/acute delirium Etiology unclear, suspect patient's confusion was due to patient accidentally taking his Friday's and Saturday's dose of his medications on 03/06/2013.  Head CT negative for any acute intracranial abnormality.  MRI of the brain negative.  Lumbar puncture on 03/06/2013, tube 4, 0 WBCs, CSF glucose 51.  CSF HSV PCR pending.  EEG of the brain negative.  There was question whether patient was consuming significant amount of alcohol at home.  He does admit to drinking one shot glass of vodka daily, which may not be enough to cause withdrawal, on CIWA protocol without any signs of withdrawl.  Continue thiamine and folic acid.  Generalized weakness May be related to fever and altered mental status.  Ambulated with PT, no PT needs.  HTN Restart home medications. Continue increased dose of losartan. Continue Amlodipine.  Hold valsartan/hydrochlorothiazide. Further titration as outpatient.  Hyperlipidemia Continue Lovaza.   CAD Stable continue aspirin.  COPD Stable.  Currently not in exacerbation.  Continue Advair, Spiriva, and treatments.  Thrombocytopenia Stable.  Continue to monitor.  Prophylaxis Lovenox.  Code Status: Full code. Family Communication: Family by bedside. Disposition Plan: DC home today.  Consultants:  None.  Procedures:  LP on 03/06/2013  Antibiotics:  Vancomycin 03/07/2013 >>  03/08/2013  Zosyn 03/07/2013 >> 03/08/2013  Oseltamivir 03/07/2013 >> 03/08/2013  Levofloxacin 03/08/2013 >> 03/10/2013  HPI/Subjective: No complaints. Eager to go home.  Objective: Filed Vitals:   03/10/13 0145 03/10/13 0542 03/10/13 0922 03/10/13 0930  BP: 147/67 158/88    Pulse: 59 66    Temp:  97.7 F (36.5 C)  98.2 F (36.8 C)  TempSrc:  Oral  Oral  Resp:  18    Height:      Weight:      SpO2:  97% 97%     Intake/Output Summary (Last 24 hours) at 03/10/13 1131 Last data filed at 03/10/13 0900  Gross per 24 hour  Intake   1820 ml  Output    950 ml  Net    870 ml   Filed Weights   03/06/13 1631  Weight: 90.719 kg (200 lb)    Exam: Physical Exam: General: Awake, Oriented to self, location, month, and year, No acute distress. HEENT: EOMI. Neck: Supple CV: S1 and S2 Lungs: Clear to ascultation bilaterally Abdomen: Soft, Nontender, Nondistended, +bowel sounds. Ext: Good pulses. Trace edema.  Data Reviewed: Basic Metabolic Panel:  Recent Labs Lab 03/06/13 1651 03/07/13 0600 03/08/13 0550 03/09/13 0657  NA 134* 134* 137 139  K 4.7 4.0 3.9 3.8  CL 96 99 100 104  CO2 27 24 24 25   GLUCOSE 92 83 95 99  BUN 16 18 18 14   CREATININE 1.38* 1.40* 1.27 1.19  CALCIUM 9.3 8.5 8.8 9.0   Liver Function Tests:  Recent Labs Lab 03/06/13 1651  AST 14  ALT 12  ALKPHOS 51  BILITOT 1.1  PROT 6.6  ALBUMIN 3.8   No results found for this basename: LIPASE, AMYLASE,  in the last 168 hours  Recent Labs Lab 03/09/13 0654  AMMONIA 31  CBC:  Recent Labs Lab 03/06/13 1651 03/07/13 0600 03/08/13 0550 03/09/13 0657  WBC 8.9 4.7 5.2 5.5  NEUTROABS 7.1  --   --   --   HGB 15.3 13.8 14.6 14.4  HCT 43.1 39.3 41.3 40.6  MCV 82.6 82.4 82.1 81.7  PLT 136* 126* 132* 140*   Cardiac Enzymes: No results found for this basename: CKTOTAL, CKMB, CKMBINDEX, TROPONINI,  in the last 168 hours BNP (last 3 results) No results found for this basename: PROBNP,  in the last  8760 hours CBG: No results found for this basename: GLUCAP,  in the last 168 hours  Recent Results (from the past 240 hour(s))  URINE CULTURE     Status: None   Collection Time    03/06/13  7:24 PM      Result Value Range Status   Specimen Description URINE, CATHETERIZED   Final   Special Requests NONE   Final   Culture  Setup Time 03/06/2013 20:27   Final   Colony Count 10,000 COLONIES/ML   Final   Culture     Final   Value: Multiple bacterial morphotypes present, none predominant. Suggest appropriate recollection if clinically indicated.   Report Status 03/07/2013 FINAL   Final  CSF CULTURE     Status: None   Collection Time    03/06/13 10:40 PM      Result Value Range Status   Specimen Description CSF   Final   Special Requests Normal   Final   Gram Stain     Final   Value: CYTOSPIN SLIDE WBC PRESENT,BOTH PMN AND MONONUCLEAR     NO ORGANISMS SEEN     Performed at [facility]   Culture NO GROWTH 2 DAYS   Final   Report Status PENDING   Incomplete  GRAM STAIN     Status: None   Collection Time    03/06/13 10:40 PM      Result Value Range Status   Specimen Description CSF   Final   Special Requests Normal   Final   Gram Stain     Final   Value: CYTOSPIN SLIDE     WBC PRESENT,BOTH PMN AND MONONUCLEAR     NO ORGANISMS SEEN   Report Status 03/06/2013 FINAL   Final     Studies: No results found.  Scheduled Meds: . [START ON 03/11/2013] amLODipine  10 mg Oral Daily  . amLODipine  5 mg Oral Once  . aspirin EC  325 mg Oral Daily  . enoxaparin (LOVENOX) injection  40 mg Subcutaneous Daily  . folic acid  1 mg Oral Daily  . losartan  50 mg Oral Daily  . mometasone-formoterol  2 puff Inhalation BID  . multivitamin with minerals  1 tablet Oral Daily  . omega-3 acid ethyl esters  2 g Oral BID  . tamsulosin  0.4 mg Oral Daily  . thiamine  100 mg Oral Daily   Or  . thiamine  100 mg Intravenous Daily  . tiotropium  18 mcg Inhalation Daily   Continuous Infusions: .  sodium chloride 75 mL/hr (03/09/13 2142)    Principal Problem:   Fever Active Problems:   HYPERCHOLESTEROLEMIA  IIA   HYPERTENSION, UNSPECIFIED   CAD, UNSPECIFIED SITE   COPD   Acute encephalopathy   Weakness generalized    Havana Baldwin A, MD  [facility] Pager [phone]. If 7PM-7AM, please contact night-coverage at [email or web address], password [email or web address] [facility] 03/10/2013, 11:31 AM  LOS: 4 days

## 2013-03-10 NOTE — Discharge Summary (Signed)
Physician Discharge Summary  Terry Taylor:[id number] DOB: 1928/03/17 DOA: 03/06/2013  PCP: Evelene Croon, MD  Admit date: 03/06/2013 Discharge date: 03/10/2013  Time spent: 35 minutes  Recommendations for Outpatient Follow-up:  Please followup with primary care physician in 1-2 weeks. Please have your blood pressure checked to see if you blood pressure medications need to be adjusted.  Please discontinue valium.  Please do not consume alcohol daily, may drink alcohol socially in small amounts once or twice a month.  Discharge Diagnoses:  Principal Problem:   Fever Active Problems:   HYPERCHOLESTEROLEMIA  IIA   HYPERTENSION, UNSPECIFIED   CAD, UNSPECIFIED SITE   COPD   Acute encephalopathy   Weakness generalized   Discharge Condition: Stable  Diet recommendation: Heart healthy diet  Filed Weights   03/06/13 1631  Weight: 90.719 kg (200 lb)    History of present illness:  On admission: "Terry Taylor is a 79 y.o. male who presents to the ED with worsening confusion and weakness on 03/07/2013."  Hospital Course:  Fever  Afebrile since admission. Blood cultures not sent. Urine culture showed multiple bacterial morphotypes. CSF culture negative. Completed 5 day empiric course of antibiotics. Chest x-ray not suggestive of pneumonia, and patient is not endorsing any respiratory symptoms. Influenza panel negative.   Acute encephalopathy/altered mental status/acute delirium  Etiology unclear, suspect patient's confusion was due to patient accidentally taking his Friday's and Saturday's dose of his medications on 03/06/2013. Head CT negative for any acute intracranial abnormality. MRI of the brain negative. Lumbar puncture on 03/06/2013, tube 4, 0 WBCs, CSF glucose 51. CSF HSV PCR pending. EEG of the brain negative. There was question whether patient was consuming significant amount of alcohol at home. He does admit to drinking one shot glass of vodka daily, which may  not be enough to cause withdrawal, on CIWA protocol without any signs of withdrawal, encouraged the patient to drink minimally and socially at discharge. Continue thiamine and folic acid.   Generalized weakness  May be related to fever and altered mental status. Ambulated with PT, no PT needs.   HTN  Restart home medications. Continue increased dose of losartan. Continue Amlodipine. Hold valsartan/hydrochlorothiazide. Further titration as outpatient.   Hyperlipidemia  Continue Lovaza.   CAD  Stable continue aspirin.   COPD  Stable. Currently not in exacerbation. Continue Advair, Spiriva, and treatments.   Thrombocytopenia  Stable. Continue to monitor. May be related to alcohol use.  Consultants:  None.  Procedures:  LP on 03/06/2013  Antibiotics:  Vancomycin 03/07/2013 >> 03/08/2013  Zosyn 03/07/2013 >> 03/08/2013  Oseltamivir 03/07/2013 >> 03/08/2013  Levofloxacin 03/08/2013 >> 03/10/2013  Discharge Exam: Filed Vitals:   03/10/13 0145 03/10/13 0542 03/10/13 0922 03/10/13 0930  BP: 147/67 158/88    Pulse: 59 66    Temp:  97.7 F (36.5 C)  98.2 F (36.8 C)  TempSrc:  Oral  Oral  Resp:  18    Height:      Weight:      SpO2:  97% 97%    Discharge Instructions  Discharge Orders   Future Orders Complete By Expires     Diet - low sodium heart healthy  As directed     Discharge instructions  As directed     Comments:      Please followup with primary care physician in 1-2 weeks. Please have your blood pressure checked to see if you blood pressure medications need to be adjusted.  Please discontinue valium.  Please do not  consume alcohol daily, may drink alcohol socially in small amounts once or twice a month.    Increase activity slowly  As directed         Medication List    STOP taking these medications       amLODipine-valsartan 10-320 MG per tablet  Commonly known as:  EXFORGE  Replaced by:  amLODipine 10 MG tablet     diazepam 5 MG tablet  Commonly known  as:  VALIUM     valsartan-hydrochlorothiazide 320-12.5 MG per tablet  Commonly known as:  DIOVAN-HCT      TAKE these medications       amLODipine 10 MG tablet  Commonly known as:  NORVASC  Take 1 tablet (10 mg total) by mouth daily.  Start taking on:  03/11/2013     aspirin 325 MG buffered tablet  Take 325 mg by mouth daily.     Fluticasone-Salmeterol 250-50 MCG/DOSE Aepb  Commonly known as:  ADVAIR  Inhale 1 puff into the lungs daily as needed.     folic acid 1 MG tablet  Commonly known as:  FOLVITE  Take 1 tablet (1 mg total) by mouth daily.     LINZESS 145 MCG Caps  Generic drug:  Linaclotide  Take 145 mcg by mouth daily.     losartan 50 MG tablet  Commonly known as:  COZAAR  Take 1 tablet (50 mg total) by mouth daily.     omega-3 acid ethyl esters 1 G capsule  Commonly known as:  LOVAZA  Take 2 g by mouth 2 (two) times daily.     SPIRIVA HANDIHALER 18 MCG inhalation capsule  Generic drug:  tiotropium  Place 18 mcg into inhaler and inhale daily.     tamsulosin 0.4 MG Caps  Commonly known as:  FLOMAX  Take 0.4 mg by mouth daily.     thiamine 100 MG tablet  Take 1 tablet (100 mg total) by mouth daily.     traMADol 50 MG tablet  Commonly known as:  ULTRAM  Take 50 mg by mouth every 6 (six) hours as needed for pain. For pain           Follow-up Information   Follow up with PCP. Schedule an appointment as soon as possible for a visit in 1 week.       The results of significant diagnostics from this hospitalization (including imaging, microbiology, ancillary and laboratory) are listed below for reference.    Significant Diagnostic Studies: Dg Chest 2 View  03/06/2013  *RADIOLOGY REPORT*  Clinical Data: Altered mental status.  Ex-smoker.  CHEST - 2 VIEW  Comparison: None.  Findings: Severe bilateral glenohumeral joint osteoarthritis. Patient rotated right.  Mild cardiomegaly with a tortuous thoracic aorta. No pleural effusion or pneumothorax.  No congestive  failure.  Low lung volumes with resultant pulmonary interstitial prominence.  IMPRESSION: Cardiomegaly and low lung volumes, without acute disease.   Original Report Authenticated By: Jeronimo Greaves, M.D.    Ct Head Wo Contrast  03/06/2013  *RADIOLOGY REPORT*  Clinical Data: Altered mental status.  CT HEAD WITHOUT CONTRAST  Technique:  Contiguous axial images were obtained from the base of the skull through the vertex without contrast.  Comparison: None.  Findings: Bone windows demonstrate clear paranasal sinuses and mastoid air cells.  Soft tissue windows demonstrate mildly advanced cerebral atrophy. Mild ventriculomegaly, felt to be secondary.  Cerebellar atrophy as well.  No  mass lesion, hemorrhage, hydrocephalus, acute infarct, intra- axial, or extra-axial fluid collection.  IMPRESSION:  1. No acute intracranial abnormality. 2.  Advanced cerebral and cerebellar atrophy.  Mild ventriculomegaly is felt to be secondary.   Original Report Authenticated By: Jeronimo Greaves, M.D.    Mr Brain Wo Contrast  03/07/2013  *RADIOLOGY REPORT*  Clinical Data: Confusion  MRI HEAD WITHOUT CONTRAST  Technique:  Multiplanar, multiecho pulse sequences of the brain and surrounding structures were obtained according to standard protocol without intravenous contrast.  Comparison: CT 03/06/2013  Findings: Generalized atrophy with prominent ventricles, unchanged. Negative for acute infarct.  No significant chronic ischemia.  Negative for intracranial hemorrhage.  Negative for mass or edema. No midline shift.  IMPRESSION: Generalized atrophy.  No acute abnormality.   Original Report Authenticated By: Janeece Riggers, M.D.    Dg Lumbar Puncture Fluoro Guide  03/06/2013  *RADIOLOGY REPORT*  Clinical Data:  Altered mental status, evaluate for meningitis. Unsuccessful attempted bedside lumbar puncture in the emergency department.  DIAGNOSTIC LUMBAR PUNCTURE UNDER FLUOROSCOPIC GUIDANCE  Fluoroscopy time:  0.5 minutes.  Technique:  Informed  consent was obtained from the patient prior to the procedure, including potential complications of headache, allergy, and pain.   With the patient prone, the lower back was prepped with Betadine.  1% Lidocaine was used for local anesthesia. Lumbar puncture was performed at the L2-L3 level using a 20 gauge needle with return of clear CSF with an opening pressure of 18.5 cm water.   11.5 ml of CSF were obtained for laboratory studies.  The patient tolerated the procedure well and there were no apparent complications.  IMPRESSION:  Technically successful lumbar puncture under fluoroscopic guidance.   Original Report Authenticated By: Malachy Moan, M.D.     Microbiology: Recent Results (from the past 240 hour(s))  URINE CULTURE     Status: None   Collection Time    03/06/13  7:24 PM      Result Value Range Status   Specimen Description URINE, CATHETERIZED   Final   Special Requests NONE   Final   Culture  Setup Time 03/06/2013 20:27   Final   Colony Count 10,000 COLONIES/ML   Final   Culture     Final   Value: Multiple bacterial morphotypes present, none predominant. Suggest appropriate recollection if clinically indicated.   Report Status 03/07/2013 FINAL   Final  CSF CULTURE     Status: None   Collection Time    03/06/13 10:40 PM      Result Value Range Status   Specimen Description CSF   Final   Special Requests Normal   Final   Gram Stain     Final   Value: CYTOSPIN SLIDE WBC PRESENT,BOTH PMN AND MONONUCLEAR     NO ORGANISMS SEEN     Performed at [facility]   Culture NO GROWTH 2 DAYS   Final   Report Status PENDING   Incomplete  GRAM STAIN     Status: None   Collection Time    03/06/13 10:40 PM      Result Value Range Status   Specimen Description CSF   Final   Special Requests Normal   Final   Gram Stain     Final   Value: CYTOSPIN SLIDE     WBC PRESENT,BOTH PMN AND MONONUCLEAR     NO ORGANISMS SEEN   Report Status 03/06/2013 FINAL   Final     Labs: Basic  Metabolic Panel:  Recent Labs Lab 03/06/13 1651 03/07/13 0600 03/08/13 0550 03/09/13 0657  NA 134* 134* 137  139  K 4.7 4.0 3.9 3.8  CL 96 99 100 104  CO2 27 24 24 25   GLUCOSE 92 83 95 99  BUN 16 18 18 14   CREATININE 1.38* 1.40* 1.27 1.19  CALCIUM 9.3 8.5 8.8 9.0   Liver Function Tests:  Recent Labs Lab 03/06/13 1651  AST 14  ALT 12  ALKPHOS 51  BILITOT 1.1  PROT 6.6  ALBUMIN 3.8   No results found for this basename: LIPASE, AMYLASE,  in the last 168 hours  Recent Labs Lab 03/09/13 0654  AMMONIA 31   CBC:  Recent Labs Lab 03/06/13 1651 03/07/13 0600 03/08/13 0550 03/09/13 0657  WBC 8.9 4.7 5.2 5.5  NEUTROABS 7.1  --   --   --   HGB 15.3 13.8 14.6 14.4  HCT 43.1 39.3 41.3 40.6  MCV 82.6 82.4 82.1 81.7  PLT 136* 126* 132* 140*   Cardiac Enzymes: No results found for this basename: CKTOTAL, CKMB, CKMBINDEX, TROPONINI,  in the last 168 hours BNP: BNP (last 3 results) No results found for this basename: PROBNP,  in the last 8760 hours CBG: No results found for this basename: GLUCAP,  in the last 168 hours     Signed:  Xuan Mateus A  [facility] 03/10/2013, 11:39 AM

## 2014-10-06 LAB — BASIC METABOLIC PANEL
ANION GAP: 9 (ref 7–16)
BUN: 25 mg/dL — AB (ref 7–18)
CALCIUM: 8.2 mg/dL — AB (ref 8.5–10.1)
CO2: 22 mmol/L (ref 21–32)
Chloride: 102 mmol/L (ref 98–107)
Creatinine: 1.94 mg/dL — ABNORMAL HIGH (ref 0.60–1.30)
EGFR (Non-African Amer.): 35 — ABNORMAL LOW
GFR CALC AF AMER: 43 — AB
Glucose: 132 mg/dL — ABNORMAL HIGH (ref 65–99)
OSMOLALITY: 273 (ref 275–301)
POTASSIUM: 4.6 mmol/L (ref 3.5–5.1)
Sodium: 133 mmol/L — ABNORMAL LOW (ref 136–145)

## 2014-10-06 LAB — CBC
HCT: 43.1 % (ref 40.0–52.0)
HGB: 14.1 g/dL (ref 13.0–18.0)
MCH: 29 pg (ref 26.0–34.0)
MCHC: 32.8 g/dL (ref 32.0–36.0)
MCV: 89 fL (ref 80–100)
Platelet: 179 10*3/uL (ref 150–440)
RBC: 4.87 10*6/uL (ref 4.40–5.90)
RDW: 14.3 % (ref 11.5–14.5)
WBC: 11.1 10*3/uL — ABNORMAL HIGH (ref 3.8–10.6)

## 2014-10-06 LAB — TROPONIN I

## 2014-10-07 ENCOUNTER — Observation Stay: Payer: Self-pay | Admitting: Internal Medicine

## 2014-10-07 LAB — TROPONIN I
Troponin-I: <0.02 ng/mL
Troponin-I: <0.02 ng/mL
Troponin-I: <0.02 ng/mL

## 2014-10-08 LAB — URINALYSIS, COMPLETE
Bacteria: NONE SEEN
Bilirubin,UR: NEGATIVE
GLUCOSE, UR: NEGATIVE mg/dL (ref 0–75)
KETONE: NEGATIVE
Leukocyte Esterase: NEGATIVE
Nitrite: NEGATIVE
PH: 5 (ref 4.5–8.0)
PROTEIN: NEGATIVE
RBC,UR: 2 /HPF (ref 0–5)
SQUAMOUS EPITHELIAL: NONE SEEN
Specific Gravity: 1.009 (ref 1.003–1.030)

## 2014-10-08 LAB — BASIC METABOLIC PANEL
ANION GAP: 8 (ref 7–16)
BUN: 20 mg/dL — AB (ref 7–18)
CALCIUM: 7.9 mg/dL — AB (ref 8.5–10.1)
CHLORIDE: 106 mmol/L (ref 98–107)
CO2: 24 mmol/L (ref 21–32)
Creatinine: 1.21 mg/dL (ref 0.60–1.30)
EGFR (Non-African Amer.): >60
GLUCOSE: 77 mg/dL (ref 65–99)
OSMOLALITY: 277 (ref 275–301)
POTASSIUM: 3.8 mmol/L (ref 3.5–5.1)
SODIUM: 138 mmol/L (ref 136–145)

## 2014-10-09 LAB — URINE CULTURE

## 2015-04-19 DIAGNOSIS — I10 Essential (primary) hypertension: Secondary | ICD-10-CM | POA: Diagnosis not present

## 2015-04-19 DIAGNOSIS — E780 Pure hypercholesterolemia: Secondary | ICD-10-CM | POA: Diagnosis not present

## 2015-04-19 DIAGNOSIS — E559 Vitamin D deficiency, unspecified: Secondary | ICD-10-CM | POA: Diagnosis not present

## 2015-04-19 DIAGNOSIS — N320 Bladder-neck obstruction: Secondary | ICD-10-CM | POA: Diagnosis not present

## 2015-04-19 DIAGNOSIS — H109 Unspecified conjunctivitis: Secondary | ICD-10-CM | POA: Diagnosis not present

## 2015-04-19 DIAGNOSIS — E784 Other hyperlipidemia: Secondary | ICD-10-CM | POA: Diagnosis not present

## 2015-04-19 DIAGNOSIS — E119 Type 2 diabetes mellitus without complications: Secondary | ICD-10-CM | POA: Diagnosis not present

## 2015-04-19 DIAGNOSIS — F419 Anxiety disorder, unspecified: Secondary | ICD-10-CM | POA: Diagnosis not present

## 2015-04-19 DIAGNOSIS — R5383 Other fatigue: Secondary | ICD-10-CM | POA: Diagnosis not present

## 2015-04-23 NOTE — Discharge Summary (Signed)
PATIENT NAME:  Terry Taylor, Terry Taylor MR#:  [id number] DATE OF BIRTH:  May 12, 1928  DATE OF ADMISSION:  10/07/2014 DATE OF DISCHARGE:  10/08/2014  PRIMARY CARE PHYSICIAN: Meindert A. Brunetta Genera, MD  FINAL DIAGNOSES: 1.  Acute encephalopathy.  2.  Weakness.  3.  Acute renal failure, which improved.  4.  Hypertension.  5.  BPH.  6.  Hyperlipidemia.   MEDICATIONS ON DISCHARGE: Include clonazepam 0.5 mg twice a day, Flomax 0.4 mg daily, amlodipine 5 mg daily, aspirin 81 mg daily. Stop pravastatin 40 mg daily, stop losartan 50 mg daily.   FOLLOWUP: In 1 to 2 weeks with Dr. Brunetta Genera. Set up home health physical therapy, nurse, nurse aide. Help with medications and strength.   DIET: Low-sodium diet, regular consistency.   ACTIVITY: As tolerated.   The patient was admitted 10/07/2014 and discharged 10/08/2014. Came in with difficulty walking for a few days, weakness, and dehydration, found to be in acute renal failure. The patient was given IV fluid hydration.   LABORATORY AND RADIOLOGICAL DATA DURING THE HOSPITAL COURSE: Included an EKG that showed normal sinus rhythm. No acute ST-T wave changes. Glucose 132, BUN 25, creatinine 1.94, sodium 133, potassium 4.6, chloride 102, CO2 of 22, calcium 8.2. White blood cell count 11.1, hemoglobin and hematocrit 14.1 and 43.1, platelet count 179,000. Troponin negative. Chest x-ray: Minimal left base atelectasis. No edema or consolidation. Extensive atrophy in each shoulder. Three troponins were negative. Creatinine upon discharge 1.21, sodium 138, potassium 3.8, chloride 106. Urinalysis 1+ blood, otherwise negative. MRI of the brain: Age related atrophy. No acute findings. No specific cause of the presenting symptoms.   HOSPITAL COURSE PER PROBLEM LIST:  1.  For the patient's acute encephalopathy, the patient does have underlying dementia. I think this was worsened with the acute renal failure.  2.  Weakness, generalized. The patient was stating that he has  weakness, more in the right lower extremity. MRI of the brain was negative. The patient was able to straight leg raise for me upon presentation. We are still waiting for the official physical therapy note, but physical therapy recommended rehabilitation, which was refused by the patient. He wants to go home. We did set up home health for the patient and a rolling walker.  3.  Acute renal failure. This had improved with IV fluid hydration. I stopped the losartan.  4.  Hypertension. Blood pressure stable on amlodipine.  5.  BPH, on Flomax.  6.  Hyperlipidemia. I stopped the patient's pravastatin since the patient is having weakness. Would recommend a trial off the pravastatin.   TIME SPENT ON DISCHARGE: 35 minutes.   Of note, of course when the patient does refuse rehabilitation when physical therapy recommends rehabilitation, safety at home is always an issue. Home health will be set up as the patient is a big man and his wife may not be able to help out very much. Cannot send the patient to rehabilitation when the patient does not want to go.    ____________________________ Tana Conch. Leslye Peer, MD rjw:at D: 10/08/2014 15:15:26 ET T: 10/13/2014 12:10:36 ET JOB#: [id number]  cc: Tana Conch. Leslye Peer, MD, <Dictator> Meindert A. Brunetta Genera, MD Marisue Brooklyn MD ELECTRONICALLY SIGNED 10/17/2014 12:50

## 2015-04-23 NOTE — H&P (Signed)
PATIENT NAME:  Terry Taylor, COAKLEY MR#:  [id number] DATE OF BIRTH:  03/12/1928  DATE OF ADMISSION:  10/07/2014  REFERRING PHYSICIAN: Dr. Archie Balboa   FAMILY PHYSICIAN: Meindert A. Brunetta Genera, MD  ADMITTING DIAGNOSES: Weakness and dehydration.   HISTORY OF PRESENT ILLNESS: This is an 81 year old Caucasian gentleman who presents to the Emergency Department complaining of pains all over including chest pain which radiated from his right shoulder. The patient also complains of feeling chills and weakness. His wife states that he could not get up from a seated position earlier today and that his appetite is also significantly decreased. Usually he eats very well. The patient agrees that he was in pain and feeling weak and was finally convinced to come to the Emergency Department this afternoon. At that time he complained of some shortness of breath that he admitted may be related to anxiety. He currently feels a lot better but laboratory results showed some acute kidney injury, which prompted the Emergency Department to call for admission.   REVIEW OF SYSTEMS:   CONSTITUTIONAL: The patient denies fever but admits to chills and generalized weakness.  EARS, NOSE AND THROAT: The patient denies tinnitus or sore throat.  EYES: The patient denies diplopia or inflammation.  CARDIOVASCULAR: The patient denies palpitations but admits to some chest pain earlier today.  RESPIRATORY: The patient admits to some shortness of breath, which is currently resolved, and admits to some cough which is also improved.  GASTROINTESTINAL: The patient denies vomiting or diarrhea but admits to some nausea.  GENITOURINARY: The patient denies dysuria, increased frequency or hesitancy.  ENDOCRINE: The patient denies polyuria or nocturia.  HEMATOLOGIC AND LYMPHATIC: The patient denies bruising or easy bleeding.  MUSCULOSKELETAL: The patient admits to severe joint pain in his right shoulder as well as his knees but denies any actual  muscle weakness.  SKIN: The patient denies rashes or lesions.  NEUROLOGIC: The patient denies dysarthria or numbness in his extremities.  PSYCHIATRIC: The patient denies depression or suicidal ideation.   PAST MEDICAL HISTORY: COPD and arthritis.   SURGICAL HISTORY: Diskectomy, internal fixation of the right lower extremity and a rib fusion or sternal repair following traumatic injury to his ribs.   FAMILY HISTORY: Significant for diabetes in his mother and brother.   SOCIAL HISTORY: The patient is married and lives with his wife. He is usually ambulatory but is limited by pain. He quit smoking 40 years ago and he rarely drinks alcohol and takes no illegal drugs.   PERTINENT LABORATORY RESULTS AND RADIOGRAPHIC FINDINGS: Serum glucose is 132, BUN 25, creatinine is 1.94, sodium 133, potassium 4.6, chloride and bicarb are normal, calcium is 8.2. White blood cell count 11.1, hemoglobin 14.1, hematocrit 43.1, troponin is negative. Chest x-ray shows minimal left basal atelectasis with no edema or consolidation. There is extensive arthropathy in his shoulders bilaterally.   PHYSICAL EXAMINATION:  VITAL SIGNS: Temperature is 98.1, pulse 67, respirations 15, blood pressure 101/67, his pulse oximetry is 99% on room air.  GENERAL: The patient is alert and oriented x3 in no apparent distress.  HEENT: Normocephalic, atraumatic. Pupils equal, round and reactive to light and accommodation. Extraocular movements are intact. Mucous membranes are mildly dry.  NECK: Trachea is midline. No adenopathy.  CHEST: Symmetric, atraumatic.  CARDIOVASCULAR: Regular rate and rhythm. Normal S1 and S2. No rubs, clicks or murmurs appreciated.  LUNGS: Clear to auscultation bilaterally. Normal effort and excursion.  ABDOMEN: Positive bowel sounds. Soft, nontender, nondistended. No hepatosplenomegaly.  GENITOURINARY: Normal external male genitalia.  MUSCULOSKELETAL: The patient has limited range of motion at the shoulder and  his right upper extremity. He has an enlarged asymmetric arthritis of his PIP joints of the hand as well as his knees and shoulders.  SKIN: No rashes or lesions.  EXTREMITIES: No clubbing, cyanosis or edema.  NEUROLOGIC: Cranial nerves II through XII are grossly intact.  PSYCHIATRIC: Mood is normal. Affect is congruent.   ASSESSMENT AND PLAN: This is an 81 year old male admitted for weakness and acute kidney injury.  1. Acute kidney injury. Baseline is unknown. Currently the patient's estimated glomerular filtration rate is 35, which is low stage 3 chronic kidney disease. This is likely secondary to decreased oral intake today. His white count is mildly elevated, which, based on his other symptoms, could indicate that he may be fighting off a virus but certainly this is not much to be concerned about at this time. We will give the patient intravenous hydration and hold his losartan for the time being.  2. Weakness. Likely secondary to dehydration and acute kidney injury. The patient is also mildly hyponatremic, which could contribute to this as well. It should resolve with hydration with normal saline.  3. Deep vein thrombosis prophylaxis, heparin.  4. Gastrointestinal prophylaxis is not needed as the patient is not critically ill.  5. The patient desires to be a FULL CODE.   TIME SPENT ON ADMISSION ORDERS AND PATIENT CARE: Approximately 35 minutes.   ____________________________ Norva Riffle. Marcille Blanco, MD msd:AT D: 10/07/2014 01:27:00 ET T: 10/07/2014 02:58:18 ET JOB#: [id number]  cc: Norva Riffle. Marcille Blanco, MD, <Dictator> Norva Riffle Tage Feggins MD ELECTRONICALLY SIGNED 10/10/2014 0:01

## 2015-05-19 DIAGNOSIS — F05 Delirium due to known physiological condition: Secondary | ICD-10-CM | POA: Diagnosis not present

## 2015-05-19 DIAGNOSIS — I10 Essential (primary) hypertension: Secondary | ICD-10-CM | POA: Diagnosis not present

## 2015-05-19 DIAGNOSIS — N400 Enlarged prostate without lower urinary tract symptoms: Secondary | ICD-10-CM | POA: Diagnosis not present

## 2015-05-19 DIAGNOSIS — M129 Arthropathy, unspecified: Secondary | ICD-10-CM | POA: Diagnosis not present

## 2015-06-17 DIAGNOSIS — I10 Essential (primary) hypertension: Secondary | ICD-10-CM | POA: Diagnosis not present

## 2015-06-17 DIAGNOSIS — N400 Enlarged prostate without lower urinary tract symptoms: Secondary | ICD-10-CM | POA: Diagnosis not present

## 2015-06-17 DIAGNOSIS — M129 Arthropathy, unspecified: Secondary | ICD-10-CM | POA: Diagnosis not present

## 2015-06-17 DIAGNOSIS — F05 Delirium due to known physiological condition: Secondary | ICD-10-CM | POA: Diagnosis not present

## 2015-08-04 DIAGNOSIS — E559 Vitamin D deficiency, unspecified: Secondary | ICD-10-CM | POA: Diagnosis not present

## 2015-08-04 DIAGNOSIS — M129 Arthropathy, unspecified: Secondary | ICD-10-CM | POA: Diagnosis not present

## 2015-08-04 DIAGNOSIS — I4891 Unspecified atrial fibrillation: Secondary | ICD-10-CM | POA: Diagnosis not present

## 2015-08-04 DIAGNOSIS — F05 Delirium due to known physiological condition: Secondary | ICD-10-CM | POA: Diagnosis not present

## 2015-08-04 DIAGNOSIS — L989 Disorder of the skin and subcutaneous tissue, unspecified: Secondary | ICD-10-CM | POA: Diagnosis not present

## 2015-08-04 DIAGNOSIS — E119 Type 2 diabetes mellitus without complications: Secondary | ICD-10-CM | POA: Diagnosis not present

## 2015-08-04 DIAGNOSIS — Z79899 Other long term (current) drug therapy: Secondary | ICD-10-CM | POA: Diagnosis not present

## 2015-08-04 DIAGNOSIS — E780 Pure hypercholesterolemia: Secondary | ICD-10-CM | POA: Diagnosis not present

## 2015-08-04 DIAGNOSIS — N400 Enlarged prostate without lower urinary tract symptoms: Secondary | ICD-10-CM | POA: Diagnosis not present

## 2015-08-04 DIAGNOSIS — R739 Hyperglycemia, unspecified: Secondary | ICD-10-CM | POA: Diagnosis not present

## 2015-08-04 DIAGNOSIS — R5383 Other fatigue: Secondary | ICD-10-CM | POA: Diagnosis not present

## 2015-08-04 DIAGNOSIS — I10 Essential (primary) hypertension: Secondary | ICD-10-CM | POA: Diagnosis not present

## 2015-08-18 DIAGNOSIS — D0439 Carcinoma in situ of skin of other parts of face: Secondary | ICD-10-CM | POA: Diagnosis not present

## 2015-08-18 DIAGNOSIS — L578 Other skin changes due to chronic exposure to nonionizing radiation: Secondary | ICD-10-CM | POA: Diagnosis not present

## 2015-08-26 DIAGNOSIS — I10 Essential (primary) hypertension: Secondary | ICD-10-CM | POA: Diagnosis not present

## 2015-08-26 DIAGNOSIS — F05 Delirium due to known physiological condition: Secondary | ICD-10-CM | POA: Diagnosis not present

## 2015-08-26 DIAGNOSIS — M129 Arthropathy, unspecified: Secondary | ICD-10-CM | POA: Diagnosis not present

## 2015-08-26 DIAGNOSIS — Z09 Encounter for follow-up examination after completed treatment for conditions other than malignant neoplasm: Secondary | ICD-10-CM | POA: Diagnosis not present

## 2015-08-26 DIAGNOSIS — N400 Enlarged prostate without lower urinary tract symptoms: Secondary | ICD-10-CM | POA: Diagnosis not present

## 2015-09-27 DIAGNOSIS — M542 Cervicalgia: Secondary | ICD-10-CM | POA: Diagnosis not present

## 2015-09-27 DIAGNOSIS — N400 Enlarged prostate without lower urinary tract symptoms: Secondary | ICD-10-CM | POA: Diagnosis not present

## 2015-09-27 DIAGNOSIS — Z23 Encounter for immunization: Secondary | ICD-10-CM | POA: Diagnosis not present

## 2015-09-27 DIAGNOSIS — H6121 Impacted cerumen, right ear: Secondary | ICD-10-CM | POA: Diagnosis not present

## 2015-09-27 DIAGNOSIS — F05 Delirium due to known physiological condition: Secondary | ICD-10-CM | POA: Diagnosis not present

## 2015-09-27 DIAGNOSIS — M129 Arthropathy, unspecified: Secondary | ICD-10-CM | POA: Diagnosis not present

## 2015-12-01 DIAGNOSIS — L820 Inflamed seborrheic keratosis: Secondary | ICD-10-CM | POA: Diagnosis not present

## 2015-12-01 DIAGNOSIS — Z85828 Personal history of other malignant neoplasm of skin: Secondary | ICD-10-CM | POA: Diagnosis not present

## 2015-12-01 DIAGNOSIS — L578 Other skin changes due to chronic exposure to nonionizing radiation: Secondary | ICD-10-CM | POA: Diagnosis not present

## 2015-12-01 DIAGNOSIS — L821 Other seborrheic keratosis: Secondary | ICD-10-CM | POA: Diagnosis not present

## 2016-01-26 DIAGNOSIS — K219 Gastro-esophageal reflux disease without esophagitis: Secondary | ICD-10-CM | POA: Diagnosis not present

## 2016-01-26 DIAGNOSIS — I10 Essential (primary) hypertension: Secondary | ICD-10-CM | POA: Diagnosis not present

## 2016-01-26 DIAGNOSIS — T50901A Poisoning by unspecified drugs, medicaments and biological substances, accidental (unintentional), initial encounter: Secondary | ICD-10-CM | POA: Diagnosis not present

## 2016-01-26 DIAGNOSIS — Z8673 Personal history of transient ischemic attack (TIA), and cerebral infarction without residual deficits: Secondary | ICD-10-CM | POA: Diagnosis not present

## 2016-01-26 DIAGNOSIS — W19XXXA Unspecified fall, initial encounter: Secondary | ICD-10-CM | POA: Diagnosis not present

## 2016-01-26 DIAGNOSIS — E7800 Pure hypercholesterolemia, unspecified: Secondary | ICD-10-CM | POA: Diagnosis not present

## 2016-01-26 DIAGNOSIS — H02103 Unspecified ectropion of right eye, unspecified eyelid: Secondary | ICD-10-CM | POA: Diagnosis not present

## 2016-01-26 DIAGNOSIS — R5383 Other fatigue: Secondary | ICD-10-CM | POA: Diagnosis not present

## 2016-01-26 DIAGNOSIS — H02106 Unspecified ectropion of left eye, unspecified eyelid: Secondary | ICD-10-CM | POA: Diagnosis not present

## 2016-01-26 DIAGNOSIS — M79604 Pain in right leg: Secondary | ICD-10-CM | POA: Diagnosis not present

## 2016-01-26 DIAGNOSIS — N400 Enlarged prostate without lower urinary tract symptoms: Secondary | ICD-10-CM | POA: Diagnosis not present

## 2016-01-26 DIAGNOSIS — T7401XA Adult neglect or abandonment, confirmed, initial encounter: Secondary | ICD-10-CM | POA: Diagnosis not present

## 2016-01-26 DIAGNOSIS — I498 Other specified cardiac arrhythmias: Secondary | ICD-10-CM | POA: Diagnosis not present

## 2016-03-01 DIAGNOSIS — E119 Type 2 diabetes mellitus without complications: Secondary | ICD-10-CM | POA: Diagnosis not present

## 2016-03-01 DIAGNOSIS — M542 Cervicalgia: Secondary | ICD-10-CM | POA: Diagnosis not present

## 2016-03-01 DIAGNOSIS — I10 Essential (primary) hypertension: Secondary | ICD-10-CM | POA: Diagnosis not present

## 2016-03-01 DIAGNOSIS — R5383 Other fatigue: Secondary | ICD-10-CM | POA: Diagnosis not present

## 2016-03-01 DIAGNOSIS — E559 Vitamin D deficiency, unspecified: Secondary | ICD-10-CM | POA: Diagnosis not present

## 2016-03-01 DIAGNOSIS — F05 Delirium due to known physiological condition: Secondary | ICD-10-CM | POA: Diagnosis not present

## 2016-03-01 DIAGNOSIS — N400 Enlarged prostate without lower urinary tract symptoms: Secondary | ICD-10-CM | POA: Diagnosis not present

## 2016-03-01 DIAGNOSIS — R1013 Epigastric pain: Secondary | ICD-10-CM | POA: Diagnosis not present

## 2016-03-01 DIAGNOSIS — L03113 Cellulitis of right upper limb: Secondary | ICD-10-CM | POA: Diagnosis not present

## 2016-03-01 DIAGNOSIS — E7800 Pure hypercholesterolemia, unspecified: Secondary | ICD-10-CM | POA: Diagnosis not present

## 2016-04-25 DIAGNOSIS — H02109 Unspecified ectropion of unspecified eye, unspecified eyelid: Secondary | ICD-10-CM | POA: Diagnosis not present

## 2016-05-21 DIAGNOSIS — I10 Essential (primary) hypertension: Secondary | ICD-10-CM | POA: Diagnosis not present

## 2016-05-21 DIAGNOSIS — N400 Enlarged prostate without lower urinary tract symptoms: Secondary | ICD-10-CM | POA: Diagnosis not present

## 2016-05-21 DIAGNOSIS — E7800 Pure hypercholesterolemia, unspecified: Secondary | ICD-10-CM | POA: Diagnosis not present

## 2016-05-21 DIAGNOSIS — R5383 Other fatigue: Secondary | ICD-10-CM | POA: Diagnosis not present

## 2016-05-21 DIAGNOSIS — J449 Chronic obstructive pulmonary disease, unspecified: Secondary | ICD-10-CM | POA: Diagnosis not present

## 2016-05-21 DIAGNOSIS — E559 Vitamin D deficiency, unspecified: Secondary | ICD-10-CM | POA: Diagnosis not present

## 2016-05-21 DIAGNOSIS — E784 Other hyperlipidemia: Secondary | ICD-10-CM | POA: Diagnosis not present

## 2016-08-28 DIAGNOSIS — Z79899 Other long term (current) drug therapy: Secondary | ICD-10-CM | POA: Diagnosis not present

## 2016-08-28 DIAGNOSIS — E559 Vitamin D deficiency, unspecified: Secondary | ICD-10-CM | POA: Diagnosis not present

## 2016-08-28 DIAGNOSIS — R2681 Unsteadiness on feet: Secondary | ICD-10-CM | POA: Diagnosis not present

## 2016-08-28 DIAGNOSIS — L89302 Pressure ulcer of unspecified buttock, stage 2: Secondary | ICD-10-CM | POA: Diagnosis not present

## 2016-08-28 DIAGNOSIS — R5383 Other fatigue: Secondary | ICD-10-CM | POA: Diagnosis not present

## 2016-08-28 DIAGNOSIS — Z79891 Long term (current) use of opiate analgesic: Secondary | ICD-10-CM | POA: Diagnosis not present

## 2016-08-28 DIAGNOSIS — N400 Enlarged prostate without lower urinary tract symptoms: Secondary | ICD-10-CM | POA: Diagnosis not present

## 2016-08-28 DIAGNOSIS — E7800 Pure hypercholesterolemia, unspecified: Secondary | ICD-10-CM | POA: Diagnosis not present

## 2016-08-28 DIAGNOSIS — I10 Essential (primary) hypertension: Secondary | ICD-10-CM | POA: Diagnosis not present

## 2016-08-28 DIAGNOSIS — R7989 Other specified abnormal findings of blood chemistry: Secondary | ICD-10-CM | POA: Diagnosis not present

## 2016-08-28 DIAGNOSIS — R079 Chest pain, unspecified: Secondary | ICD-10-CM | POA: Diagnosis not present

## 2016-08-28 DIAGNOSIS — R609 Edema, unspecified: Secondary | ICD-10-CM | POA: Diagnosis not present

## 2016-09-04 DIAGNOSIS — J449 Chronic obstructive pulmonary disease, unspecified: Secondary | ICD-10-CM | POA: Diagnosis not present

## 2016-09-04 DIAGNOSIS — E784 Other hyperlipidemia: Secondary | ICD-10-CM | POA: Diagnosis not present

## 2016-09-04 DIAGNOSIS — F05 Delirium due to known physiological condition: Secondary | ICD-10-CM | POA: Diagnosis not present

## 2016-09-04 DIAGNOSIS — R609 Edema, unspecified: Secondary | ICD-10-CM | POA: Diagnosis not present

## 2016-09-04 DIAGNOSIS — L89152 Pressure ulcer of sacral region, stage 2: Secondary | ICD-10-CM | POA: Diagnosis not present

## 2016-09-04 DIAGNOSIS — I10 Essential (primary) hypertension: Secondary | ICD-10-CM | POA: Diagnosis not present

## 2016-09-04 DIAGNOSIS — R739 Hyperglycemia, unspecified: Secondary | ICD-10-CM | POA: Diagnosis not present

## 2016-09-04 DIAGNOSIS — M129 Arthropathy, unspecified: Secondary | ICD-10-CM | POA: Diagnosis not present

## 2016-09-04 DIAGNOSIS — N400 Enlarged prostate without lower urinary tract symptoms: Secondary | ICD-10-CM | POA: Diagnosis not present

## 2016-09-06 DIAGNOSIS — R609 Edema, unspecified: Secondary | ICD-10-CM | POA: Diagnosis not present

## 2016-09-06 DIAGNOSIS — L89152 Pressure ulcer of sacral region, stage 2: Secondary | ICD-10-CM | POA: Diagnosis not present

## 2016-09-06 DIAGNOSIS — R739 Hyperglycemia, unspecified: Secondary | ICD-10-CM | POA: Diagnosis not present

## 2016-09-06 DIAGNOSIS — N400 Enlarged prostate without lower urinary tract symptoms: Secondary | ICD-10-CM | POA: Diagnosis not present

## 2016-09-06 DIAGNOSIS — J449 Chronic obstructive pulmonary disease, unspecified: Secondary | ICD-10-CM | POA: Diagnosis not present

## 2016-09-06 DIAGNOSIS — I10 Essential (primary) hypertension: Secondary | ICD-10-CM | POA: Diagnosis not present

## 2016-09-06 DIAGNOSIS — E784 Other hyperlipidemia: Secondary | ICD-10-CM | POA: Diagnosis not present

## 2016-09-06 DIAGNOSIS — F05 Delirium due to known physiological condition: Secondary | ICD-10-CM | POA: Diagnosis not present

## 2016-09-06 DIAGNOSIS — M129 Arthropathy, unspecified: Secondary | ICD-10-CM | POA: Diagnosis not present

## 2016-09-12 ENCOUNTER — Observation Stay (HOSPITAL_COMMUNITY)
Admission: EM | Admit: 2016-09-12 | Discharge: 2016-09-13 | Disposition: A | Discharge status: Home / Self Care | Payer: Commercial Managed Care - HMO | Attending: Internal Medicine | Admitting: Internal Medicine

## 2016-09-12 ENCOUNTER — Emergency Department (HOSPITAL_COMMUNITY): Payer: Commercial Managed Care - HMO

## 2016-09-12 ENCOUNTER — Encounter (HOSPITAL_COMMUNITY): Payer: Self-pay | Admitting: Emergency Medicine

## 2016-09-12 DIAGNOSIS — Z87891 Personal history of nicotine dependence: Secondary | ICD-10-CM | POA: Insufficient documentation

## 2016-09-12 DIAGNOSIS — R51 Headache: Secondary | ICD-10-CM | POA: Diagnosis not present

## 2016-09-12 DIAGNOSIS — R4182 Altered mental status, unspecified: Secondary | ICD-10-CM | POA: Diagnosis not present

## 2016-09-12 DIAGNOSIS — J441 Chronic obstructive pulmonary disease with (acute) exacerbation: Secondary | ICD-10-CM | POA: Diagnosis present

## 2016-09-12 DIAGNOSIS — Z79899 Other long term (current) drug therapy: Secondary | ICD-10-CM | POA: Diagnosis not present

## 2016-09-12 DIAGNOSIS — R0789 Other chest pain: Secondary | ICD-10-CM | POA: Diagnosis not present

## 2016-09-12 DIAGNOSIS — R443 Hallucinations, unspecified: Secondary | ICD-10-CM | POA: Diagnosis present

## 2016-09-12 DIAGNOSIS — F0390 Unspecified dementia without behavioral disturbance: Secondary | ICD-10-CM | POA: Diagnosis present

## 2016-09-12 DIAGNOSIS — I2583 Coronary atherosclerosis due to lipid rich plaque: Secondary | ICD-10-CM

## 2016-09-12 DIAGNOSIS — N289 Disorder of kidney and ureter, unspecified: Principal | ICD-10-CM | POA: Insufficient documentation

## 2016-09-12 DIAGNOSIS — I2510 Atherosclerotic heart disease of native coronary artery without angina pectoris: Secondary | ICD-10-CM | POA: Diagnosis not present

## 2016-09-12 DIAGNOSIS — R079 Chest pain, unspecified: Secondary | ICD-10-CM | POA: Diagnosis present

## 2016-09-12 DIAGNOSIS — R0602 Shortness of breath: Secondary | ICD-10-CM | POA: Diagnosis not present

## 2016-09-12 DIAGNOSIS — I10 Essential (primary) hypertension: Secondary | ICD-10-CM | POA: Diagnosis present

## 2016-09-12 DIAGNOSIS — Z7982 Long term (current) use of aspirin: Secondary | ICD-10-CM | POA: Diagnosis not present

## 2016-09-12 DIAGNOSIS — G9340 Encephalopathy, unspecified: Secondary | ICD-10-CM | POA: Diagnosis present

## 2016-09-12 DIAGNOSIS — J449 Chronic obstructive pulmonary disease, unspecified: Secondary | ICD-10-CM | POA: Insufficient documentation

## 2016-09-12 LAB — COMPREHENSIVE METABOLIC PANEL
ALBUMIN: 3.5 g/dL (ref 3.5–5.0)
ALK PHOS: 40 U/L (ref 38–126)
ALT: 12 U/L — ABNORMAL LOW (ref 17–63)
AST: 14 U/L — ABNORMAL LOW (ref 15–41)
Anion gap: 8 (ref 5–15)
BILIRUBIN TOTAL: 0.9 mg/dL (ref 0.3–1.2)
BUN: 21 mg/dL — AB (ref 6–20)
CALCIUM: 9 mg/dL (ref 8.9–10.3)
CO2: 25 mmol/L (ref 22–32)
CREATININE: 1.34 mg/dL — AB (ref 0.61–1.24)
Chloride: 104 mmol/L (ref 101–111)
GFR calc Af Amer: 54 mL/min — ABNORMAL LOW (ref >60)
GFR, EST NON AFRICAN AMERICAN: 46 mL/min — AB (ref >60)
GLUCOSE: 98 mg/dL (ref 65–99)
Potassium: 3.9 mmol/L (ref 3.5–5.1)
Sodium: 137 mmol/L (ref 135–145)
TOTAL PROTEIN: 5.7 g/dL — AB (ref 6.5–8.1)

## 2016-09-12 LAB — DIFFERENTIAL
BASOS ABS: 0 10*3/uL (ref 0.0–0.1)
Basophils Relative: 0 %
Eosinophils Absolute: 0.1 10*3/uL (ref 0.0–0.7)
Eosinophils Relative: 2 %
LYMPHS ABS: 2 10*3/uL (ref 0.7–4.0)
LYMPHS PCT: 25 %
MONOS PCT: 4 %
Monocytes Absolute: 0.3 10*3/uL (ref 0.1–1.0)
NEUTROS PCT: 69 %
Neutro Abs: 5.4 10*3/uL (ref 1.7–7.7)

## 2016-09-12 LAB — CBC
HEMATOCRIT: 40.4 % (ref 39.0–52.0)
HEMOGLOBIN: 13.7 g/dL (ref 13.0–17.0)
MCH: 29.7 pg (ref 26.0–34.0)
MCHC: 33.9 g/dL (ref 30.0–36.0)
MCV: 87.4 fL (ref 78.0–100.0)
Platelets: 215 10*3/uL (ref 150–400)
RBC: 4.62 MIL/uL (ref 4.22–5.81)
RDW: 14.2 % (ref 11.5–15.5)
WBC: 7.8 10*3/uL (ref 4.0–10.5)

## 2016-09-12 LAB — URINALYSIS, ROUTINE W REFLEX MICROSCOPIC
GLUCOSE, UA: NEGATIVE mg/dL
Hgb urine dipstick: NEGATIVE
KETONES UR: NEGATIVE mg/dL
LEUKOCYTES UA: NEGATIVE
NITRITE: NEGATIVE
PROTEIN: NEGATIVE mg/dL
Specific Gravity, Urine: 1.024 (ref 1.005–1.030)
pH: 5.5 (ref 5.0–8.0)

## 2016-09-12 LAB — RAPID URINE DRUG SCREEN, HOSP PERFORMED
Amphetamines: NOT DETECTED
BARBITURATES: NOT DETECTED
Benzodiazepines: NOT DETECTED
Cocaine: NOT DETECTED
Opiates: NOT DETECTED
TETRAHYDROCANNABINOL: NOT DETECTED

## 2016-09-12 LAB — APTT: APTT: 29 s (ref 24–36)

## 2016-09-12 LAB — I-STAT TROPONIN, ED: TROPONIN I, POC: 0 ng/mL (ref 0.00–0.08)

## 2016-09-12 LAB — PROTIME-INR
INR: 1.1
Prothrombin Time: 14.2 seconds (ref 11.4–15.2)

## 2016-09-12 LAB — ETHANOL: Alcohol, Ethyl (B): <5 mg/dL (ref <5)

## 2016-09-12 LAB — CK: CK TOTAL: 43 U/L — AB (ref 49–397)

## 2016-09-12 MED ORDER — ASPIRIN 81 MG PO CHEW
324.0000 mg | CHEWABLE_TABLET | Freq: Once | ORAL | Status: AC
  Administered 2016-09-13: 324 mg via ORAL
  Filled 2016-09-12: qty 4

## 2016-09-12 MED ORDER — IPRATROPIUM-ALBUTEROL 0.5-2.5 (3) MG/3ML IN SOLN
3.0000 mL | Freq: Once | RESPIRATORY_TRACT | Status: AC
  Administered 2016-09-13: 3 mL via RESPIRATORY_TRACT
  Filled 2016-09-12: qty 3

## 2016-09-12 NOTE — ED Notes (Signed)
Patient transported to CT 

## 2016-09-12 NOTE — ED Triage Notes (Signed)
Per [facility], pt from home, pt wifes states hes had hallucinations for a month, seeing animals that aren't there,. C/o generalized weakness x2 weeks and dark urine x1 week. Pt had urinated on himself. Pt is AAOX4 for ems.

## 2016-09-12 NOTE — ED Provider Notes (Signed)
[facility] DEPT Provider Note   CSN: [id number] Arrival date & time: 09/12/16  1842     History   Chief Complaint Chief Complaint  Patient presents with  . Hallucinations    LEVEL 5 CAVEAT DUE TO ALTERED MENTAL STATUS   HPI Terry Taylor is a 82 y.o. male.  The history is provided by the patient and a relative. The history is limited by the condition of the patient.  Altered Mental Status   This is a recurrent problem. The current episode started more than 2 days ago. The problem has been gradually worsening. Associated symptoms include confusion.  Patient presents with increased confusion over past month Today it worsened and pt is having difficulty ambulating No falls No new meds He has reported HA and also chest pain and shortness of breath over the previous 24 hours  Past Medical History:  Diagnosis Date  . Benign prostatic hypertrophy    hx  . Chronic airway obstruction, not elsewhere classified   . Coronary atherosclerosis of unspecified type of vessel, native or graft   . Osteoarthrosis, unspecified whether generalized or localized, unspecified site   . Pure hypercholesterolemia    IIA  . Unspecified essential hypertension     Patient Active Problem List   Diagnosis Date Noted  . Fever 03/07/2013  . Acute encephalopathy 03/07/2013  . Weakness generalized 03/07/2013  . HYPERCHOLESTEROLEMIA  IIA 04/09/2009  . HYPERTENSION, UNSPECIFIED 04/09/2009  . CAD, UNSPECIFIED SITE 04/09/2009  . COPD 04/09/2009  . OSTEOARTHRITIS 04/09/2009  . BENIGN PROSTATIC HYPERTROPHY, HX OF 04/09/2009    History reviewed. No pertinent surgical history.     Home Medications    Prior to Admission medications   Medication Sig Start Date End Date Taking? Authorizing Provider  amLODipine (NORVASC) 10 MG tablet Take 1 tablet (10 mg total) by mouth daily. 03/11/13   Bynum Bellows, MD  aspirin 325 MG buffered tablet Take 325 mg by mouth daily.    Historical Provider, MD    Fluticasone-Salmeterol (ADVAIR) 250-50 MCG/DOSE AEPB Inhale 1 puff into the lungs daily as needed.    Historical Provider, MD  folic acid (FOLVITE) 1 MG tablet Take 1 tablet (1 mg total) by mouth daily. 03/10/13   Bynum Bellows, MD  Linaclotide (LINZESS) 145 MCG CAPS Take 145 mcg by mouth daily.    Historical Provider, MD  losartan (COZAAR) 50 MG tablet Take 1 tablet (50 mg total) by mouth daily. 03/10/13   Srikar Janna Arch, MD  omega-3 acid ethyl esters (LOVAZA) 1 G capsule Take 2 g by mouth 2 (two) times daily.    Historical Provider, MD  tamsulosin (FLOMAX) 0.4 MG CAPS Take 0.4 mg by mouth daily.    Historical Provider, MD  thiamine 100 MG tablet Take 1 tablet (100 mg total) by mouth daily. 03/10/13   Bynum Bellows, MD  tiotropium (SPIRIVA HANDIHALER) 18 MCG inhalation capsule Place 18 mcg into inhaler and inhale daily.    Historical Provider, MD  traMADol (ULTRAM) 50 MG tablet Take 50 mg by mouth every 6 (six) hours as needed for pain. For pain    Historical Provider, MD    Family History Family History  Problem Relation Age of Onset  . Coronary artery disease      family hx    Social History Social History  Substance Use Topics  . Smoking status: Former Smoker    Years: 40.00    Types: Cigarettes  . Smokeless tobacco: Not on file  Comment: quit in 1979  . Alcohol use Yes     Comment: 1/day     Allergies   Review of patient's allergies indicates no known allergies.   Review of Systems Review of Systems  Unable to perform ROS: Mental status change  Psychiatric/Behavioral: Positive for confusion.     Physical Exam Updated Vital Signs BP 131/77   Pulse 63   Temp 98.8 F (37.1 C) (Oral)   Resp 16   SpO2 95%   Physical Exam CONSTITUTIONAL: Elderly, frail HEAD: Normocephalic/atraumatic EYES: EOMI ENMT: Mucous membranes moist NECK: supple no meningeal signs SPINE/BACK:entire spine nontender CV: S1/S2 noted, no murmurs/rubs/gallops noted LUNGS: coarse BS noted  bilaterally ABDOMEN: soft, nontender GU:no cva tenderness NEURO: Pt is awake/alert.  He appears confused.  He moves all extremities without any focal weakness EXTREMITIES: pulses normal/equal, full ROM, no deformities to extremities noted SKIN: warm, color normal PSYCH: unable to assess   ED Treatments / Results  Labs (all labs ordered are listed, but only abnormal results are displayed) Labs Reviewed  COMPREHENSIVE METABOLIC PANEL - Abnormal; Notable for the following:       Result Value   BUN 21 (*)    Creatinine, Ser 1.34 (*)    Total Protein 5.7 (*)    AST 14 (*)    ALT 12 (*)    GFR calc non Af Amer 46 (*)    GFR calc Af Amer 54 (*)    All other components within normal limits  URINALYSIS, ROUTINE W REFLEX MICROSCOPIC (NOT AT [facility]) - Abnormal; Notable for the following:    Bilirubin Urine SMALL (*)    All other components within normal limits  CK - Abnormal; Notable for the following:    Total CK 43 (*)    All other components within normal limits  ETHANOL  PROTIME-INR  APTT  CBC  DIFFERENTIAL  URINE RAPID DRUG SCREEN, HOSP PERFORMED  I-STAT TROPOININ, ED    EKG  EKG Interpretation  Date/Time:  Wednesday September 12 2016 18:52:03 EDT Ventricular Rate:  65 PR Interval:    QRS Duration: 93 QT Interval:  419 QTC Calculation: 436 R Axis:   27 Text Interpretation:  Sinus rhythm Posterior infarct, old No significant change since last tracing Confirmed by Christy Gentles  MD, Laural Eiland ([id number]) on 09/12/2016 7:02:23 PM       Radiology Ct Head Wo Contrast  Result Date: 09/12/2016 CLINICAL DATA:  Headache. EXAM: CT HEAD WITHOUT CONTRAST TECHNIQUE: Contiguous axial images were obtained from the base of the skull through the vertex without intravenous contrast. COMPARISON:  MRI of October 08, 2014.  CT scan of March 06, 2013. FINDINGS: Bony calvarium appears intact. Visualized paranasal sinuses appear normal. Moderate diffuse cortical atrophy is noted. No mass effect or midline  shift is noted. Stable ventricular enlargement is noted most likely due to surrounding atrophy. There is no evidence of mass lesion, hemorrhage or acute infarction. IMPRESSION: Moderate diffuse cortical atrophy. No acute intracranial abnormality seen. Electronically Signed   By: Marijo Conception, M.D.   On: 09/12/2016 21:51   Dg Chest Portable 1 View  Result Date: 09/12/2016 CLINICAL DATA:  Shortness of breath. EXAM: PORTABLE CHEST 1 VIEW COMPARISON:  Radiograph of October 06, 2014. FINDINGS: The heart size and mediastinal contours are within normal limits. Both lungs are clear. No pneumothorax or pleural effusion is noted. Degenerative changes seen involving both glenohumeral joints. IMPRESSION: No acute cardiopulmonary abnormality seen. Electronically Signed   By: Marijo Conception, M.D.  On: 09/12/2016 20:52    Procedures Procedures (including critical care time)  Medications Ordered in ED Medications  ipratropium-albuterol (DUONEB) 0.5-2.5 (3) MG/3ML nebulizer solution 3 mL (not administered)  aspirin chewable tablet 324 mg (not administered)     Initial Impression / Assessment and Plan / ED Course  I have reviewed the triage vital signs and the nursing notes.  Pertinent labs & imaging results that were available during my care of the patient were reviewed by me and considered in my medical decision making (see chart for details).  Clinical Course    Pt here with increased confusion now having difficulty ambulating Imaging/labs pending at this time 11:42 PM After discussion with family, the main issue was patient was reporting CP/SOB today.  He also reported dyspnea on exertion which is new for him His confusion will wax/wane but they were most concerned about his CP Due to age/risk factors, we have decided to admit patient for chest pain rule out MI D/w dr danford for admission Final Clinical Impressions(s) / ED Diagnoses   Final diagnoses:  Chest pain, rule out acute myocardial  infarction  Renal insufficiency    New Prescriptions New Prescriptions   No medications on file     Ripley Fraise, MD 09/12/16 2350

## 2016-09-12 NOTE — ED Notes (Signed)
Dr. Wickline at the bedside.  

## 2016-09-13 ENCOUNTER — Encounter (HOSPITAL_COMMUNITY): Payer: Self-pay | Admitting: Family Medicine

## 2016-09-13 ENCOUNTER — Telehealth: Payer: Self-pay | Admitting: Cardiology

## 2016-09-13 ENCOUNTER — Observation Stay (HOSPITAL_BASED_OUTPATIENT_CLINIC_OR_DEPARTMENT_OTHER): Payer: Commercial Managed Care - HMO

## 2016-09-13 DIAGNOSIS — I2510 Atherosclerotic heart disease of native coronary artery without angina pectoris: Secondary | ICD-10-CM | POA: Diagnosis not present

## 2016-09-13 DIAGNOSIS — I10 Essential (primary) hypertension: Secondary | ICD-10-CM | POA: Diagnosis not present

## 2016-09-13 DIAGNOSIS — G9340 Encephalopathy, unspecified: Secondary | ICD-10-CM | POA: Diagnosis not present

## 2016-09-13 DIAGNOSIS — R079 Chest pain, unspecified: Secondary | ICD-10-CM | POA: Diagnosis not present

## 2016-09-13 DIAGNOSIS — J449 Chronic obstructive pulmonary disease, unspecified: Secondary | ICD-10-CM | POA: Diagnosis not present

## 2016-09-13 DIAGNOSIS — F0390 Unspecified dementia without behavioral disturbance: Secondary | ICD-10-CM | POA: Diagnosis present

## 2016-09-13 LAB — ECHOCARDIOGRAM COMPLETE
HEIGHTINCHES: 70 in
WEIGHTICAEL: 2566.4 [oz_av]

## 2016-09-13 LAB — D-DIMER, QUANTITATIVE (NOT AT ARMC): D DIMER QUANT: 0.44 ug{FEU}/mL (ref 0.00–0.50)

## 2016-09-13 LAB — TROPONIN I
Troponin I: <0.03 ng/mL (ref <0.03)
Troponin I: <0.03 ng/mL (ref <0.03)
Troponin I: <0.03 ng/mL (ref <0.03)

## 2016-09-13 MED ORDER — FINASTERIDE 5 MG PO TABS
5.0000 mg | ORAL_TABLET | Freq: Every day | ORAL | Status: DC
  Administered 2016-09-13: 5 mg via ORAL
  Filled 2016-09-13: qty 1

## 2016-09-13 MED ORDER — ENOXAPARIN SODIUM 40 MG/0.4ML ~~LOC~~ SOLN
40.0000 mg | SUBCUTANEOUS | Status: DC
  Filled 2016-09-13: qty 0.4

## 2016-09-13 MED ORDER — FINASTERIDE 5 MG PO TABS: 5.0000 mg | ORAL_TABLET | Freq: Every day | ORAL | 0 refills | Status: DC

## 2016-09-13 MED ORDER — PNEUMOCOCCAL VAC POLYVALENT 25 MCG/0.5ML IJ INJ: 0.5000 mL | INJECTION | INTRAMUSCULAR | Status: DC | PRN

## 2016-09-13 MED ORDER — AMLODIPINE BESYLATE 10 MG PO TABS: 5.0000 mg | ORAL_TABLET | Freq: Every day | ORAL | 0 refills | Status: DC

## 2016-09-13 MED ORDER — TRAMADOL HCL 50 MG PO TABS
50.0000 mg | ORAL_TABLET | Freq: Four times a day (QID) | ORAL | Status: DC | PRN
  Administered 2016-09-13: 50 mg via ORAL
  Filled 2016-09-13: qty 1

## 2016-09-13 MED ORDER — ASPIRIN EC 81 MG PO TBEC
81.0000 mg | DELAYED_RELEASE_TABLET | Freq: Every day | ORAL | Status: DC
  Administered 2016-09-13: 81 mg via ORAL
  Filled 2016-09-13: qty 1

## 2016-09-13 MED ORDER — PRAVASTATIN SODIUM 40 MG PO TABS
40.0000 mg | ORAL_TABLET | Freq: Every day | ORAL | Status: DC
  Administered 2016-09-13: 40 mg via ORAL
  Filled 2016-09-13: qty 1

## 2016-09-13 MED ORDER — MELOXICAM 7.5 MG PO TABS: 7.5000 mg | ORAL_TABLET | Freq: Every day | ORAL | Status: DC

## 2016-09-13 MED ORDER — ISOSORBIDE MONONITRATE ER 30 MG PO TB24: 15.0000 mg | ORAL_TABLET | Freq: Every day | ORAL | 0 refills | Status: AC

## 2016-09-13 MED ORDER — ALBUTEROL SULFATE (2.5 MG/3ML) 0.083% IN NEBU: 2.5000 mg | INHALATION_SOLUTION | Freq: Four times a day (QID) | RESPIRATORY_TRACT | Status: DC | PRN

## 2016-09-13 MED ORDER — ONDANSETRON HCL 4 MG/2ML IJ SOLN: 4.0000 mg | Freq: Four times a day (QID) | INTRAMUSCULAR | Status: DC | PRN

## 2016-09-13 MED ORDER — ACETAMINOPHEN 325 MG PO TABS: 650.0000 mg | ORAL_TABLET | ORAL | Status: DC | PRN

## 2016-09-13 MED ORDER — TAMSULOSIN HCL 0.4 MG PO CAPS
0.4000 mg | ORAL_CAPSULE | Freq: Every day | ORAL | Status: DC
  Administered 2016-09-13: 0.4 mg via ORAL
  Filled 2016-09-13: qty 1

## 2016-09-13 MED ORDER — LOSARTAN POTASSIUM 50 MG PO TABS
50.0000 mg | ORAL_TABLET | Freq: Every day | ORAL | Status: DC
  Administered 2016-09-13: 50 mg via ORAL
  Filled 2016-09-13: qty 1

## 2016-09-13 MED ORDER — INFLUENZA VAC SPLIT QUAD 0.5 ML IM SUSY
0.5000 mL | PREFILLED_SYRINGE | INTRAMUSCULAR | Status: DC | PRN
Start: 2016-09-13 — End: 2016-09-13

## 2016-09-13 MED ORDER — AMLODIPINE BESYLATE 5 MG PO TABS
5.0000 mg | ORAL_TABLET | Freq: Every day | ORAL | Status: DC
  Administered 2016-09-13: 5 mg via ORAL
  Filled 2016-09-13: qty 1

## 2016-09-13 MED ORDER — ISOSORBIDE MONONITRATE ER 30 MG PO TB24
15.0000 mg | ORAL_TABLET | Freq: Every day | ORAL | Status: DC
  Administered 2016-09-13: 15 mg via ORAL
  Filled 2016-09-13: qty 1

## 2016-09-13 NOTE — Progress Notes (Signed)
2D echo normal with normal LVF.  No further cardiac workup at this time.  Medical management.  Long acting nitrates added in addition to ASA and statin.We will scheudule followup in our office.

## 2016-09-13 NOTE — Care Management Note (Signed)
Case Management Note  Patient Details  Name: Terry Taylor MRN: [id number] Date of Birth: Oct 09, 1928  Subjective/Objective:     Pt presented for AMS and chest pain. Son at bedside at time of conversation. Pt or son not aware of name of [facility]. CM did reach to PCP office to get the name of [facility] Provider. Pt is active with [facility]. Pt is from home with wife.              Action/Plan: CM to call and verify services with above agency. Pt will benefit from PT,OT, RN, SW and [employer]. CSW wroking with pt in regards to APS report. Fax # to [employer] is [phone].    Expected Discharge Date:                  Expected Discharge Plan:  [place]  In-House Referral:  Clinical Social Work  Discharge planning Services  CM Consult  Post Acute Care Choice:  Home Health Choice offered to:  Adult Children, Patient  DME Arranged:  N/A DME Agency:  NA  HH Arranged:   RN,PT,OT Aide, SW HH Agency:  [facility]  Status of Service:  Completed If discussed at [place] of Stay Meetings, dates discussed:    Additional Comments:  Bethena Roys, RN 09/13/2016, 3:12 PM

## 2016-09-13 NOTE — Telephone Encounter (Signed)
New message        TCM on 09-20-16 with brittiany simmons per vin

## 2016-09-13 NOTE — Progress Notes (Signed)
  Echocardiogram 2D Echocardiogram has been performed.  Darlina Sicilian M 09/13/2016, 12:49 PM

## 2016-09-13 NOTE — Discharge Summary (Addendum)
PATIENT DETAILS Name: Terry Taylor Age: 82 y.o. Sex: male Date of Birth: 09-02-1928 MRN: [id number]. Admitting Physician: Edwin Dada, MD PCP:No primary care provider on file.  Admit Date: 09/12/2016 Discharge date: 09/13/2016  Recommendations for Outpatient Follow-up:  1. Follow up with PCP in 1-2 weeks 2. Please obtain BMP/CBC in one week 3. New Medications: Amlodipine, Imdur  Admitted From:  Home  Disposition: [place]: No  Equipment/Devices: None  Discharge Condition: Guarded  CODE STATUS: FULL CODE  Diet recommendation:  Heart Healthy   Brief Summary: See H&P, Labs, Consult and Test reports for all details in brief, patient is a 82 y.o. male with a history of Nonobstructive CAD, hypertension, hyperlipidemia, COPD, BPH who presented with chest pain  Brief Hospital Course: Chest Pain:with both typical and atypical features, but with dementia-limited history. Patient is very limited functionally-and not a candidate for further work up. Troponins/D dimer was negative. Discussed with cardiology, plans are to manage medically. Continue ASA, we have added Imdur and Amlodipine. Not on beta blocker due to bradycardia. 2 D Echo showed preserved EF without any wall motion abnormalities.  Uncontrolled HTN: continue Losartan-have added Amlodipine and Imdur. Follow and optimize in the outpatient setting.   BPH: already on flomax-apparently continues to have prostatism symp-have added Finasteride-follow up with PCP. This is a ongoing issue for the past few years  Dyslipidemia: continue Statin  Probable Dementia: further work up deferred to the outpatient setting.   Rest of his medical problems were stable during this short overnight hospital observation stay  Procedures/Studies: Echo 9/14>>f 55% to 60%. Wall motion was normal; there were no regional wall motion  abnormalities.  Discharge Diagnoses:  Principal Problem:   Chest pain, rule out  acute myocardial infarction Active Problems:   Essential hypertension   Coronary artery disease due to lipid rich plaque   COPD (chronic obstructive pulmonary disease) (HCC)   Acute encephalopathy   Dementia   Discharge Instructions:  Activity:  As tolerated with Full fall precautions use walker/cane & assistance as needed   Discharge Instructions    Call MD for:  severe uncontrolled pain    Complete by:  As directed    Diet - low sodium heart healthy    Complete by:  As directed    Discharge instructions    Complete by:  As directed    Follow with Primary MD  No primary care provider on file.  and other consultant's as instructed your Hospitalist MD  Please get a complete blood count and chemistry panel checked by your Primary MD at your next visit, and again as instructed by your Primary MD.  Get Medicines reviewed and adjusted: Please take all your medications with you for your next visit with your Primary MD  Laboratory/radiological data: Please request your Primary MD to go over all hospital tests and procedure/radiological results at the follow up, please ask your Primary MD to get all Hospital records sent to his/her office.  In some cases, they will be blood work, cultures and biopsy results pending at the time of your discharge. Please request that your primary care M.D. follows up on these results.  Also Note the following: If you experience worsening of your admission symptoms, develop shortness of breath, life threatening emergency, suicidal or homicidal thoughts you must seek medical attention immediately by calling 911 or calling your MD immediately  if symptoms less severe.  You must read complete instructions/literature along with all the possible adverse reactions/side  effects for all the Medicines you take and that have been prescribed to you. Take any new Medicines after you have completely understood and accpet all the possible adverse reactions/side effects.     Do not drive when taking Pain medications or sleeping medications (Benzodaizepines)  Do not take more than prescribed Pain, Sleep and Anxiety Medications. It is not advisable to combine anxiety,sleep and pain medications without talking with your primary care practitioner  Special Instructions: If you have smoked or chewed Tobacco  in the last 2 yrs please stop smoking, stop any regular Alcohol  and or any Recreational drug use.  Wear Seat belts while driving.  Please note: You were cared for by a hospitalist during your hospital stay. Once you are discharged, your primary care physician will handle any further medical issues. Please note that NO REFILLS for any discharge medications will be authorized once you are discharged, as it is imperative that you return to your primary care physician (or establish a relationship with a primary care physician if you do not have one) for your post hospital discharge needs so that they can reassess your need for medications and monitor your lab values.   Increase activity slowly    Complete by:  As directed        Medication List    TAKE these medications   albuterol (2.5 MG/3ML) 0.083% nebulizer solution Commonly known as:  PROVENTIL Take 2.5 mg by nebulization every 6 (six) hours as needed for wheezing or shortness of breath.   amLODipine 10 MG tablet Commonly known as:  NORVASC Take 0.5 tablets (5 mg total) by mouth daily. Start taking on:  09/14/2016   aspirin EC 81 MG tablet Take 81 mg by mouth daily.   cholecalciferol 1000 units tablet Commonly known as:  VITAMIN D Take 1,000 Units by mouth daily.   finasteride 5 MG tablet Commonly known as:  PROSCAR Take 1 tablet (5 mg total) by mouth daily. Start taking on:  09/14/2016   Fluticasone-Salmeterol 250-50 MCG/DOSE Aepb Commonly known as:  ADVAIR Inhale 1 puff into the lungs daily as needed.   isosorbide mononitrate 30 MG 24 hr tablet Commonly known as:  IMDUR Take 0.5 tablets  (15 mg total) by mouth daily.   losartan 50 MG tablet Commonly known as:  COZAAR Take 1 tablet (50 mg total) by mouth daily.   meloxicam 7.5 MG tablet Commonly known as:  MOBIC Take 7.5 mg by mouth daily.   multivitamin with minerals Tabs tablet Take 1 tablet by mouth daily.   pravastatin 40 MG tablet Commonly known as:  PRAVACHOL Take 40 mg by mouth daily.   SPIRIVA HANDIHALER 18 MCG inhalation capsule Generic drug:  tiotropium Place 18 mcg into inhaler and inhale daily.   tamsulosin 0.4 MG Caps capsule Commonly known as:  FLOMAX Take 0.4 mg by mouth daily.   traMADol 50 MG tablet Commonly known as:  ULTRAM Take 50 mg by mouth every 6 (six) hours as needed for pain. For pain      Follow-up Information    Primary Care MD. Schedule an appointment as soon as possible for a visit in 1 week(s).          No Known Allergies  Consultations:   cardiology  Other Procedures/Studies: Ct Head Wo Contrast  Result Date: 09/12/2016 CLINICAL DATA:  Headache. EXAM: CT HEAD WITHOUT CONTRAST TECHNIQUE: Contiguous axial images were obtained from the base of the skull through the vertex without intravenous contrast. COMPARISON:  MRI  of October 08, 2014.  CT scan of March 06, 2013. FINDINGS: Bony calvarium appears intact. Visualized paranasal sinuses appear normal. Moderate diffuse cortical atrophy is noted. No mass effect or midline shift is noted. Stable ventricular enlargement is noted most likely due to surrounding atrophy. There is no evidence of mass lesion, hemorrhage or acute infarction. IMPRESSION: Moderate diffuse cortical atrophy. No acute intracranial abnormality seen. Electronically Signed   By: Marijo Conception, M.D.   On: 09/12/2016 21:51   Dg Chest Portable 1 View  Result Date: 09/12/2016 CLINICAL DATA:  Shortness of breath. EXAM: PORTABLE CHEST 1 VIEW COMPARISON:  Radiograph of October 06, 2014. FINDINGS: The heart size and mediastinal contours are within normal limits. Both  lungs are clear. No pneumothorax or pleural effusion is noted. Degenerative changes seen involving both glenohumeral joints. IMPRESSION: No acute cardiopulmonary abnormality seen. Electronically Signed   By: Marijo Conception, M.D.   On: 09/12/2016 20:52     TODAY-DAY OF DISCHARGE:  Subjective:   Terry Taylor today has no headache,no chest abdominal pain,no new weakness tingling or numbness, feels much better wants to go home today.   Objective:   Blood pressure 137/70, pulse 64, temperature 97.8 F (36.6 C), temperature source Oral, resp. rate 11, height 5\' 10"  (1.778 m), weight 72.8 kg (160 lb 6.4 oz), SpO2 93 %.  Intake/Output Summary (Last 24 hours) at 09/13/16 1535 Last data filed at 09/13/16 1340  Gross per 24 hour  Intake              480 ml  Output              200 ml  Net              280 ml   Filed Weights   09/13/16 0144 09/13/16 0355  Weight: 73.8 kg (162 lb 9.6 oz) 72.8 kg (160 lb 6.4 oz)    Exam: Awake, mostly Alert, , No new F.N deficits, Normal affect [place].AT,PERRAL Supple Neck,No JVD, No cervical lymphadenopathy appriciated.  Symmetrical Chest wall movement, Good air movement bilaterally, CTAB RRR,No Gallops,Rubs or new Murmurs, No Parasternal Heave +ve B.Sounds, Abd Soft, Non tender, No organomegaly appriciated, No rebound -guarding or rigidity. No Cyanosis, Clubbing or edema, No new Rash or bruise   PERTINENT RADIOLOGIC STUDIES: Ct Head Wo Contrast  Result Date: 09/12/2016 CLINICAL DATA:  Headache. EXAM: CT HEAD WITHOUT CONTRAST TECHNIQUE: Contiguous axial images were obtained from the base of the skull through the vertex without intravenous contrast. COMPARISON:  MRI of October 08, 2014.  CT scan of March 06, 2013. FINDINGS: Bony calvarium appears intact. Visualized paranasal sinuses appear normal. Moderate diffuse cortical atrophy is noted. No mass effect or midline shift is noted. Stable ventricular enlargement is noted most likely due to surrounding  atrophy. There is no evidence of mass lesion, hemorrhage or acute infarction. IMPRESSION: Moderate diffuse cortical atrophy. No acute intracranial abnormality seen. Electronically Signed   By: Marijo Conception, M.D.   On: 09/12/2016 21:51   Dg Chest Portable 1 View  Result Date: 09/12/2016 CLINICAL DATA:  Shortness of breath. EXAM: PORTABLE CHEST 1 VIEW COMPARISON:  Radiograph of October 06, 2014. FINDINGS: The heart size and mediastinal contours are within normal limits. Both lungs are clear. No pneumothorax or pleural effusion is noted. Degenerative changes seen involving both glenohumeral joints. IMPRESSION: No acute cardiopulmonary abnormality seen. Electronically Signed   By: Marijo Conception, M.D.   On: 09/12/2016 20:52     PERTINENT  LAB RESULTS: CBC:  Recent Labs  09/12/16 2023  WBC 7.8  HGB 13.7  HCT 40.4  PLT 215   CMET CMP     Component Value Date/Time   NA 137 09/12/2016 2023   NA 138 10/08/2014 0527   K 3.9 09/12/2016 2023   K 3.8 10/08/2014 0527   CL 104 09/12/2016 2023   CL 106 10/08/2014 0527   CO2 25 09/12/2016 2023   CO2 24 10/08/2014 0527   GLUCOSE 98 09/12/2016 2023   GLUCOSE 77 10/08/2014 0527   BUN 21 (H) 09/12/2016 2023   BUN 20 (H) 10/08/2014 0527   CREATININE 1.34 (H) 09/12/2016 2023   CREATININE 1.21 10/08/2014 0527   CALCIUM 9.0 09/12/2016 2023   CALCIUM 7.9 (L) 10/08/2014 0527   PROT 5.7 (L) 09/12/2016 2023   ALBUMIN 3.5 09/12/2016 2023   AST 14 (L) 09/12/2016 2023   ALT 12 (L) 09/12/2016 2023   ALKPHOS 40 09/12/2016 2023   BILITOT 0.9 09/12/2016 2023   GFRNONAA 46 (L) 09/12/2016 2023   GFRNONAA >60 10/08/2014 0527   GFRAA 54 (L) 09/12/2016 2023   GFRAA >60 10/08/2014 0527    GFR Estimated Creatinine Clearance: 40.7 mL/min (by C-G formula based on SCr of 1.34 mg/dL (H)). No results for input(s): LIPASE, AMYLASE in the last 72 hours.  Recent Labs  09/12/16 2023 09/13/16 0510 09/13/16 0843 09/13/16 1137  CKTOTAL 43*  --   --   --     TROPONINI  --  <0.03 <0.03 <0.03   Invalid input(s): POCBNP  Recent Labs  09/13/16 0510  DDIMER 0.44   No results for input(s): HGBA1C in the last 72 hours. No results for input(s): CHOL, HDL, LDLCALC, TRIG, CHOLHDL, LDLDIRECT in the last 72 hours. No results for input(s): TSH, T4TOTAL, T3FREE, THYROIDAB in the last 72 hours.  Invalid input(s): FREET3 No results for input(s): VITAMINB12, FOLATE, FERRITIN, TIBC, IRON, RETICCTPCT in the last 72 hours. Coags:  Recent Labs  09/12/16 2023  INR 1.10   Microbiology: No results found for this or any previous visit (from the past 240 hour(s)).  FURTHER DISCHARGE INSTRUCTIONS:  Get Medicines reviewed and adjusted: Please take all your medications with you for your next visit with your Primary MD  Laboratory/radiological data: Please request your Primary MD to go over all hospital tests and procedure/radiological results at the follow up, please ask your Primary MD to get all Hospital records sent to his/her office.  In some cases, they will be blood work, cultures and biopsy results pending at the time of your discharge. Please request that your primary care M.D. goes through all the records of your hospital data and follows up on these results.  Also Note the following: If you experience worsening of your admission symptoms, develop shortness of breath, life threatening emergency, suicidal or homicidal thoughts you must seek medical attention immediately by calling 911 or calling your MD immediately  if symptoms less severe.  You must read complete instructions/literature along with all the possible adverse reactions/side effects for all the Medicines you take and that have been prescribed to you. Take any new Medicines after you have completely understood and accpet all the possible adverse reactions/side effects.   Do not drive when taking Pain medications or sleeping medications (Benzodaizepines)  Do not take more than  prescribed Pain, Sleep and Anxiety Medications. It is not advisable to combine anxiety,sleep and pain medications without talking with your primary care practitioner  Special Instructions: If you have smoked  or chewed Tobacco  in the last 2 yrs please stop smoking, stop any regular Alcohol  and or any Recreational drug use.  Wear Seat belts while driving.  Please note: You were cared for by a hospitalist during your hospital stay. Once you are discharged, your primary care physician will handle any further medical issues. Please note that NO REFILLS for any discharge medications will be authorized once you are discharged, as it is imperative that you return to your primary care physician (or establish a relationship with a primary care physician if you do not have one) for your post hospital discharge needs so that they can reassess your need for medications and monitor your lab values.  Total Time spent coordinating discharge including counseling, education and face to face time equals 35 minutes.  SignedOren Binet 09/13/2016 3:35 PM

## 2016-09-13 NOTE — Consult Note (Signed)
CARDIOLOGY CONSULT NOTE   Patient ID: DRENNAN BUSSIERE MRN: [id number] DOB/AGE: 80-30-1929 80 y.o.  Admit date: 09/12/2016  Primary Physician   No primary care provider on file. Primary Cardiologist   New Reason for Consultation   Chest pain Requesting Physician  Dr. Sloan Leiter  HPI: Terry Taylor is a 82 y.o. male with a history of Nonobstructive CAD, hypertension, hyperlipidemia, COPD, BPH and severe osteoporosis with osteoarthritis who presented for evaluation of chest pain.  Previously seen by Dr. Charolette Forward in 07/2008 for chest pain and shortness of breath. F/u exercise Myoview in August 2009 that showed a suggestion of inferior infarction with peri-infarct ischemia. Left heart  catheterization done in September 2009 showed nonobstructive coronary artery disease with a 30% distal circumflex stenosis, 40% first obtuse marginal stenosis, and an EF of 55% with no wall motion abnormalities.  LVEDP on this study was 15 mmHg.  Patient has a dementia who lives with wife. Questionable who is managing his medications. Mostly bedbound. Daughter at bedside provided most of the history. Brought to ER with 2 days history of intermittent chest pain. He describes the chest pain as a dull. No associated shortness of breath, nausea, or vomiting. Did have one episode of diaphoresis. No radiation of pain. Useing nebulizer more than often. In ER noted to have minimally elevated high blood pressure. There was a concern for EMS and CT and UDS are normal. EKG shows sinus rhythm at rate of 65 bpm. Troponin x 2 negative. Chest x-ray without acute cardiopulmonary disease. D-dimer negative. Serum creatinine of 1.34 with BUN of 21.     Past Medical History:  Diagnosis Date  . Benign prostatic hypertrophy    hx  . Chronic airway obstruction, not elsewhere classified   . Coronary atherosclerosis of unspecified type of vessel, native or graft   . Osteoarthrosis, unspecified whether generalized or localized,  unspecified site   . Pure hypercholesterolemia    IIA  . Unspecified essential hypertension      History reviewed. No pertinent surgical history.  No Known Allergies  I have reviewed the patient's current medications . aspirin EC  81 mg Oral Daily  . enoxaparin (LOVENOX) injection  40 mg Subcutaneous Q24H  . losartan  50 mg Oral Daily  . pravastatin  40 mg Oral Daily  . tamsulosin  0.4 mg Oral Daily     acetaminophen, albuterol, Influenza vac split quadrivalent PF, ondansetron (ZOFRAN) IV, pneumococcal 23 valent vaccine, traMADol  Prior to Admission medications   Medication Sig Start Date End Date Taking? Authorizing Provider  albuterol (PROVENTIL) (2.5 MG/3ML) 0.083% nebulizer solution Take 2.5 mg by nebulization every 6 (six) hours as needed for wheezing or shortness of breath.   Yes Historical Provider, MD  aspirin EC 81 MG tablet Take 81 mg by mouth daily.   Yes Historical Provider, MD  cholecalciferol (VITAMIN D) 1000 units tablet Take 1,000 Units by mouth daily.   Yes Historical Provider, MD  Fluticasone-Salmeterol (ADVAIR) 250-50 MCG/DOSE AEPB Inhale 1 puff into the lungs daily as needed.    Yes Historical Provider, MD  losartan (COZAAR) 50 MG tablet Take 1 tablet (50 mg total) by mouth daily. 03/10/13  Yes Srikar Janna Arch, MD  meloxicam (MOBIC) 7.5 MG tablet Take 7.5 mg by mouth daily.   Yes Historical Provider, MD  Multiple Vitamin (MULTIVITAMIN WITH MINERALS) TABS tablet Take 1 tablet by mouth daily.   Yes Historical Provider, MD  pravastatin (PRAVACHOL) 40 MG tablet Take 40 mg by mouth daily.  Yes Historical Provider, MD  tamsulosin (FLOMAX) 0.4 MG CAPS Take 0.4 mg by mouth daily.   Yes Historical Provider, MD  tiotropium (SPIRIVA HANDIHALER) 18 MCG inhalation capsule Place 18 mcg into inhaler and inhale daily.   Yes Historical Provider, MD  traMADol (ULTRAM) 50 MG tablet Take 50 mg by mouth every 6 (six) hours as needed for pain. For pain   Yes Historical Provider, MD       Social History   Social History  . Marital status: Married    Spouse name: N/A  . Number of children: N/A  . Years of education: N/A   Occupational History  . Not on file.   Social History Main Topics  . Smoking status: Former Smoker    Years: 40.00    Types: Cigarettes  . Smokeless tobacco: Never Used     Comment: quit in 1979  . Alcohol use Yes     Comment: 1/day  . Drug use: No  . Sexual activity: No   Other Topics Concern  . Not on file   Social History Narrative   Retired, married.     Family Status  Relation Status  .    Marland Kitchen Mother   . Father    Family History  Problem Relation Age of Onset  . Coronary artery disease      family hx  . Stroke Mother   . Stroke Father      ROS:  Full 14 point review of systems complete and found to be negative unless listed above.  Physical Exam: Blood pressure (!) 161/84, pulse 66, temperature 97.9 F (36.6 C), temperature source Oral, resp. rate 15, height 5\' 10"  (1.778 m), weight 160 lb 6.4 oz (72.8 kg), SpO2 95 %.  General: Frail, ill appearing male in no acute distress Head: Eyes PERRLA, No xanthomas. Normocephalic and atraumatic, oropharynx without edema or exudate.  Lungs: Resp regular and unlabored, CTA. Heart: RRR no s3, s4, or murmurs..   Neck: No carotid bruits. No lymphadenopathy. No JVD. Abdomen: Bowel sounds present, abdomen soft and non-tender without masses or hernias noted. Msk:  No spine or cva tenderness. No weakness, no joint deformities or effusions. Extremities: No clubbing, cyanosis or edema. DP/PT/Radials 2+ and equal bilaterally. Neuro: Alert and oriented X 3. No focal deficits noted. Psych:  Good affect, responds appropriately Skin: No rashes or lesions noted.  Labs:   Lab Results  Component Value Date   WBC 7.8 09/12/2016   HGB 13.7 09/12/2016   HCT 40.4 09/12/2016   MCV 87.4 09/12/2016   PLT 215 09/12/2016    Recent Labs  09/12/16 2023  INR 1.10    Recent Labs Lab  09/12/16 2023  NA 137  K 3.9  CL 104  CO2 25  BUN 21*  CREATININE 1.34*  CALCIUM 9.0  PROT 5.7*  BILITOT 0.9  ALKPHOS 40  ALT 12*  AST 14*  GLUCOSE 98  ALBUMIN 3.5   No results found for: MG  Recent Labs  09/12/16 2023 09/13/16 0510  CKTOTAL 69*  --   TROPONINI  --  <0.03    Recent Labs  09/12/16 2036  TROPIPOC 0.00   No results found for: PROBNP No results found for: CHOL, HDL, LDLCALC, TRIG Lab Results  Component Value Date   DDIMER 0.44 09/13/2016    Echo: Echocardiogram done in August 2009 at his primary care physician's office, which was read as having EF of 61%, mild left atrial enlargement, mild LVH, mild diastolic dysfunction,  mild mitral regurgitation, and aortic sclerosis.  ECG: Sinus rhythm  Radiology:  Ct Head Wo Contrast  Result Date: 09/12/2016 CLINICAL DATA:  Headache. EXAM: CT HEAD WITHOUT CONTRAST TECHNIQUE: Contiguous axial images were obtained from the base of the skull through the vertex without intravenous contrast. COMPARISON:  MRI of October 08, 2014.  CT scan of March 06, 2013. FINDINGS: Bony calvarium appears intact. Visualized paranasal sinuses appear normal. Moderate diffuse cortical atrophy is noted. No mass effect or midline shift is noted. Stable ventricular enlargement is noted most likely due to surrounding atrophy. There is no evidence of mass lesion, hemorrhage or acute infarction. IMPRESSION: Moderate diffuse cortical atrophy. No acute intracranial abnormality seen. Electronically Signed   By: Marijo Conception, M.D.   On: 09/12/2016 21:51   Dg Chest Portable 1 View  Result Date: 09/12/2016 CLINICAL DATA:  Shortness of breath. EXAM: PORTABLE CHEST 1 VIEW COMPARISON:  Radiograph of October 06, 2014. FINDINGS: The heart size and mediastinal contours are within normal limits. Both lungs are clear. No pneumothorax or pleural effusion is noted. Degenerative changes seen involving both glenohumeral joints. IMPRESSION: No acute cardiopulmonary  abnormality seen. Electronically Signed   By: Marijo Conception, M.D.   On: 09/12/2016 20:52    ASSESSMENT AND PLAN:     1. Chest pain - Difficult to get baseline. Bedbound elderly gentleman who has severe dementia. EKG nonischemic. Troponin negative. History of nonobstructive coronary artery disease on cath as described in 2009. He has ruled out. Likely his symptoms due to uncontrolled HTN. Will review with MD. Telemetry showed sinus rhythm with frequent PACs. EF of 61% on echo 07/2008.  2. Essential hypertension - Uncontrolled. Continue home Losartan 50mg . Follow closely with AKI. Add amlodipine 5mg  qd.   Otherwise per primary   COPD (chronic obstructive pulmonary disease) ([place])   Acute encephalopathy   Dementia   Signed: Kniyah Khun, PA 09/13/2016, 8:03 AM Pager [phone]  Co-Sign [id number]
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
## 2016-09-13 NOTE — H&P (Signed)
History and Physical  Patient Name: Terry Taylor     [id number]    DOB: 1928/01/18    DOA: 09/12/2016 PCP: Lorelee Market, MD   Patient coming from: Home     Chief Complaint: Chest pain  HPI: Terry Taylor is a 82 y.o. male with a past medical history significant for dementia, HTN, COPD, and CAD who presents with chest pain.  History is collected from the patient, supplemented by his son by phone, because of the patient's dementia.  Over the last 24-48 hours, the patient reports intermittent chest pain.  The chest pain is central, dull, intermittent, and associated with shortness of breath, which the family noticed. He is unable to say what exacerbates it. He is unsure if it is like any previous angina that he has had.  Family also noticed that he was "out of it" and so they checked his blood pressure which was "high", so they brought him to the ER today.  ED course: -Afebrile, heart rate 60s, respirations 14, blood pressure 145/84, pulse oximetry normal -Initial ECG showed normal sinus rhythm and troponin was negative. -Na 137, K 3.9, Cr 1.34 (baseline 1.2 in 2015), WBC 7.8, Hgb 13.7 -There is some initial concern for altered mental status, and so UDS, CT head were obtained which were normal as well as urinalysis and chest x-ray -Family made clear the presenting complaint was chest pain and so [facility] was asked to admit for observation, serial troponins and risk stratification.  The patient is been declining somewhat dramatically over the last year. He is now frequently disoriented in the morning.          Review of Systems:  All other systems negative except as just noted or noted in the history of present illness.  Past Medical History:  Diagnosis Date  . Benign prostatic hypertrophy    hx  . Chronic airway obstruction, not elsewhere classified   . Coronary atherosclerosis of unspecified type of vessel, native or graft   . Osteoarthrosis, unspecified whether  generalized or localized, unspecified site   . Pure hypercholesterolemia    IIA  . Unspecified essential hypertension     History reviewed. No pertinent surgical history.  Social History: Patient lives with his wife who is his primary caregiver and still drives.  Patient walks with a cane now.  He has dementia, progressing.  He is a former smoker.  Used to work in [employer].  From [place].    No Known Allergies  Family history: family history includes Stroke in his father and mother.  Prior to Admission medications   Medication Sig Start Date End Date Taking? Authorizing Provider  albuterol (PROVENTIL) (2.5 MG/3ML) 0.083% nebulizer solution Take 2.5 mg by nebulization every 6 (six) hours as needed for wheezing or shortness of breath.   Yes Historical Provider, MD  aspirin EC 81 MG tablet Take 81 mg by mouth daily.   Yes Historical Provider, MD  cholecalciferol (VITAMIN D) 1000 units tablet Take 1,000 Units by mouth daily.   Yes Historical Provider, MD  Fluticasone-Salmeterol (ADVAIR) 250-50 MCG/DOSE AEPB Inhale 1 puff into the lungs daily as needed.    Yes Historical Provider, MD  losartan (COZAAR) 50 MG tablet Take 1 tablet (50 mg total) by mouth daily. 03/10/13  Yes Srikar Janna Arch, MD  meloxicam (MOBIC) 7.5 MG tablet Take 7.5 mg by mouth daily.   Yes Historical Provider, MD  Multiple Vitamin (MULTIVITAMIN WITH MINERALS) TABS tablet Take 1 tablet by mouth daily.  Yes Historical Provider, MD  pravastatin (PRAVACHOL) 40 MG tablet Take 40 mg by mouth daily.   Yes Historical Provider, MD  tamsulosin (FLOMAX) 0.4 MG CAPS Take 0.4 mg by mouth daily.   Yes Historical Provider, MD  tiotropium (SPIRIVA HANDIHALER) 18 MCG inhalation capsule Place 18 mcg into inhaler and inhale daily.   Yes Historical Provider, MD  traMADol (ULTRAM) 50 MG tablet Take 50 mg by mouth every 6 (six) hours as needed for pain. For pain   Yes Historical Provider, MD       Physical Exam: BP 152/86    Pulse 62   Temp 97.7 F (36.5 C)   Resp 14   SpO2 98%  General appearance: Eldelry  male, awake and responsive and in no acute distress.   Eyes: Anicteric, conjunctiva pink, lid everted and red on left.     ENT: No nasal deformity, discharge, or epistaxis.  OP dry without lesions.   Skin: Warm and dry.   Cardiac: RRR, nl S1-S2, no murmurs appreciated.  Capillary refill is brisk.  JVP normal.  No LE edema.  Radial and DP pulses 2+ and symmetric.  No carotid bruits. Respiratory: Normal respiratory rate and rhythm.  CTAB without rales or wheezes. GI: Abdomen soft without rigidity.  No TTP. No ascites, distension.   MSK: No deformities or effusions.   Pain not reproduced with palpation of precordium.  No pain with arm movement. Neuro: Sensorium intact and responding to questions, attention normal.  Speech is fluent.  Moves all extremities equally and with normal coordination but globally weak, and barely able to lift self from bed.    Psych: Behavior appropriate.  Moderate dementia.  Oriented to place and situation, not year.  Affect normal.  No evidence of aural or visual hallucinations or delusions.       Labs on Admission:  The metabolic panel shows normal electrolytes and chronic kidney disease. The complete blood count shows no anemia, thrombocytopenia, or leukocytosis. The initial troponin is negative.  Radiological Exams on Admission: Personally reviewed: Ct Head Wo Contrast  Result Date: 09/12/2016 CLINICAL DATA:  Headache. EXAM: CT HEAD WITHOUT CONTRAST TECHNIQUE: Contiguous axial images were obtained from the base of the skull through the vertex without intravenous contrast. COMPARISON:  MRI of October 08, 2014.  CT scan of March 06, 2013. FINDINGS: Bony calvarium appears intact. Visualized paranasal sinuses appear normal. Moderate diffuse cortical atrophy is noted. No mass effect or midline shift is noted. Stable ventricular enlargement is noted most likely due to surrounding atrophy.  There is no evidence of mass lesion, hemorrhage or acute infarction. IMPRESSION: Moderate diffuse cortical atrophy. No acute intracranial abnormality seen. Electronically Signed   By: Marijo Conception, M.D.   On: 09/12/2016 21:51   Dg Chest Portable 1 View  Result Date: 09/12/2016 CLINICAL DATA:  Shortness of breath. EXAM: PORTABLE CHEST 1 VIEW COMPARISON:  Radiograph of October 06, 2014. FINDINGS: The heart size and mediastinal contours are within normal limits. Both lungs are clear. No pneumothorax or pleural effusion is noted. Degenerative changes seen involving both glenohumeral joints. IMPRESSION: No acute cardiopulmonary abnormality seen. Electronically Signed   By: Marijo Conception, M.D.   On: 09/12/2016 20:52    EKG: Independently reviewed. Rate 65, QTC 436, normal sinus rhythm.    Assessment/Plan 1. Chest pain: This is new.  The patient has HEART score of 5. Angina is possible.  Other potential causes of chest pain (dissection, pancreatitis, pneumonia/effusion, pericarditis) are doubted.  Patient unlikely  to be cardiac cath candidate, but will rule out PE with age adjusted d-dimer cutoff of 0.9, obtain echocardiogram and trend troponins -Serial troponins are ordered -Telemetry -Echocardiogram ordered -Check d-dimer   2. HTN:  -Continue losartan -Continue aspirin and statin  3. COPD:  Clinically inactive.  Currently not on inhalers.  4. Chronic arthritis:  -Continue Mobic and tramadol as needed  5. Other medications:  -Continue tamsulosin  6. CKD III:  Stable.         DVT prophylaxis: Lovenox Diet: NPO after 4am for anticipated stress testing Code Status: Full  Family Communication: Daughter at bedside and son by phone.  Differential discussed, CODE STATUS confirmed.  Disposition Plan: Anticipate overnight observation for arrhythmia on telemetry, serial troponins and subsequent  risk stratification by Cardiology.  If testing negative, home after. Consults called:  Cardiology Admission status: Telemetry, OBS   Medical decision making: Patient seen at 12:04 AM on 09/13/2016.  The patient was discussed with Dr. Christy Gentles. What exists of the patient's chart was reviewed in depth.  Clinical condition: stable.      Edwin Dada [facility] Pager [phone]

## 2016-09-14 NOTE — Telephone Encounter (Signed)
Patient contacted regarding discharge from [facility] on 09/13/16.  Patient understands to follow up with provider Mare Loan on 09/20/16 at 11:30 at [facility]. Patient understands discharge instructions? yes Patient understands medications and regiment? yes Patient understands to bring all medications to this visit? yes  Spoke w/Wife,Sylvia (with permission from pt).  She states he does have dementia and is confused this AM.  States he was very disoriented during the night and was up multiple times.  States her son and grandson had to come over and help get him back to bed. States he has problems walking and son has to literally pick him up to get him in car.  Reviewed medications with her.  Advised that she needs to call Dr. Velva Harman office today to set up appointment in 1-2 wks with him and to get lab work. She states her son was at hospital and was given all of the information.  She had been up all night with him and couldn't get to hospital. She asked that her son be contacted to verify appointment with our office.  LM on his VM of date and time of appointment and to call back with questions.  Also informed wife that she will need to fill out the HIPAA form allowing [place] to speak with her and her son or daughter. She states he has not c/o of CP since being home. Also advised will need to bring all of his medication to OV on 9/21.  She states her daughter-in-law is giving him some kind of vitamin that she doesn't approve and Dr hasn't approved.

## 2016-09-15 DIAGNOSIS — R739 Hyperglycemia, unspecified: Secondary | ICD-10-CM | POA: Diagnosis not present

## 2016-09-15 DIAGNOSIS — R609 Edema, unspecified: Secondary | ICD-10-CM | POA: Diagnosis not present

## 2016-09-15 DIAGNOSIS — I10 Essential (primary) hypertension: Secondary | ICD-10-CM | POA: Diagnosis not present

## 2016-09-15 DIAGNOSIS — E784 Other hyperlipidemia: Secondary | ICD-10-CM | POA: Diagnosis not present

## 2016-09-15 DIAGNOSIS — M129 Arthropathy, unspecified: Secondary | ICD-10-CM | POA: Diagnosis not present

## 2016-09-15 DIAGNOSIS — L89152 Pressure ulcer of sacral region, stage 2: Secondary | ICD-10-CM | POA: Diagnosis not present

## 2016-09-15 DIAGNOSIS — J449 Chronic obstructive pulmonary disease, unspecified: Secondary | ICD-10-CM | POA: Diagnosis not present

## 2016-09-15 DIAGNOSIS — F05 Delirium due to known physiological condition: Secondary | ICD-10-CM | POA: Diagnosis not present

## 2016-09-15 DIAGNOSIS — N400 Enlarged prostate without lower urinary tract symptoms: Secondary | ICD-10-CM | POA: Diagnosis not present

## 2016-09-18 DIAGNOSIS — E784 Other hyperlipidemia: Secondary | ICD-10-CM | POA: Diagnosis not present

## 2016-09-18 DIAGNOSIS — M129 Arthropathy, unspecified: Secondary | ICD-10-CM | POA: Diagnosis not present

## 2016-09-18 DIAGNOSIS — J449 Chronic obstructive pulmonary disease, unspecified: Secondary | ICD-10-CM | POA: Diagnosis not present

## 2016-09-18 DIAGNOSIS — R739 Hyperglycemia, unspecified: Secondary | ICD-10-CM | POA: Diagnosis not present

## 2016-09-18 DIAGNOSIS — L89152 Pressure ulcer of sacral region, stage 2: Secondary | ICD-10-CM | POA: Diagnosis not present

## 2016-09-18 DIAGNOSIS — I10 Essential (primary) hypertension: Secondary | ICD-10-CM | POA: Diagnosis not present

## 2016-09-18 DIAGNOSIS — N400 Enlarged prostate without lower urinary tract symptoms: Secondary | ICD-10-CM | POA: Diagnosis not present

## 2016-09-18 DIAGNOSIS — R609 Edema, unspecified: Secondary | ICD-10-CM | POA: Diagnosis not present

## 2016-09-18 DIAGNOSIS — F05 Delirium due to known physiological condition: Secondary | ICD-10-CM | POA: Diagnosis not present

## 2016-09-20 ENCOUNTER — Ambulatory Visit (INDEPENDENT_AMBULATORY_CARE_PROVIDER_SITE_OTHER): Payer: Commercial Managed Care - HMO | Admitting: Cardiology

## 2016-09-20 ENCOUNTER — Encounter: Payer: Self-pay | Admitting: Cardiology

## 2016-09-20 VITALS — BP 98/70 | HR 70 | Ht 70.0 in | Wt 164.8 lb

## 2016-09-20 DIAGNOSIS — M129 Arthropathy, unspecified: Secondary | ICD-10-CM | POA: Diagnosis not present

## 2016-09-20 DIAGNOSIS — N400 Enlarged prostate without lower urinary tract symptoms: Secondary | ICD-10-CM | POA: Diagnosis not present

## 2016-09-20 DIAGNOSIS — I2510 Atherosclerotic heart disease of native coronary artery without angina pectoris: Secondary | ICD-10-CM | POA: Diagnosis not present

## 2016-09-20 DIAGNOSIS — F05 Delirium due to known physiological condition: Secondary | ICD-10-CM | POA: Diagnosis not present

## 2016-09-20 DIAGNOSIS — R609 Edema, unspecified: Secondary | ICD-10-CM | POA: Diagnosis not present

## 2016-09-20 DIAGNOSIS — J449 Chronic obstructive pulmonary disease, unspecified: Secondary | ICD-10-CM | POA: Diagnosis not present

## 2016-09-20 DIAGNOSIS — E784 Other hyperlipidemia: Secondary | ICD-10-CM | POA: Diagnosis not present

## 2016-09-20 DIAGNOSIS — L89152 Pressure ulcer of sacral region, stage 2: Secondary | ICD-10-CM | POA: Diagnosis not present

## 2016-09-20 DIAGNOSIS — R739 Hyperglycemia, unspecified: Secondary | ICD-10-CM | POA: Diagnosis not present

## 2016-09-20 DIAGNOSIS — I10 Essential (primary) hypertension: Secondary | ICD-10-CM | POA: Diagnosis not present

## 2016-09-20 NOTE — Progress Notes (Signed)
09/20/2016 Terry Taylor   1928-07-02  [id number]  Primary Physician Lorelee Market, MD Primary Cardiologist: Dr. Radford Pax    Reason for Visit/CC: [facility] F/u for CP   HPI:  Terry Taylor is a 82 y.o. male with a history of Nonobstructive CAD, hypertension, hyperlipidemia, COPD, BPH and severe osteoporosis with osteoarthritis who presents to clinic today for post hospital follow-up, after presentation for chest pain.  He has significant dementia and is essentially bedbound. He only walks with a walker from his bed to his recliner and back.  He had an episode of chest pain 09/12/16, that radiated to his right shoulder with mild associated dyspnea.  He was admitted by internal medicine. EKG was nonischemic and cardiac enzymes were negative 3. D-dimer was also negative. He denied any further chest discomfort. He was evaluated by Dr. Radford Pax. She ordered a 2-D echocardiogram which revealed normal LV function. Given his history of nonobstructive ASCAD by cath remotely and given his debilitated state, it was recommend that he not undergo extensive invasive cardiac workup. Dr. Radford Pax recommended management of blood pressure. It was recommended that he continue aspirin, amlodipine and statin. Dr. Radford Pax also added low-dose nitrate, Imdur 15 mg daily.  He presents to clinic today for follow-up. He is accompanied by his son. He reports that he has done well since discharge. He denies any recurrent chest pain. He has been tolerating his medications well without any unusual side effects. His blood pressure is 98/60 today. He reports that he took all of his medications without eating breakfast. He has a home health nurse that comes out twice a week to check on him.   Current Meds  Medication Sig  . albuterol (PROVENTIL) (2.5 MG/3ML) 0.083% nebulizer solution Take 2.5 mg by nebulization every 6 (six) hours as needed for wheezing or shortness of breath.  Marland Kitchen amLODipine (NORVASC) 10 MG tablet Take  0.5 tablets (5 mg total) by mouth daily.  Marland Kitchen aspirin EC 81 MG tablet Take 81 mg by mouth daily.  . cholecalciferol (VITAMIN D) 1000 units tablet Take 1,000 Units by mouth daily.  . finasteride (PROSCAR) 5 MG tablet Take 1 tablet (5 mg total) by mouth daily.  . Fluticasone-Salmeterol (ADVAIR) 250-50 MCG/DOSE AEPB Inhale 1 puff into the lungs daily as needed.   . isosorbide mononitrate (IMDUR) 30 MG 24 hr tablet Take 0.5 tablets (15 mg total) by mouth daily.  Marland Kitchen losartan (COZAAR) 50 MG tablet Take 1 tablet (50 mg total) by mouth daily.  . meloxicam (MOBIC) 7.5 MG tablet Take 7.5 mg by mouth daily.  . Multiple Vitamin (MULTIVITAMIN WITH MINERALS) TABS tablet Take 1 tablet by mouth daily.  . pravastatin (PRAVACHOL) 40 MG tablet Take 40 mg by mouth daily.  . tamsulosin (FLOMAX) 0.4 MG CAPS Take 0.4 mg by mouth daily.  Marland Kitchen tiotropium (SPIRIVA HANDIHALER) 18 MCG inhalation capsule Place 18 mcg into inhaler and inhale daily.   No Known Allergies Past Medical History:  Diagnosis Date  . Benign prostatic hypertrophy    hx  . Chronic airway obstruction, not elsewhere classified   . Coronary atherosclerosis of unspecified type of vessel, native or graft   . Osteoarthrosis, unspecified whether generalized or localized, unspecified site   . Pure hypercholesterolemia    IIA  . Unspecified essential hypertension    Family History  Problem Relation Age of Onset  . Coronary artery disease      family hx  . Stroke Mother   . Stroke Father  History reviewed. No pertinent surgical history. Social History   Social History  . Marital status: Married    Spouse name: N/A  . Number of children: N/A  . Years of education: N/A   Occupational History  . Not on file.   Social History Main Topics  . Smoking status: Former Smoker    Years: 40.00    Types: Cigarettes  . Smokeless tobacco: Never Used     Comment: quit in 1979  . Alcohol use Yes     Comment: 1/day  . Drug use: No  . Sexual activity:  No   Other Topics Concern  . Not on file   Social History Narrative   Retired, married.      Review of Systems: General: negative for chills, fever, night sweats or weight changes.  Cardiovascular: negative for chest pain, dyspnea on exertion, edema, orthopnea, palpitations, paroxysmal nocturnal dyspnea or shortness of breath Dermatological: negative for rash Respiratory: negative for cough or wheezing Urologic: negative for hematuria Abdominal: negative for nausea, vomiting, diarrhea, bright red blood per rectum, melena, or hematemesis Neurologic: negative for visual changes, syncope, or dizziness All other systems reviewed and are otherwise negative except as noted above.   Physical Exam:  Blood pressure 98/70, pulse 70, height 5\' 10"  (1.778 m), weight 164 lb 12.8 oz (74.8 kg).  General appearance: alert, cooperative and no distress, elderly in wheelchair Neck: no carotid bruit and no JVD Lungs: clear to auscultation bilaterally Heart: regular rate and rhythm, S1, S2 normal, no murmur, click, rub or gallop Extremities: no LEE Pulses: 2+ and symmetric Skin: warm and dry Neurologic: Grossly normal  EKG not performed.   ASSESSMENT AND PLAN:   1. Chest Pain/ CAD: no recurrent CP. H/o  ASCAD by cath remotely. He ruled out for MI during most recent admission. Negative enzymes x 3 + nonischemic EKG. He has done well with Imdur, w/o recurrent CP and no side effects. cotninue Imdur, ASA, and statin.   2. HTN: controlled and stable. Low normal. No symptoms. Patient advised to take meds with food.   3. HLD: continue statin therapy with pravastatin.    PLAN  F/u with Dr. Radford Pax in 6 months   Trinidy Masterson Ladoris Gene 09/20/2016 11:51 AM

## 2016-09-20 NOTE — Patient Instructions (Signed)
Medication Instructions:  Your physician recommends that you continue on your current medications as directed. Please refer to the Current Medication list given to you today.   Labwork: NONE ORDERED  Testing/Procedures: NONE ORDERED  Follow-Up: Your physician wants you to follow-up in: 6 MONTHS WITH DR. TURNER.  You will receive a reminder letter in the mail two months in advance. If you don't receive a letter, please call our office to schedule the follow-up appointment.   Any Other Special Instructions Will Be Listed Below (If Applicable).     If you need a refill on your cardiac medications before your next appointment, please call your pharmacy.   

## 2016-09-21 DIAGNOSIS — E784 Other hyperlipidemia: Secondary | ICD-10-CM | POA: Diagnosis not present

## 2016-09-21 DIAGNOSIS — I10 Essential (primary) hypertension: Secondary | ICD-10-CM | POA: Diagnosis not present

## 2016-09-21 DIAGNOSIS — F05 Delirium due to known physiological condition: Secondary | ICD-10-CM | POA: Diagnosis not present

## 2016-09-21 DIAGNOSIS — J449 Chronic obstructive pulmonary disease, unspecified: Secondary | ICD-10-CM | POA: Diagnosis not present

## 2016-09-21 DIAGNOSIS — R739 Hyperglycemia, unspecified: Secondary | ICD-10-CM | POA: Diagnosis not present

## 2016-09-21 DIAGNOSIS — L89152 Pressure ulcer of sacral region, stage 2: Secondary | ICD-10-CM | POA: Diagnosis not present

## 2016-09-21 DIAGNOSIS — R609 Edema, unspecified: Secondary | ICD-10-CM | POA: Diagnosis not present

## 2016-09-21 DIAGNOSIS — M129 Arthropathy, unspecified: Secondary | ICD-10-CM | POA: Diagnosis not present

## 2016-09-21 DIAGNOSIS — N400 Enlarged prostate without lower urinary tract symptoms: Secondary | ICD-10-CM | POA: Diagnosis not present

## 2016-09-24 DIAGNOSIS — L89152 Pressure ulcer of sacral region, stage 2: Secondary | ICD-10-CM | POA: Diagnosis not present

## 2016-09-24 DIAGNOSIS — E784 Other hyperlipidemia: Secondary | ICD-10-CM | POA: Diagnosis not present

## 2016-09-24 DIAGNOSIS — I10 Essential (primary) hypertension: Secondary | ICD-10-CM | POA: Diagnosis not present

## 2016-09-24 DIAGNOSIS — R739 Hyperglycemia, unspecified: Secondary | ICD-10-CM | POA: Diagnosis not present

## 2016-09-24 DIAGNOSIS — N400 Enlarged prostate without lower urinary tract symptoms: Secondary | ICD-10-CM | POA: Diagnosis not present

## 2016-09-24 DIAGNOSIS — R609 Edema, unspecified: Secondary | ICD-10-CM | POA: Diagnosis not present

## 2016-09-24 DIAGNOSIS — J449 Chronic obstructive pulmonary disease, unspecified: Secondary | ICD-10-CM | POA: Diagnosis not present

## 2016-09-24 DIAGNOSIS — F05 Delirium due to known physiological condition: Secondary | ICD-10-CM | POA: Diagnosis not present

## 2016-09-24 DIAGNOSIS — M129 Arthropathy, unspecified: Secondary | ICD-10-CM | POA: Diagnosis not present

## 2016-09-25 DIAGNOSIS — N400 Enlarged prostate without lower urinary tract symptoms: Secondary | ICD-10-CM | POA: Diagnosis not present

## 2016-09-25 DIAGNOSIS — J449 Chronic obstructive pulmonary disease, unspecified: Secondary | ICD-10-CM | POA: Diagnosis not present

## 2016-09-25 DIAGNOSIS — E784 Other hyperlipidemia: Secondary | ICD-10-CM | POA: Diagnosis not present

## 2016-09-25 DIAGNOSIS — F05 Delirium due to known physiological condition: Secondary | ICD-10-CM | POA: Diagnosis not present

## 2016-09-25 DIAGNOSIS — I10 Essential (primary) hypertension: Secondary | ICD-10-CM | POA: Diagnosis not present

## 2016-09-25 DIAGNOSIS — R609 Edema, unspecified: Secondary | ICD-10-CM | POA: Diagnosis not present

## 2016-09-25 DIAGNOSIS — M129 Arthropathy, unspecified: Secondary | ICD-10-CM | POA: Diagnosis not present

## 2016-09-25 DIAGNOSIS — R739 Hyperglycemia, unspecified: Secondary | ICD-10-CM | POA: Diagnosis not present

## 2016-09-25 DIAGNOSIS — L89152 Pressure ulcer of sacral region, stage 2: Secondary | ICD-10-CM | POA: Diagnosis not present

## 2016-09-26 DIAGNOSIS — F0390 Unspecified dementia without behavioral disturbance: Secondary | ICD-10-CM | POA: Diagnosis not present

## 2016-09-26 DIAGNOSIS — M79602 Pain in left arm: Secondary | ICD-10-CM | POA: Diagnosis not present

## 2016-09-26 DIAGNOSIS — T148 Other injury of unspecified body region: Secondary | ICD-10-CM | POA: Diagnosis not present

## 2016-09-26 DIAGNOSIS — R296 Repeated falls: Secondary | ICD-10-CM | POA: Diagnosis not present

## 2016-09-27 DIAGNOSIS — N400 Enlarged prostate without lower urinary tract symptoms: Secondary | ICD-10-CM | POA: Diagnosis not present

## 2016-09-27 DIAGNOSIS — M129 Arthropathy, unspecified: Secondary | ICD-10-CM | POA: Diagnosis not present

## 2016-09-27 DIAGNOSIS — F05 Delirium due to known physiological condition: Secondary | ICD-10-CM | POA: Diagnosis not present

## 2016-09-27 DIAGNOSIS — I10 Essential (primary) hypertension: Secondary | ICD-10-CM | POA: Diagnosis not present

## 2016-09-27 DIAGNOSIS — R609 Edema, unspecified: Secondary | ICD-10-CM | POA: Diagnosis not present

## 2016-09-27 DIAGNOSIS — J449 Chronic obstructive pulmonary disease, unspecified: Secondary | ICD-10-CM | POA: Diagnosis not present

## 2016-09-27 DIAGNOSIS — R739 Hyperglycemia, unspecified: Secondary | ICD-10-CM | POA: Diagnosis not present

## 2016-09-27 DIAGNOSIS — E784 Other hyperlipidemia: Secondary | ICD-10-CM | POA: Diagnosis not present

## 2016-09-27 DIAGNOSIS — L89152 Pressure ulcer of sacral region, stage 2: Secondary | ICD-10-CM | POA: Diagnosis not present

## 2016-10-02 DIAGNOSIS — F05 Delirium due to known physiological condition: Secondary | ICD-10-CM | POA: Diagnosis not present

## 2016-10-02 DIAGNOSIS — I10 Essential (primary) hypertension: Secondary | ICD-10-CM | POA: Diagnosis not present

## 2016-10-02 DIAGNOSIS — M129 Arthropathy, unspecified: Secondary | ICD-10-CM | POA: Diagnosis not present

## 2016-10-02 DIAGNOSIS — E784 Other hyperlipidemia: Secondary | ICD-10-CM | POA: Diagnosis not present

## 2016-10-02 DIAGNOSIS — N400 Enlarged prostate without lower urinary tract symptoms: Secondary | ICD-10-CM | POA: Diagnosis not present

## 2016-10-02 DIAGNOSIS — R739 Hyperglycemia, unspecified: Secondary | ICD-10-CM | POA: Diagnosis not present

## 2016-10-02 DIAGNOSIS — L89152 Pressure ulcer of sacral region, stage 2: Secondary | ICD-10-CM | POA: Diagnosis not present

## 2016-10-02 DIAGNOSIS — J449 Chronic obstructive pulmonary disease, unspecified: Secondary | ICD-10-CM | POA: Diagnosis not present

## 2016-10-02 DIAGNOSIS — R609 Edema, unspecified: Secondary | ICD-10-CM | POA: Diagnosis not present

## 2016-10-04 DIAGNOSIS — F05 Delirium due to known physiological condition: Secondary | ICD-10-CM | POA: Diagnosis not present

## 2016-10-04 DIAGNOSIS — E784 Other hyperlipidemia: Secondary | ICD-10-CM | POA: Diagnosis not present

## 2016-10-04 DIAGNOSIS — R739 Hyperglycemia, unspecified: Secondary | ICD-10-CM | POA: Diagnosis not present

## 2016-10-04 DIAGNOSIS — L89152 Pressure ulcer of sacral region, stage 2: Secondary | ICD-10-CM | POA: Diagnosis not present

## 2016-10-04 DIAGNOSIS — M129 Arthropathy, unspecified: Secondary | ICD-10-CM | POA: Diagnosis not present

## 2016-10-04 DIAGNOSIS — N400 Enlarged prostate without lower urinary tract symptoms: Secondary | ICD-10-CM | POA: Diagnosis not present

## 2016-10-04 DIAGNOSIS — J449 Chronic obstructive pulmonary disease, unspecified: Secondary | ICD-10-CM | POA: Diagnosis not present

## 2016-10-04 DIAGNOSIS — R609 Edema, unspecified: Secondary | ICD-10-CM | POA: Diagnosis not present

## 2016-10-04 DIAGNOSIS — I10 Essential (primary) hypertension: Secondary | ICD-10-CM | POA: Diagnosis not present

## 2016-10-09 DIAGNOSIS — F05 Delirium due to known physiological condition: Secondary | ICD-10-CM | POA: Diagnosis not present

## 2016-10-09 DIAGNOSIS — L89152 Pressure ulcer of sacral region, stage 2: Secondary | ICD-10-CM | POA: Diagnosis not present

## 2016-10-09 DIAGNOSIS — R739 Hyperglycemia, unspecified: Secondary | ICD-10-CM | POA: Diagnosis not present

## 2016-10-09 DIAGNOSIS — N400 Enlarged prostate without lower urinary tract symptoms: Secondary | ICD-10-CM | POA: Diagnosis not present

## 2016-10-09 DIAGNOSIS — E784 Other hyperlipidemia: Secondary | ICD-10-CM | POA: Diagnosis not present

## 2016-10-09 DIAGNOSIS — J449 Chronic obstructive pulmonary disease, unspecified: Secondary | ICD-10-CM | POA: Diagnosis not present

## 2016-10-09 DIAGNOSIS — I10 Essential (primary) hypertension: Secondary | ICD-10-CM | POA: Diagnosis not present

## 2016-10-09 DIAGNOSIS — R609 Edema, unspecified: Secondary | ICD-10-CM | POA: Diagnosis not present

## 2016-10-09 DIAGNOSIS — M129 Arthropathy, unspecified: Secondary | ICD-10-CM | POA: Diagnosis not present

## 2016-10-11 DIAGNOSIS — J449 Chronic obstructive pulmonary disease, unspecified: Secondary | ICD-10-CM | POA: Diagnosis not present

## 2016-10-11 DIAGNOSIS — R609 Edema, unspecified: Secondary | ICD-10-CM | POA: Diagnosis not present

## 2016-10-11 DIAGNOSIS — I10 Essential (primary) hypertension: Secondary | ICD-10-CM | POA: Diagnosis not present

## 2016-10-11 DIAGNOSIS — L89152 Pressure ulcer of sacral region, stage 2: Secondary | ICD-10-CM | POA: Diagnosis not present

## 2016-10-11 DIAGNOSIS — M129 Arthropathy, unspecified: Secondary | ICD-10-CM | POA: Diagnosis not present

## 2016-10-11 DIAGNOSIS — F05 Delirium due to known physiological condition: Secondary | ICD-10-CM | POA: Diagnosis not present

## 2016-10-11 DIAGNOSIS — R739 Hyperglycemia, unspecified: Secondary | ICD-10-CM | POA: Diagnosis not present

## 2016-10-11 DIAGNOSIS — E784 Other hyperlipidemia: Secondary | ICD-10-CM | POA: Diagnosis not present

## 2016-10-11 DIAGNOSIS — N400 Enlarged prostate without lower urinary tract symptoms: Secondary | ICD-10-CM | POA: Diagnosis not present

## 2016-10-16 DIAGNOSIS — N400 Enlarged prostate without lower urinary tract symptoms: Secondary | ICD-10-CM | POA: Diagnosis not present

## 2016-10-16 DIAGNOSIS — M129 Arthropathy, unspecified: Secondary | ICD-10-CM | POA: Diagnosis not present

## 2016-10-16 DIAGNOSIS — R609 Edema, unspecified: Secondary | ICD-10-CM | POA: Diagnosis not present

## 2016-10-16 DIAGNOSIS — J449 Chronic obstructive pulmonary disease, unspecified: Secondary | ICD-10-CM | POA: Diagnosis not present

## 2016-10-16 DIAGNOSIS — E784 Other hyperlipidemia: Secondary | ICD-10-CM | POA: Diagnosis not present

## 2016-10-16 DIAGNOSIS — F05 Delirium due to known physiological condition: Secondary | ICD-10-CM | POA: Diagnosis not present

## 2016-10-16 DIAGNOSIS — I10 Essential (primary) hypertension: Secondary | ICD-10-CM | POA: Diagnosis not present

## 2016-10-16 DIAGNOSIS — R739 Hyperglycemia, unspecified: Secondary | ICD-10-CM | POA: Diagnosis not present

## 2016-10-16 DIAGNOSIS — L89152 Pressure ulcer of sacral region, stage 2: Secondary | ICD-10-CM | POA: Diagnosis not present

## 2016-10-23 DIAGNOSIS — F05 Delirium due to known physiological condition: Secondary | ICD-10-CM | POA: Diagnosis not present

## 2016-10-23 DIAGNOSIS — L89152 Pressure ulcer of sacral region, stage 2: Secondary | ICD-10-CM | POA: Diagnosis not present

## 2016-10-23 DIAGNOSIS — I10 Essential (primary) hypertension: Secondary | ICD-10-CM | POA: Diagnosis not present

## 2016-10-23 DIAGNOSIS — E784 Other hyperlipidemia: Secondary | ICD-10-CM | POA: Diagnosis not present

## 2016-10-23 DIAGNOSIS — R609 Edema, unspecified: Secondary | ICD-10-CM | POA: Diagnosis not present

## 2016-10-23 DIAGNOSIS — R739 Hyperglycemia, unspecified: Secondary | ICD-10-CM | POA: Diagnosis not present

## 2016-10-23 DIAGNOSIS — N400 Enlarged prostate without lower urinary tract symptoms: Secondary | ICD-10-CM | POA: Diagnosis not present

## 2016-10-23 DIAGNOSIS — M129 Arthropathy, unspecified: Secondary | ICD-10-CM | POA: Diagnosis not present

## 2016-10-23 DIAGNOSIS — J449 Chronic obstructive pulmonary disease, unspecified: Secondary | ICD-10-CM | POA: Diagnosis not present

## 2016-10-25 DIAGNOSIS — F05 Delirium due to known physiological condition: Secondary | ICD-10-CM | POA: Diagnosis not present

## 2016-10-25 DIAGNOSIS — E784 Other hyperlipidemia: Secondary | ICD-10-CM | POA: Diagnosis not present

## 2016-10-25 DIAGNOSIS — J449 Chronic obstructive pulmonary disease, unspecified: Secondary | ICD-10-CM | POA: Diagnosis not present

## 2016-10-25 DIAGNOSIS — M129 Arthropathy, unspecified: Secondary | ICD-10-CM | POA: Diagnosis not present

## 2016-10-25 DIAGNOSIS — R609 Edema, unspecified: Secondary | ICD-10-CM | POA: Diagnosis not present

## 2016-10-25 DIAGNOSIS — N400 Enlarged prostate without lower urinary tract symptoms: Secondary | ICD-10-CM | POA: Diagnosis not present

## 2016-10-25 DIAGNOSIS — R739 Hyperglycemia, unspecified: Secondary | ICD-10-CM | POA: Diagnosis not present

## 2016-10-25 DIAGNOSIS — L89152 Pressure ulcer of sacral region, stage 2: Secondary | ICD-10-CM | POA: Diagnosis not present

## 2016-10-25 DIAGNOSIS — I10 Essential (primary) hypertension: Secondary | ICD-10-CM | POA: Diagnosis not present

## 2016-10-30 DIAGNOSIS — I10 Essential (primary) hypertension: Secondary | ICD-10-CM | POA: Diagnosis not present

## 2016-10-30 DIAGNOSIS — E784 Other hyperlipidemia: Secondary | ICD-10-CM | POA: Diagnosis not present

## 2016-10-30 DIAGNOSIS — R739 Hyperglycemia, unspecified: Secondary | ICD-10-CM | POA: Diagnosis not present

## 2016-10-30 DIAGNOSIS — M129 Arthropathy, unspecified: Secondary | ICD-10-CM | POA: Diagnosis not present

## 2016-10-30 DIAGNOSIS — L89152 Pressure ulcer of sacral region, stage 2: Secondary | ICD-10-CM | POA: Diagnosis not present

## 2016-10-30 DIAGNOSIS — N400 Enlarged prostate without lower urinary tract symptoms: Secondary | ICD-10-CM | POA: Diagnosis not present

## 2016-10-30 DIAGNOSIS — F05 Delirium due to known physiological condition: Secondary | ICD-10-CM | POA: Diagnosis not present

## 2016-10-30 DIAGNOSIS — R609 Edema, unspecified: Secondary | ICD-10-CM | POA: Diagnosis not present

## 2016-10-30 DIAGNOSIS — J449 Chronic obstructive pulmonary disease, unspecified: Secondary | ICD-10-CM | POA: Diagnosis not present

## 2016-11-01 DIAGNOSIS — J449 Chronic obstructive pulmonary disease, unspecified: Secondary | ICD-10-CM | POA: Diagnosis not present

## 2016-11-01 DIAGNOSIS — F419 Anxiety disorder, unspecified: Secondary | ICD-10-CM | POA: Diagnosis not present

## 2016-11-01 DIAGNOSIS — Z9181 History of falling: Secondary | ICD-10-CM | POA: Diagnosis not present

## 2016-11-01 DIAGNOSIS — R531 Weakness: Secondary | ICD-10-CM | POA: Diagnosis not present

## 2016-11-01 DIAGNOSIS — R739 Hyperglycemia, unspecified: Secondary | ICD-10-CM | POA: Diagnosis not present

## 2016-11-01 DIAGNOSIS — N400 Enlarged prostate without lower urinary tract symptoms: Secondary | ICD-10-CM | POA: Diagnosis not present

## 2016-11-01 DIAGNOSIS — I10 Essential (primary) hypertension: Secondary | ICD-10-CM | POA: Diagnosis not present

## 2016-11-01 DIAGNOSIS — E784 Other hyperlipidemia: Secondary | ICD-10-CM | POA: Diagnosis not present

## 2016-11-01 DIAGNOSIS — M129 Arthropathy, unspecified: Secondary | ICD-10-CM | POA: Diagnosis not present

## 2016-11-06 DIAGNOSIS — I10 Essential (primary) hypertension: Secondary | ICD-10-CM | POA: Diagnosis not present

## 2016-11-06 DIAGNOSIS — J449 Chronic obstructive pulmonary disease, unspecified: Secondary | ICD-10-CM | POA: Diagnosis not present

## 2016-11-06 DIAGNOSIS — M129 Arthropathy, unspecified: Secondary | ICD-10-CM | POA: Diagnosis not present

## 2016-11-06 DIAGNOSIS — Z9181 History of falling: Secondary | ICD-10-CM | POA: Diagnosis not present

## 2016-11-06 DIAGNOSIS — F419 Anxiety disorder, unspecified: Secondary | ICD-10-CM | POA: Diagnosis not present

## 2016-11-06 DIAGNOSIS — R739 Hyperglycemia, unspecified: Secondary | ICD-10-CM | POA: Diagnosis not present

## 2016-11-06 DIAGNOSIS — R531 Weakness: Secondary | ICD-10-CM | POA: Diagnosis not present

## 2016-11-06 DIAGNOSIS — E784 Other hyperlipidemia: Secondary | ICD-10-CM | POA: Diagnosis not present

## 2016-11-06 DIAGNOSIS — N400 Enlarged prostate without lower urinary tract symptoms: Secondary | ICD-10-CM | POA: Diagnosis not present

## 2016-11-15 DIAGNOSIS — J449 Chronic obstructive pulmonary disease, unspecified: Secondary | ICD-10-CM | POA: Diagnosis not present

## 2016-11-15 DIAGNOSIS — F419 Anxiety disorder, unspecified: Secondary | ICD-10-CM | POA: Diagnosis not present

## 2016-11-15 DIAGNOSIS — R531 Weakness: Secondary | ICD-10-CM | POA: Diagnosis not present

## 2016-11-15 DIAGNOSIS — Z9181 History of falling: Secondary | ICD-10-CM | POA: Diagnosis not present

## 2016-11-15 DIAGNOSIS — E784 Other hyperlipidemia: Secondary | ICD-10-CM | POA: Diagnosis not present

## 2016-11-15 DIAGNOSIS — N400 Enlarged prostate without lower urinary tract symptoms: Secondary | ICD-10-CM | POA: Diagnosis not present

## 2016-11-15 DIAGNOSIS — I10 Essential (primary) hypertension: Secondary | ICD-10-CM | POA: Diagnosis not present

## 2016-11-15 DIAGNOSIS — R739 Hyperglycemia, unspecified: Secondary | ICD-10-CM | POA: Diagnosis not present

## 2016-11-15 DIAGNOSIS — M129 Arthropathy, unspecified: Secondary | ICD-10-CM | POA: Diagnosis not present

## 2016-11-16 DIAGNOSIS — J449 Chronic obstructive pulmonary disease, unspecified: Secondary | ICD-10-CM | POA: Diagnosis not present

## 2016-11-16 DIAGNOSIS — F0390 Unspecified dementia without behavioral disturbance: Secondary | ICD-10-CM | POA: Diagnosis not present

## 2016-11-16 DIAGNOSIS — F05 Delirium due to known physiological condition: Secondary | ICD-10-CM | POA: Diagnosis not present

## 2016-11-16 DIAGNOSIS — R296 Repeated falls: Secondary | ICD-10-CM | POA: Diagnosis not present

## 2016-11-16 DIAGNOSIS — R2681 Unsteadiness on feet: Secondary | ICD-10-CM | POA: Diagnosis not present

## 2016-11-16 DIAGNOSIS — M129 Arthropathy, unspecified: Secondary | ICD-10-CM | POA: Diagnosis not present

## 2016-11-19 DIAGNOSIS — E784 Other hyperlipidemia: Secondary | ICD-10-CM | POA: Diagnosis not present

## 2016-11-19 DIAGNOSIS — Z9181 History of falling: Secondary | ICD-10-CM | POA: Diagnosis not present

## 2016-11-19 DIAGNOSIS — I10 Essential (primary) hypertension: Secondary | ICD-10-CM | POA: Diagnosis not present

## 2016-11-19 DIAGNOSIS — M129 Arthropathy, unspecified: Secondary | ICD-10-CM | POA: Diagnosis not present

## 2016-11-19 DIAGNOSIS — R531 Weakness: Secondary | ICD-10-CM | POA: Diagnosis not present

## 2016-11-19 DIAGNOSIS — J449 Chronic obstructive pulmonary disease, unspecified: Secondary | ICD-10-CM | POA: Diagnosis not present

## 2016-11-19 DIAGNOSIS — F419 Anxiety disorder, unspecified: Secondary | ICD-10-CM | POA: Diagnosis not present

## 2016-11-19 DIAGNOSIS — N400 Enlarged prostate without lower urinary tract symptoms: Secondary | ICD-10-CM | POA: Diagnosis not present

## 2016-11-19 DIAGNOSIS — R739 Hyperglycemia, unspecified: Secondary | ICD-10-CM | POA: Diagnosis not present

## 2016-11-20 DIAGNOSIS — E860 Dehydration: Secondary | ICD-10-CM | POA: Diagnosis not present

## 2016-11-20 DIAGNOSIS — M25569 Pain in unspecified knee: Secondary | ICD-10-CM | POA: Diagnosis not present

## 2016-11-20 DIAGNOSIS — E559 Vitamin D deficiency, unspecified: Secondary | ICD-10-CM | POA: Diagnosis not present

## 2016-11-20 DIAGNOSIS — R5383 Other fatigue: Secondary | ICD-10-CM | POA: Diagnosis not present

## 2016-11-20 DIAGNOSIS — E119 Type 2 diabetes mellitus without complications: Secondary | ICD-10-CM | POA: Diagnosis not present

## 2016-11-20 DIAGNOSIS — E7800 Pure hypercholesterolemia, unspecified: Secondary | ICD-10-CM | POA: Diagnosis not present

## 2016-11-27 DIAGNOSIS — M129 Arthropathy, unspecified: Secondary | ICD-10-CM | POA: Diagnosis not present

## 2016-11-27 DIAGNOSIS — I10 Essential (primary) hypertension: Secondary | ICD-10-CM | POA: Diagnosis not present

## 2016-11-27 DIAGNOSIS — Z9181 History of falling: Secondary | ICD-10-CM | POA: Diagnosis not present

## 2016-11-27 DIAGNOSIS — N400 Enlarged prostate without lower urinary tract symptoms: Secondary | ICD-10-CM | POA: Diagnosis not present

## 2016-11-27 DIAGNOSIS — J449 Chronic obstructive pulmonary disease, unspecified: Secondary | ICD-10-CM | POA: Diagnosis not present

## 2016-11-27 DIAGNOSIS — R531 Weakness: Secondary | ICD-10-CM | POA: Diagnosis not present

## 2016-11-27 DIAGNOSIS — R739 Hyperglycemia, unspecified: Secondary | ICD-10-CM | POA: Diagnosis not present

## 2016-11-27 DIAGNOSIS — E784 Other hyperlipidemia: Secondary | ICD-10-CM | POA: Diagnosis not present

## 2016-11-27 DIAGNOSIS — F419 Anxiety disorder, unspecified: Secondary | ICD-10-CM | POA: Diagnosis not present

## 2016-12-16 DIAGNOSIS — R2681 Unsteadiness on feet: Secondary | ICD-10-CM | POA: Diagnosis not present

## 2016-12-16 DIAGNOSIS — F0390 Unspecified dementia without behavioral disturbance: Secondary | ICD-10-CM | POA: Diagnosis not present

## 2016-12-16 DIAGNOSIS — J449 Chronic obstructive pulmonary disease, unspecified: Secondary | ICD-10-CM | POA: Diagnosis not present

## 2016-12-16 DIAGNOSIS — M129 Arthropathy, unspecified: Secondary | ICD-10-CM | POA: Diagnosis not present

## 2016-12-16 DIAGNOSIS — F05 Delirium due to known physiological condition: Secondary | ICD-10-CM | POA: Diagnosis not present

## 2016-12-16 DIAGNOSIS — R296 Repeated falls: Secondary | ICD-10-CM | POA: Diagnosis not present

## 2017-01-08 ENCOUNTER — Emergency Department (HOSPITAL_COMMUNITY): Payer: Medicare HMO

## 2017-01-08 ENCOUNTER — Encounter (HOSPITAL_COMMUNITY): Payer: Self-pay | Admitting: Emergency Medicine

## 2017-01-08 ENCOUNTER — Inpatient Hospital Stay (HOSPITAL_COMMUNITY)
Admission: EM | Admit: 2017-01-08 | Discharge: 2017-01-11 | DRG: 194 | Disposition: A | Discharge status: Home Health | Payer: Medicare HMO | Attending: Internal Medicine | Admitting: Internal Medicine

## 2017-01-08 DIAGNOSIS — Z8673 Personal history of transient ischemic attack (TIA), and cerebral infarction without residual deficits: Secondary | ICD-10-CM

## 2017-01-08 DIAGNOSIS — Z79899 Other long term (current) drug therapy: Secondary | ICD-10-CM | POA: Diagnosis not present

## 2017-01-08 DIAGNOSIS — Z87891 Personal history of nicotine dependence: Secondary | ICD-10-CM

## 2017-01-08 DIAGNOSIS — Z7982 Long term (current) use of aspirin: Secondary | ICD-10-CM | POA: Diagnosis not present

## 2017-01-08 DIAGNOSIS — Z791 Long term (current) use of non-steroidal anti-inflammatories (NSAID): Secondary | ICD-10-CM | POA: Diagnosis not present

## 2017-01-08 DIAGNOSIS — N390 Urinary tract infection, site not specified: Secondary | ICD-10-CM | POA: Diagnosis not present

## 2017-01-08 DIAGNOSIS — Z8249 Family history of ischemic heart disease and other diseases of the circulatory system: Secondary | ICD-10-CM

## 2017-01-08 DIAGNOSIS — Z87440 Personal history of urinary (tract) infections: Secondary | ICD-10-CM | POA: Diagnosis not present

## 2017-01-08 DIAGNOSIS — M6281 Muscle weakness (generalized): Secondary | ICD-10-CM

## 2017-01-08 DIAGNOSIS — J101 Influenza due to other identified influenza virus with other respiratory manifestations: Secondary | ICD-10-CM | POA: Diagnosis not present

## 2017-01-08 DIAGNOSIS — N400 Enlarged prostate without lower urinary tract symptoms: Secondary | ICD-10-CM | POA: Diagnosis present

## 2017-01-08 DIAGNOSIS — E785 Hyperlipidemia, unspecified: Secondary | ICD-10-CM | POA: Diagnosis present

## 2017-01-08 DIAGNOSIS — F05 Delirium due to known physiological condition: Secondary | ICD-10-CM | POA: Diagnosis present

## 2017-01-08 DIAGNOSIS — I10 Essential (primary) hypertension: Secondary | ICD-10-CM | POA: Diagnosis present

## 2017-01-08 DIAGNOSIS — I2510 Atherosclerotic heart disease of native coronary artery without angina pectoris: Secondary | ICD-10-CM | POA: Diagnosis present

## 2017-01-08 DIAGNOSIS — Z7952 Long term (current) use of systemic steroids: Secondary | ICD-10-CM

## 2017-01-08 DIAGNOSIS — I2583 Coronary atherosclerosis due to lipid rich plaque: Secondary | ICD-10-CM | POA: Diagnosis present

## 2017-01-08 DIAGNOSIS — Z823 Family history of stroke: Secondary | ICD-10-CM

## 2017-01-08 DIAGNOSIS — E7800 Pure hypercholesterolemia, unspecified: Secondary | ICD-10-CM | POA: Diagnosis present

## 2017-01-08 DIAGNOSIS — R0602 Shortness of breath: Secondary | ICD-10-CM | POA: Diagnosis not present

## 2017-01-08 DIAGNOSIS — I480 Paroxysmal atrial fibrillation: Secondary | ICD-10-CM | POA: Diagnosis not present

## 2017-01-08 DIAGNOSIS — J441 Chronic obstructive pulmonary disease with (acute) exacerbation: Secondary | ICD-10-CM | POA: Diagnosis not present

## 2017-01-08 DIAGNOSIS — I4891 Unspecified atrial fibrillation: Secondary | ICD-10-CM | POA: Diagnosis not present

## 2017-01-08 DIAGNOSIS — F0390 Unspecified dementia without behavioral disturbance: Secondary | ICD-10-CM | POA: Diagnosis present

## 2017-01-08 DIAGNOSIS — Z91030 Bee allergy status: Secondary | ICD-10-CM

## 2017-01-08 DIAGNOSIS — R509 Fever, unspecified: Secondary | ICD-10-CM | POA: Diagnosis not present

## 2017-01-08 DIAGNOSIS — J09X2 Influenza due to identified novel influenza A virus with other respiratory manifestations: Secondary | ICD-10-CM | POA: Diagnosis not present

## 2017-01-08 DIAGNOSIS — J449 Chronic obstructive pulmonary disease, unspecified: Secondary | ICD-10-CM | POA: Diagnosis present

## 2017-01-08 HISTORY — DX: Unspecified malignant neoplasm of skin of unspecified part of face: C44.300

## 2017-01-08 HISTORY — DX: Unspecified osteoarthritis, unspecified site: M19.90

## 2017-01-08 HISTORY — DX: Unspecified atrial fibrillation: I48.91

## 2017-01-08 HISTORY — DX: Cerebral infarction, unspecified: I63.9

## 2017-01-08 HISTORY — DX: Unspecified chronic bronchitis: J42

## 2017-01-08 HISTORY — DX: Pneumonia, unspecified organism: J18.9

## 2017-01-08 HISTORY — DX: Gastro-esophageal reflux disease without esophagitis: K21.9

## 2017-01-08 HISTORY — DX: Dependence on supplemental oxygen: Z99.81

## 2017-01-08 LAB — URINALYSIS, ROUTINE W REFLEX MICROSCOPIC
Bacteria, UA: NONE SEEN
Bilirubin Urine: NEGATIVE
GLUCOSE, UA: NEGATIVE mg/dL
Ketones, ur: 5 mg/dL — AB
LEUKOCYTES UA: NEGATIVE
Nitrite: NEGATIVE
PH: 5 (ref 5.0–8.0)
Protein, ur: NEGATIVE mg/dL
SPECIFIC GRAVITY, URINE: 1.025 (ref 1.005–1.030)

## 2017-01-08 LAB — CBC WITH DIFFERENTIAL/PLATELET
BASOS ABS: 0 10*3/uL (ref 0.0–0.1)
Basophils Relative: 0 %
EOS ABS: 0 10*3/uL (ref 0.0–0.7)
EOS PCT: 0 %
HCT: 37.6 % — ABNORMAL LOW (ref 39.0–52.0)
Hemoglobin: 12.8 g/dL — ABNORMAL LOW (ref 13.0–17.0)
LYMPHS PCT: 8 %
Lymphs Abs: 0.6 10*3/uL — ABNORMAL LOW (ref 0.7–4.0)
MCH: 29.2 pg (ref 26.0–34.0)
MCHC: 34 g/dL (ref 30.0–36.0)
MCV: 85.6 fL (ref 78.0–100.0)
MONO ABS: 0.3 10*3/uL (ref 0.1–1.0)
Monocytes Relative: 4 %
Neutro Abs: 6.1 10*3/uL (ref 1.7–7.7)
Neutrophils Relative %: 88 %
PLATELETS: 145 10*3/uL — AB (ref 150–400)
RBC: 4.39 MIL/uL (ref 4.22–5.81)
RDW: 14.5 % (ref 11.5–15.5)
WBC: 7 10*3/uL (ref 4.0–10.5)

## 2017-01-08 LAB — BRAIN NATRIURETIC PEPTIDE: B Natriuretic Peptide: 159.1 pg/mL — ABNORMAL HIGH (ref 0.0–100.0)

## 2017-01-08 LAB — COMPREHENSIVE METABOLIC PANEL
ALBUMIN: 3.3 g/dL — AB (ref 3.5–5.0)
ALK PHOS: 40 U/L (ref 38–126)
ALT: 16 U/L — AB (ref 17–63)
AST: 24 U/L (ref 15–41)
Anion gap: 8 (ref 5–15)
BILIRUBIN TOTAL: 0.9 mg/dL (ref 0.3–1.2)
BUN: 16 mg/dL (ref 6–20)
CO2: 25 mmol/L (ref 22–32)
CREATININE: 1.13 mg/dL (ref 0.61–1.24)
Calcium: 8.5 mg/dL — ABNORMAL LOW (ref 8.9–10.3)
Chloride: 104 mmol/L (ref 101–111)
GFR calc Af Amer: >60 mL/min (ref >60)
GFR calc non Af Amer: 57 mL/min — ABNORMAL LOW (ref >60)
GLUCOSE: 115 mg/dL — AB (ref 65–99)
POTASSIUM: 3.9 mmol/L (ref 3.5–5.1)
Sodium: 137 mmol/L (ref 135–145)
TOTAL PROTEIN: 5.5 g/dL — AB (ref 6.5–8.1)

## 2017-01-08 LAB — I-STAT CG4 LACTIC ACID, ED: Lactic Acid, Venous: 1.07 mmol/L (ref 0.5–1.9)

## 2017-01-08 MED ORDER — IPRATROPIUM-ALBUTEROL 0.5-2.5 (3) MG/3ML IN SOLN
3.0000 mL | Freq: Once | RESPIRATORY_TRACT | Status: AC
  Administered 2017-01-08: 3 mL via RESPIRATORY_TRACT
  Filled 2017-01-08: qty 3

## 2017-01-08 MED ORDER — ACETAMINOPHEN 500 MG PO TABS
1000.0000 mg | ORAL_TABLET | Freq: Once | ORAL | Status: AC
  Administered 2017-01-08: 1000 mg via ORAL
  Filled 2017-01-08: qty 2

## 2017-01-08 MED ORDER — IPRATROPIUM-ALBUTEROL 0.5-2.5 (3) MG/3ML IN SOLN
3.0000 mL | Freq: Once | RESPIRATORY_TRACT | Status: AC
  Administered 2017-01-09: 3 mL via RESPIRATORY_TRACT
  Filled 2017-01-08: qty 3

## 2017-01-08 NOTE — ED Notes (Signed)
Lab to add on BNP.  °

## 2017-01-08 NOTE — ED Notes (Signed)
Per [facility], pt from home lives with wife, pt hx of chronic UTIs, strong odor present, pt is incontinent of urine and urinated on ems stretcher. Pt fever shows 102.4. Pt also has rhonchi in lower lobes. Ems called for cough.

## 2017-01-08 NOTE — ED Provider Notes (Signed)
Emergency Department Provider Note   I have reviewed the triage vital signs and the nursing notes.   HISTORY  Chief Complaint Recurrent UTI and Cough   HPI Terry Taylor is a 83 y.o. male with PMH of COPD, BPH, CAD, CVA, and HTN resents to the emergency department for evaluation of confusion, fever, cough, and fatigue. The patient reports his primary concern is his coughing which is been especially bad over the last 2-3 days. He has some mild dyspnea. Family states that he is unable to get out of bed and walk around. He states that he typically is able to walk throughout the house several times a day. He's had some muscle aches and family also reports dark, foul-smelling urine. The patient called his primary care physician today who prescribed antibiotic over the phone. Family cannot recall what the name of the antibiotic was but states he only took one pill. When he began having worsening coughing and seemed better the family called EMS.   Patient reports some associated mild epigastric abdominal discomfort but no vomiting or diarrhea. No exacerbating or alleviating factors.    Past Medical History:  Diagnosis Date  . Benign prostatic hypertrophy    hx  . Chronic airway obstruction, not elsewhere classified   . Coronary atherosclerosis of unspecified type of vessel, native or graft   . Osteoarthrosis, unspecified whether generalized or localized, unspecified site   . Pure hypercholesterolemia    IIA  . Stroke ([place])   . Unspecified essential hypertension     Patient Active Problem List   Diagnosis Date Noted  . Influenza A 01/09/2017  . New onset atrial fibrillation ([place]) 01/08/2017  . COPD (chronic obstructive pulmonary disease) ([place]) 01/08/2017  . Dementia 09/13/2016  . Chest pain, rule out acute myocardial infarction 09/12/2016  . Fever 03/07/2013  . Acute encephalopathy 03/07/2013  . Weakness generalized 03/07/2013  . HYPERCHOLESTEROLEMIA  IIA 04/09/2009  .  Essential hypertension 04/09/2009  . Coronary artery disease due to lipid rich plaque 04/09/2009  . COPD exacerbation ([place]) 04/09/2009  . OSTEOARTHRITIS 04/09/2009  . BENIGN PROSTATIC HYPERTROPHY, HX OF 04/09/2009    History reviewed. No pertinent surgical history.  Current Outpatient Rx  . Order #: [id number] Class: Historical Med  . Order #: [id number] Class: Historical Med  . Order #: [id number] Class: Historical Med  . Order #: [id number] Class: Historical Med  . Order #: [id number] Class: Print  . Order #: [id number] Class: Historical Med  . Order #: [id number] Class: Historical Med  . Order #: [id number] Class: Print  . Order #: [id number] Class: Print  . Order #: [id number] Class: Historical Med  . Order #: [id number] Class: Historical Med  . Order #: [id number] Class: Historical Med  . Order #: [id number] Class: Historical Med  . Order #: [id number] Class: Historical Med  . Order #: [id number] Class: Historical Med    Allergies Bee venom  Family History  Problem Relation Age of Onset  . Coronary artery disease      family hx  . Stroke Mother   . Stroke Father     Social History Social History  Substance Use Topics  . Smoking status: Former Smoker    Years: 40.00    Types: Cigarettes  . Smokeless tobacco: Never Used     Comment: quit in 1979  . Alcohol use Yes     Comment: 1/day    Review of Systems  Constitutional: Positive fever/chills. Positive confusion and AMS.  Eyes: No visual changes. ENT: No sore throat. Cardiovascular: Denies chest pain. Respiratory: Positive shortness of breath  and cough.  Gastrointestinal: No abdominal pain. No nausea, no vomiting. No diarrhea. No constipation. Genitourinary: Negative for dysuria. Positive dark, foul smelling urine.  Musculoskeletal: Negative for back pain. Skin: Negative for rash. Neurological: Negative for headaches, focal weakness or numbness.  10-point ROS otherwise  negative.  ____________________________________________   PHYSICAL EXAM:  VITAL SIGNS: ED Triage Vitals [01/08/17 1904]  Enc Vitals Group     BP 143/80     Pulse Rate 78     Resp 20     Temp 99.8 F (37.7 C)     Temp Source Oral     SpO2 96 %   Constitutional: Chronically ill appearing.  Eyes: Conjunctivae are normal with edematous lids.  Head: Atraumatic. Nose: No congestion/rhinnorhea. Mouth/Throat: Mucous membranes are moist.  Oropharynx non-erythematous. Neck: No stridor.  Cardiovascular: Normal rate, regular rhythm. Good peripheral circulation. Grossly normal heart sounds.   Respiratory: Normal respiratory effort.  No retractions. Lungs with rhonchi L>R and diffuse crackles with expiratory wheezing.  Gastrointestinal: Soft and nontender. No distention.  Musculoskeletal: No lower extremity tenderness. Positive 2+ LE edema. No gross deformities of extremities. Neurologic:  Normal speech and language. No gross focal neurologic deficits are appreciated.  Skin:  Skin is warm, dry and intact. No rash noted.  ____________________________________________   LABS (all labs ordered are listed, but only abnormal results are displayed)  Labs Reviewed  COMPREHENSIVE METABOLIC PANEL - Abnormal; Notable for the following:       Result Value   Glucose, Bld 115 (*)    Calcium 8.5 (*)    Total Protein 5.5 (*)    Albumin 3.3 (*)    ALT 16 (*)    GFR calc non Af Amer 57 (*)    All other components within normal limits  CBC WITH DIFFERENTIAL/PLATELET - Abnormal; Notable for the following:    Hemoglobin 12.8 (*)    HCT 37.6 (*)    Platelets 145 (*)    Lymphs Abs 0.6 (*)    All other components within normal limits  URINALYSIS, ROUTINE W REFLEX MICROSCOPIC - Abnormal; Notable for the following:    Hgb urine dipstick SMALL (*)    Ketones, ur 5 (*)    Squamous Epithelial / LPF 0-5 (*)    All other components within normal limits  INFLUENZA PANEL BY PCR (TYPE A & B, H1N1) -  Abnormal; Notable for the following:    Influenza A By PCR POSITIVE (*)    All other components within normal limits  BRAIN NATRIURETIC PEPTIDE - Abnormal; Notable for the following:    B Natriuretic Peptide 159.1 (*)    All other components within normal limits  PROTIME-INR - Abnormal; Notable for the following:    Prothrombin Time 15.8 (*)    All other components within normal limits  CULTURE, BLOOD (ROUTINE X 2)  CULTURE, BLOOD (ROUTINE X 2)  CULTURE, EXPECTORATED SPUTUM-ASSESSMENT  GRAM STAIN  TSH  T4, FREE  TROPONIN I  LIPID PANEL  LACTIC ACID, PLASMA  PROCALCITONIN  APTT  STREP PNEUMONIAE URINARY ANTIGEN  TROPONIN I  TROPONIN I  HEMOGLOBIN A1C  LACTIC ACID, PLASMA  I-STAT CG4 LACTIC ACID, ED   ____________________________________________  EKG   EKG Interpretation  Date/Time:  Tuesday January 08 2017 19:10:20 EST Ventricular Rate:  78 PR Interval:    QRS Duration: 89 QT Interval:  384 QTC Calculation: 438 R Axis:   56 Text Interpretation:  baseline artefact makes rhythm designation uncertain.  R-R intervals are consistent - suggesing  SR or junctional T wave abnormality Artifact Abnormal ekg Confirmed by Carmin Muskrat  MD ([id number]) on 01/09/2017 11:26:45 AM      ____________________________________________  RADIOLOGY  Dg Chest 2 View  Result Date: 01/08/2017 CLINICAL DATA:  Shortness of breath. EXAM: CHEST  2 VIEW COMPARISON:  09/12/2016 FINDINGS: The cardiac silhouette, mediastinal and hilar contours are within normal limits and stable. There is moderate tortuosity and calcification of the thoracic aorta. No definite infiltrates or edema. Streaky areas of atelectasis and mild chronic bronchitic changes. Could not exclude small effusions. The bony thorax is intact. Stable degenerative changes involving both shoulders. IMPRESSION: Chronic bronchitic changes and streaky bibasilar atelectasis. Could not exclude small effusions. No edema or infiltrates. Electronically  Signed   By: Marijo Sanes M.D.   On: 01/08/2017 20:40    ____________________________________________   PROCEDURES  Procedure(s) performed:   Procedures  None ____________________________________________   INITIAL IMPRESSION / ASSESSMENT AND PLAN / ED COURSE  Pertinent labs & imaging results that were available during my care of the patient were reviewed by me and considered in my medical decision making (see chart for details).  Patient Is to the emergency department for evaluation of confusion, fatigue, and fever. He has foul-smelling urine and cough for 2 days. He does have a history of COPD but states symptoms have been worse than normal. Patient is awake and alert but does seem mildly confused. He seems chronically ill. Lungs are significant for rhonchorous breath sounds worse on the left. No abdominal discomfort. Plan for sepsis labs including lactate. We'll obtain chest x-ray. Vital signs at this time do not meet SIRS criteria and so code sepsis was not activated with no clear source at this time. Would consider activation later if source identified or lab values change.   Patient is flu positive. Plan for admission with confusion and severe fatigue in the setting of multiple medical co-morbidities.   Discussed patient's case with hospitalist, Dr. Blaine Hamper.  Recommend admission to tele, obs bed.  I will place holding orders per their request. Patient and family (if present) updated with plan. Care transferred to hospitlaist service.  I reviewed all nursing notes, vitals, pertinent old records, EKGs, labs, imaging (as available).  ____________________________________________  FINAL CLINICAL IMPRESSION(S) / ED DIAGNOSES  Final diagnoses:  Influenza A     MEDICATIONS GIVEN DURING THIS VISIT:  Medications  ipratropium-albuterol (DUONEB) 0.5-2.5 (3) MG/3ML nebulizer solution 3 mL (3 mLs Nebulization Given 01/09/17 1225)  acetaminophen (TYLENOL) tablet 500 mg (500 mg Oral Given  01/09/17 0305)  hydroxypropyl methylcellulose / hypromellose (ISOPTO TEARS / GONIOVISC) 2.5 % ophthalmic solution 1 drop (not administered)  omega-3 acid ethyl esters (LOVAZA) capsule 1 g (0 g Oral Hold 01/09/17 1139)  albuterol (PROVENTIL) (2.5 MG/3ML) 0.083% nebulizer solution 2.5 mg (not administered)  aspirin EC tablet 81 mg (0 mg Oral Hold 01/09/17 1140)  cholecalciferol (VITAMIN D) tablet 1,000 Units (0 Units Oral Hold 01/09/17 1141)  finasteride (PROSCAR) tablet 5 mg (not administered)  isosorbide mononitrate (IMDUR) 24 hr tablet 15 mg (not administered)  multivitamin with minerals tablet 1 tablet (0 tablets Oral Hold 01/09/17 1142)  pravastatin (PRAVACHOL) tablet 40 mg (not administered)  losartan (COZAAR) tablet 50 mg (0 mg Oral Hold 01/09/17 1200)  tamsulosin (FLOMAX) capsule 0.4 mg (not administered)  methylPREDNISolone sodium succinate (SOLU-MEDROL) 125 mg/2 mL injection 60 mg (60 mg Intravenous Given 01/09/17 1209)  dextromethorphan-guaiFENesin (MUCINEX DM) 30-600 MG per 12 hr tablet 1 tablet (0 tablets Oral Hold 01/09/17 1143)  cefTRIAXone (  ROCEPHIN) 1 g in dextrose 5 % 50 mL IVPB (0 g Intravenous Stopped 01/09/17 1144)  azithromycin (ZITHROMAX) tablet 500 mg (500 mg Oral Given 01/09/17 0305)    Followed by  azithromycin (ZITHROMAX) tablet 250 mg (not administered)  enoxaparin (LOVENOX) injection 40 mg (not administered)  0.9 %  sodium chloride infusion ( Intravenous New Bag/Given 01/09/17 0328)  oseltamivir (TAMIFLU) capsule 30 mg (30 mg Oral Given 01/09/17 1405)  acetaminophen (TYLENOL) tablet 1,000 mg (1,000 mg Oral Given 01/08/17 2039)  ipratropium-albuterol (DUONEB) 0.5-2.5 (3) MG/3ML nebulizer solution 3 mL (3 mLs Nebulization Given 01/08/17 2040)  ipratropium-albuterol (DUONEB) 0.5-2.5 (3) MG/3ML nebulizer solution 3 mL (3 mLs Nebulization Given 01/09/17 0003)  acetaminophen (TYLENOL) suppository 650 mg (650 mg Rectal Given 01/09/17 1156)     NEW OUTPATIENT MEDICATIONS STARTED DURING  THIS VISIT:  None   Note:  This document was prepared using Dragon voice recognition software and may include unintentional dictation errors.  Nanda Quinton, MD Emergency Medicine   Margette Fast, MD 01/09/17 [phone]

## 2017-01-08 NOTE — H&P (Addendum)
History and Physical    [name] [id number] DOB: 07/28/1928 DOA: 01/08/2017  Referring MD/NP/PA:   PCP: Vista Mink, FNP   Patient coming from:  The patient is coming from home.  At baseline, pt is partially dependent for most of ADL.   Chief Complaint: Fever, productive cough, shortness breath  HPI: Terry Taylor is a 83 y.o. male with medical history significant of hypertension, hyperlipidemia, COPD, stroke, BPH, CAD, using a walker, who presents with fever, productive cough and SOB.  Patient states that he has been having fever, productive cough and shortness breath for about 3 days, which has been progressively getting worse. He denies chest pain, runny nose or sore throat. He does not have nausea, vomiting, diarrhea, abdominal pain, symptoms of UTI or unilateral weakness. He has bilateral leg edema.  ED Course: pt was found to have WBC 7.0, lactate 1.07, INR 1.07, BNP 159.1, negative urinalysis, electrolytes and renal function okay, chest x-ray showed bronchitis take change. Temperature 101.6, oxygen saturation 94%. Patient is placed on telemetry bed for observation.  Review of Systems:   General: has fevers, chills, has poor appetite, has fatigue HEENT: no blurry vision, hearing changes or sore throat Respiratory: has dyspnea, coughing, no wheezing CV: no chest pain, no palpitations GI: no nausea, vomiting, abdominal pain, diarrhea, constipation GU: no dysuria, burning on urination, increased urinary frequency, hematuria  Ext: has leg edema Neuro: no unilateral weakness, numbness, or tingling, no vision change or hearing loss Skin: no rash, no skin tear. MSK: No muscle spasm, no deformity, no limitation of range of movement in spin Heme: No easy bruising.  Travel history: No recent long distant travel.  Allergy:  Allergies  Allergen Reactions  . Bee Venom Anaphylaxis    Past Medical History:  Diagnosis Date  . Benign prostatic hypertrophy      hx  . Chronic airway obstruction, not elsewhere classified   . Coronary atherosclerosis of unspecified type of vessel, native or graft   . Osteoarthrosis, unspecified whether generalized or localized, unspecified site   . Pure hypercholesterolemia    IIA  . Stroke ([place])   . Unspecified essential hypertension     History reviewed. No pertinent surgical history.  Social History:  reports that he has quit smoking. His smoking use included Cigarettes. He quit after 40.00 years of use. He has never used smokeless tobacco. He reports that he drinks alcohol. He reports that he does not use drugs.  Family History:  Family History  Problem Relation Age of Onset  . Coronary artery disease      family hx  . Stroke Mother   . Stroke Father      Prior to Admission medications   Medication Sig Start Date End Date Taking? Authorizing Provider  albuterol (PROVENTIL) (2.5 MG/3ML) 0.083% nebulizer solution Take 2.5 mg by nebulization every 6 (six) hours as needed for wheezing or shortness of breath.    Historical Provider, MD  amLODipine (NORVASC) 10 MG tablet Take 0.5 tablets (5 mg total) by mouth daily. 09/14/16   Shanker Kristeen Mans, MD  aspirin EC 81 MG tablet Take 81 mg by mouth daily.    Historical Provider, MD  cholecalciferol (VITAMIN D) 1000 units tablet Take 1,000 Units by mouth daily.    Historical Provider, MD  finasteride (PROSCAR) 5 MG tablet Take 1 tablet (5 mg total) by mouth daily. 09/14/16   Shanker Kristeen Mans, MD  Fluticasone-Salmeterol (ADVAIR) 250-50 MCG/DOSE AEPB Inhale 1 puff into the lungs daily as  needed.     Historical Provider, MD  isosorbide mononitrate (IMDUR) 30 MG 24 hr tablet Take 0.5 tablets (15 mg total) by mouth daily. 09/13/16   Shanker Kristeen Mans, MD  losartan (COZAAR) 50 MG tablet Take 1 tablet (50 mg total) by mouth daily. 03/10/13   Srikar Janna Arch, MD  meloxicam (MOBIC) 7.5 MG tablet Take 7.5 mg by mouth daily.    Historical Provider, MD  Multiple Vitamin  (MULTIVITAMIN WITH MINERALS) TABS tablet Take 1 tablet by mouth daily.    Historical Provider, MD  pravastatin (PRAVACHOL) 40 MG tablet Take 40 mg by mouth daily.    Historical Provider, MD  tamsulosin (FLOMAX) 0.4 MG CAPS Take 0.4 mg by mouth daily.    Historical Provider, MD  tiotropium (SPIRIVA HANDIHALER) 18 MCG inhalation capsule Place 18 mcg into inhaler and inhale daily.    Historical Provider, MD    Physical Exam: Vitals:   01/09/17 0000 01/09/17 0003 01/09/17 0045 01/09/17 0157  BP: 124/63  138/99 167/69  Pulse: 72  81 79  Resp:    22  Temp:    98.6 F (37 C)  TempSrc:    Oral  SpO2: 97% 99% 96% 96%   General: Not in acute distress HEENT:       Eyes: PERRL, EOMI, no scleral icterus.       ENT: No discharge from the ears and nose, no pharynx injection, no tonsillar enlargement.        Neck: No JVD, no bruit, no mass felt. Heme: No neck lymph node enlargement. Cardiac: S1/S2, RRR, No murmurs, No gallops or rubs. Respiratory: has rhonchi bilaterally. No rales, wheezing, rhonchi or rubs. GI: Soft, nondistended, nontender, no rebound pain, no organomegaly, BS present. GU: No hematuria Ext: 1+ pitting leg edema bilaterally. 2+DP/PT pulse bilaterally. Musculoskeletal: No joint deformities, No joint redness or warmth, no limitation of ROM in spin. Skin: No rashes.  Neuro: Alert, oriented X3, cranial nerves II-XII grossly intact, moves all extremities. Psych: Patient is not psychotic, no suicidal or hemocidal ideation.  Labs on Admission: I have personally reviewed following labs and imaging studies  CBC:  Recent Labs Lab 01/08/17 1944  WBC 7.0  NEUTROABS 6.1  HGB 12.8*  HCT 37.6*  MCV 85.6  PLT [id number]*   Basic Metabolic Panel:  Recent Labs Lab 01/08/17 1944  NA 137  K 3.9  CL 104  CO2 25  GLUCOSE 115*  BUN 16  CREATININE 1.13  CALCIUM 8.5*   GFR: CrCl cannot be calculated (Unknown ideal weight.). Liver Function Tests:  Recent Labs Lab 01/08/17 1944    AST 24  ALT 16*  ALKPHOS 40  BILITOT 0.9  PROT 5.5*  ALBUMIN 3.3*   No results for input(s): LIPASE, AMYLASE in the last 168 hours. No results for input(s): AMMONIA in the last 168 hours. Coagulation Profile: No results for input(s): INR, PROTIME in the last 168 hours. Cardiac Enzymes:  Recent Labs Lab 01/09/17 0140  TROPONINI <0.03   BNP (last 3 results) No results for input(s): PROBNP in the last 8760 hours. HbA1C: No results for input(s): HGBA1C in the last 72 hours. CBG: No results for input(s): GLUCAP in the last 168 hours. Lipid Profile:  Recent Labs  01/09/17 0149  CHOL 104  HDL 46  LDLCALC 50  TRIG 40  CHOLHDL 2.3   Thyroid Function Tests:  Recent Labs  01/09/17 0140  TSH 1.062  FREET4 0.98   Anemia Panel: No results for input(s): VITAMINB12, FOLATE, FERRITIN,  TIBC, IRON, RETICCTPCT in the last 72 hours. Urine analysis:    Component Value Date/Time   COLORURINE YELLOW 01/08/2017 2110   APPEARANCEUR CLEAR 01/08/2017 2110   APPEARANCEUR Clear 10/08/2014 0848   LABSPEC 1.025 01/08/2017 2110   LABSPEC 1.009 10/08/2014 0848   PHURINE 5.0 01/08/2017 2110   GLUCOSEU NEGATIVE 01/08/2017 2110   GLUCOSEU Negative 10/08/2014 0848   HGBUR SMALL (A) 01/08/2017 2110   BILIRUBINUR NEGATIVE 01/08/2017 2110   BILIRUBINUR Negative 10/08/2014 0848   KETONESUR 5 (A) 01/08/2017 2110   PROTEINUR NEGATIVE 01/08/2017 2110   UROBILINOGEN 1.0 03/06/2013 1924   NITRITE NEGATIVE 01/08/2017 2110   LEUKOCYTESUR NEGATIVE 01/08/2017 2110   LEUKOCYTESUR Negative 10/08/2014 0848   Sepsis Labs: @LABRCNTIP (procalcitonin:4,lacticidven:4) )No results found for this or any previous visit (from the past 240 hour(s)).   Radiological Exams on Admission: Dg Chest 2 View  Result Date: 01/08/2017 CLINICAL DATA:  Shortness of breath. EXAM: CHEST  2 VIEW COMPARISON:  09/12/2016 FINDINGS: The cardiac silhouette, mediastinal and hilar contours are within normal limits and stable.  There is moderate tortuosity and calcification of the thoracic aorta. No definite infiltrates or edema. Streaky areas of atelectasis and mild chronic bronchitic changes. Could not exclude small effusions. The bony thorax is intact. Stable degenerative changes involving both shoulders. IMPRESSION: Chronic bronchitic changes and streaky bibasilar atelectasis. Could not exclude small effusions. No edema or infiltrates. Electronically Signed   By: Marijo Sanes M.D.   On: 01/08/2017 20:40     EKG: Independently reviewed.  A fib, QTc 438, lower voltage.   Assessment/Plan Principal Problem:   COPD exacerbation ([facility]) Active Problems:   HYPERCHOLESTEROLEMIA  IIA   Essential hypertension   Coronary artery disease due to lipid rich plaque   New onset atrial fibrillation ([facility])   COPD (chronic obstructive pulmonary disease) ([facility])   COPD exacerbation ([place]): Patient has productive cough, shortness breath, and rhonchi on auscultation, consistent with COPD exacerbation. Chest x-ray has no infiltration.  -will place on telemetry bed for obs -Nebulizers: scheduled Duoneb and prn albuterol -Solu-Medrol 60 mg IV bid -Abx: Rocephin and azithromycin -Mucinex for cough  -Urine S. pneumococcal antigen -Follow up blood culture x2, sputum culture, Flu pcr  Addendum: Influenza A pcr is positive -will start Tamiflu  New onset atrial fibrillation: EKG showed new onset A fib without RVR. Pt has no CP. Likely triggered by COPD exacerbation. CHA2DS2-VASc Score is 5, needs long term oral anticoagulation. Heart rate is well controlled. Pt also has leg edema, need to r/o CHF. - cycle CE q6 x3 - continue aspirin - Risk factor stratification: will check FLP and A1C  - 2d echo - check TSH and free T4 - please call Card in AM and discuss about long term oral anticoagulation.  CAD: no CP -continue ASA and pravastatin, Imdur  HLD: Last LDL was not on record -Continue home medications: Pravastatin -Check  FLP  HTN: -continue cozarr    BPH: stable - Continue Flomax and proscar   DVT ppx: SQ Lovenox Code Status: Full code Family Communication: Yes, patient's daughter and son  at bed side Disposition Plan:  Anticipate discharge back to previous home environment Consults called:  none Admission status: Obs / tele    Date of Service 01/09/2017    Ivor Costa [facility] Pager [phone]  If 7PM-7AM, please contact night-coverage [email or web address] Password [email or web address] 01/09/2017, 3:00 AM

## 2017-01-09 ENCOUNTER — Encounter (HOSPITAL_COMMUNITY): Payer: Self-pay | Admitting: General Practice

## 2017-01-09 DIAGNOSIS — J441 Chronic obstructive pulmonary disease with (acute) exacerbation: Secondary | ICD-10-CM

## 2017-01-09 DIAGNOSIS — Z87440 Personal history of urinary (tract) infections: Secondary | ICD-10-CM | POA: Diagnosis not present

## 2017-01-09 DIAGNOSIS — J101 Influenza due to other identified influenza virus with other respiratory manifestations: Secondary | ICD-10-CM | POA: Diagnosis present

## 2017-01-09 DIAGNOSIS — N390 Urinary tract infection, site not specified: Secondary | ICD-10-CM | POA: Diagnosis present

## 2017-01-09 DIAGNOSIS — E785 Hyperlipidemia, unspecified: Secondary | ICD-10-CM | POA: Diagnosis present

## 2017-01-09 DIAGNOSIS — I2583 Coronary atherosclerosis due to lipid rich plaque: Secondary | ICD-10-CM | POA: Diagnosis present

## 2017-01-09 DIAGNOSIS — Z87891 Personal history of nicotine dependence: Secondary | ICD-10-CM | POA: Diagnosis not present

## 2017-01-09 DIAGNOSIS — Z7952 Long term (current) use of systemic steroids: Secondary | ICD-10-CM | POA: Diagnosis not present

## 2017-01-09 DIAGNOSIS — Z91030 Bee allergy status: Secondary | ICD-10-CM | POA: Diagnosis not present

## 2017-01-09 DIAGNOSIS — Z8673 Personal history of transient ischemic attack (TIA), and cerebral infarction without residual deficits: Secondary | ICD-10-CM | POA: Diagnosis not present

## 2017-01-09 DIAGNOSIS — E7800 Pure hypercholesterolemia, unspecified: Secondary | ICD-10-CM | POA: Diagnosis present

## 2017-01-09 DIAGNOSIS — I10 Essential (primary) hypertension: Secondary | ICD-10-CM | POA: Diagnosis present

## 2017-01-09 DIAGNOSIS — N400 Enlarged prostate without lower urinary tract symptoms: Secondary | ICD-10-CM | POA: Diagnosis present

## 2017-01-09 DIAGNOSIS — Z791 Long term (current) use of non-steroidal anti-inflammatories (NSAID): Secondary | ICD-10-CM | POA: Diagnosis not present

## 2017-01-09 DIAGNOSIS — I2510 Atherosclerotic heart disease of native coronary artery without angina pectoris: Secondary | ICD-10-CM | POA: Diagnosis present

## 2017-01-09 DIAGNOSIS — F05 Delirium due to known physiological condition: Secondary | ICD-10-CM | POA: Diagnosis present

## 2017-01-09 DIAGNOSIS — Z7982 Long term (current) use of aspirin: Secondary | ICD-10-CM | POA: Diagnosis not present

## 2017-01-09 DIAGNOSIS — Z8249 Family history of ischemic heart disease and other diseases of the circulatory system: Secondary | ICD-10-CM | POA: Diagnosis not present

## 2017-01-09 DIAGNOSIS — F0390 Unspecified dementia without behavioral disturbance: Secondary | ICD-10-CM | POA: Diagnosis present

## 2017-01-09 DIAGNOSIS — Z823 Family history of stroke: Secondary | ICD-10-CM | POA: Diagnosis not present

## 2017-01-09 DIAGNOSIS — I480 Paroxysmal atrial fibrillation: Secondary | ICD-10-CM | POA: Diagnosis present

## 2017-01-09 DIAGNOSIS — I4891 Unspecified atrial fibrillation: Secondary | ICD-10-CM | POA: Diagnosis not present

## 2017-01-09 DIAGNOSIS — Z79899 Other long term (current) drug therapy: Secondary | ICD-10-CM | POA: Diagnosis not present

## 2017-01-09 LAB — STREP PNEUMONIAE URINARY ANTIGEN: STREP PNEUMO URINARY ANTIGEN: NEGATIVE

## 2017-01-09 LAB — APTT: aPTT: 35 seconds (ref 24–36)

## 2017-01-09 LAB — LIPID PANEL
Cholesterol: 104 mg/dL (ref 0–200)
HDL: 46 mg/dL (ref >40)
LDL CALC: 50 mg/dL (ref 0–99)
Total CHOL/HDL Ratio: 2.3 RATIO
Triglycerides: 40 mg/dL (ref <150)
VLDL: 8 mg/dL (ref 0–40)

## 2017-01-09 LAB — CBC
HEMATOCRIT: 34.7 % — AB (ref 39.0–52.0)
HEMOGLOBIN: 11.9 g/dL — AB (ref 13.0–17.0)
MCH: 29.3 pg (ref 26.0–34.0)
MCHC: 34.3 g/dL (ref 30.0–36.0)
MCV: 85.5 fL (ref 78.0–100.0)
PLATELETS: 140 10*3/uL — AB (ref 150–400)
RBC: 4.06 MIL/uL — AB (ref 4.22–5.81)
RDW: 14.8 % (ref 11.5–15.5)
WBC: 9.3 10*3/uL (ref 4.0–10.5)

## 2017-01-09 LAB — BASIC METABOLIC PANEL
Anion gap: 9 (ref 5–15)
BUN: 22 mg/dL — ABNORMAL HIGH (ref 6–20)
CHLORIDE: 105 mmol/L (ref 101–111)
CO2: 22 mmol/L (ref 22–32)
CREATININE: 1.11 mg/dL (ref 0.61–1.24)
Calcium: 8 mg/dL — ABNORMAL LOW (ref 8.9–10.3)
GFR calc non Af Amer: 58 mL/min — ABNORMAL LOW (ref >60)
Glucose, Bld: 184 mg/dL — ABNORMAL HIGH (ref 65–99)
POTASSIUM: 4.1 mmol/L (ref 3.5–5.1)
Sodium: 136 mmol/L (ref 135–145)

## 2017-01-09 LAB — T4, FREE: FREE T4: 0.98 ng/dL (ref 0.61–1.12)

## 2017-01-09 LAB — PROTIME-INR
INR: 1.25
PROTHROMBIN TIME: 15.8 s — AB (ref 11.4–15.2)

## 2017-01-09 LAB — LACTIC ACID, PLASMA
LACTIC ACID, VENOUS: 1.2 mmol/L (ref 0.5–1.9)
Lactic Acid, Venous: 1.5 mmol/L (ref 0.5–1.9)

## 2017-01-09 LAB — INFLUENZA PANEL BY PCR (TYPE A & B)
INFLAPCR: POSITIVE — AB
Influenza B By PCR: NEGATIVE

## 2017-01-09 LAB — TROPONIN I: Troponin I: <0.03 ng/mL (ref <0.03)

## 2017-01-09 LAB — TSH: TSH: 1.062 u[IU]/mL (ref 0.350–4.500)

## 2017-01-09 LAB — PROCALCITONIN: Procalcitonin: 0.13 ng/mL

## 2017-01-09 MED ORDER — ENOXAPARIN SODIUM 40 MG/0.4ML ~~LOC~~ SOLN
40.0000 mg | SUBCUTANEOUS | Status: DC
  Filled 2017-01-09: qty 0.4

## 2017-01-09 MED ORDER — ACETAMINOPHEN 650 MG RE SUPP
650.0000 mg | Freq: Once | RECTAL | Status: AC
  Administered 2017-01-09: 650 mg via RECTAL
  Filled 2017-01-09: qty 1

## 2017-01-09 MED ORDER — METHYLPREDNISOLONE SODIUM SUCC 125 MG IJ SOLR
60.0000 mg | Freq: Two times a day (BID) | INTRAMUSCULAR | Status: DC
  Administered 2017-01-09 – 2017-01-10 (×4): 60 mg via INTRAVENOUS
  Filled 2017-01-09 (×4): qty 2

## 2017-01-09 MED ORDER — ADULT MULTIVITAMIN W/MINERALS CH
1.0000 | ORAL_TABLET | Freq: Every day | ORAL | Status: DC
  Administered 2017-01-10: 1 via ORAL
  Filled 2017-01-09 (×2): qty 1

## 2017-01-09 MED ORDER — LOSARTAN POTASSIUM 50 MG PO TABS
50.0000 mg | ORAL_TABLET | Freq: Every day | ORAL | Status: DC
Start: 2017-01-09 — End: 2017-01-11
  Administered 2017-01-10 – 2017-01-11 (×2): 50 mg via ORAL
  Filled 2017-01-09 (×3): qty 1

## 2017-01-09 MED ORDER — FINASTERIDE 5 MG PO TABS
5.0000 mg | ORAL_TABLET | Freq: Every day | ORAL | Status: DC
  Administered 2017-01-09 – 2017-01-11 (×3): 5 mg via ORAL
  Filled 2017-01-09 (×4): qty 1

## 2017-01-09 MED ORDER — HYPROMELLOSE (GONIOSCOPIC) 2.5 % OP SOLN: 1.0000 [drp] | Freq: Four times a day (QID) | OPHTHALMIC | Status: DC | PRN

## 2017-01-09 MED ORDER — ISOSORBIDE MONONITRATE ER 30 MG PO TB24
15.0000 mg | ORAL_TABLET | Freq: Every day | ORAL | Status: DC
  Administered 2017-01-09 – 2017-01-11 (×3): 15 mg via ORAL
  Filled 2017-01-09 (×3): qty 1

## 2017-01-09 MED ORDER — PRAVASTATIN SODIUM 40 MG PO TABS
40.0000 mg | ORAL_TABLET | Freq: Every day | ORAL | Status: DC
  Administered 2017-01-09 – 2017-01-11 (×3): 40 mg via ORAL
  Filled 2017-01-09 (×3): qty 1

## 2017-01-09 MED ORDER — DM-GUAIFENESIN ER 30-600 MG PO TB12
1.0000 | ORAL_TABLET | Freq: Two times a day (BID) | ORAL | Status: DC
  Administered 2017-01-09 – 2017-01-11 (×5): 1 via ORAL
  Filled 2017-01-09 (×5): qty 1

## 2017-01-09 MED ORDER — TAMSULOSIN HCL 0.4 MG PO CAPS
0.4000 mg | ORAL_CAPSULE | Freq: Every day | ORAL | Status: DC
  Administered 2017-01-09 – 2017-01-11 (×3): 0.4 mg via ORAL
  Filled 2017-01-09 (×3): qty 1

## 2017-01-09 MED ORDER — VITAMIN D 1000 UNITS PO TABS
1000.0000 [IU] | ORAL_TABLET | Freq: Every day | ORAL | Status: DC
  Administered 2017-01-10: 1000 [IU] via ORAL
  Filled 2017-01-09 (×2): qty 1

## 2017-01-09 MED ORDER — AZITHROMYCIN 250 MG PO TABS
500.0000 mg | ORAL_TABLET | Freq: Every day | ORAL | Status: AC
  Administered 2017-01-09: 500 mg via ORAL
  Filled 2017-01-09: qty 2

## 2017-01-09 MED ORDER — DEXTROSE 5 % IV SOLN
1.0000 g | INTRAVENOUS | Status: DC
  Administered 2017-01-09 – 2017-01-11 (×3): 1 g via INTRAVENOUS
  Filled 2017-01-09 (×3): qty 10

## 2017-01-09 MED ORDER — APIXABAN 5 MG PO TABS
5.0000 mg | ORAL_TABLET | Freq: Two times a day (BID) | ORAL | Status: DC
  Administered 2017-01-09 – 2017-01-10 (×2): 5 mg via ORAL
  Filled 2017-01-09 (×4): qty 1

## 2017-01-09 MED ORDER — IPRATROPIUM-ALBUTEROL 0.5-2.5 (3) MG/3ML IN SOLN
3.0000 mL | RESPIRATORY_TRACT | Status: DC
  Administered 2017-01-09 (×5): 3 mL via RESPIRATORY_TRACT
  Filled 2017-01-09 (×3): qty 3

## 2017-01-09 MED ORDER — ACETAMINOPHEN 500 MG PO TABS
500.0000 mg | ORAL_TABLET | Freq: Four times a day (QID) | ORAL | Status: DC | PRN
  Administered 2017-01-09: 500 mg via ORAL
  Filled 2017-01-09: qty 1

## 2017-01-09 MED ORDER — ALBUTEROL SULFATE (2.5 MG/3ML) 0.083% IN NEBU
2.5000 mg | INHALATION_SOLUTION | RESPIRATORY_TRACT | Status: DC | PRN
  Filled 2017-01-09: qty 3

## 2017-01-09 MED ORDER — ASPIRIN EC 81 MG PO TBEC
81.0000 mg | DELAYED_RELEASE_TABLET | Freq: Every day | ORAL | Status: DC
  Administered 2017-01-10 – 2017-01-11 (×2): 81 mg via ORAL
  Filled 2017-01-09 (×3): qty 1

## 2017-01-09 MED ORDER — IPRATROPIUM-ALBUTEROL 0.5-2.5 (3) MG/3ML IN SOLN
3.0000 mL | Freq: Three times a day (TID) | RESPIRATORY_TRACT | Status: DC
  Administered 2017-01-10: 3 mL via RESPIRATORY_TRACT
  Filled 2017-01-09: qty 3

## 2017-01-09 MED ORDER — OSELTAMIVIR PHOSPHATE 30 MG PO CAPS
30.0000 mg | ORAL_CAPSULE | Freq: Two times a day (BID) | ORAL | Status: DC
  Administered 2017-01-09 – 2017-01-11 (×5): 30 mg via ORAL
  Filled 2017-01-09 (×6): qty 1

## 2017-01-09 MED ORDER — OMEGA-3-ACID ETHYL ESTERS 1 G PO CAPS
1.0000 g | ORAL_CAPSULE | Freq: Every day | ORAL | Status: DC
  Administered 2017-01-10: 1 g via ORAL
  Filled 2017-01-09: qty 1

## 2017-01-09 MED ORDER — AZITHROMYCIN 500 MG PO TABS
250.0000 mg | ORAL_TABLET | Freq: Every day | ORAL | Status: DC
  Administered 2017-01-10 – 2017-01-11 (×2): 250 mg via ORAL
  Filled 2017-01-09 (×3): qty 1

## 2017-01-09 MED ORDER — SODIUM CHLORIDE 0.9 % IV SOLN
INTRAVENOUS | Status: DC
  Administered 2017-01-09 (×2): via INTRAVENOUS
  Administered 2017-01-10: 1000 mL via INTRAVENOUS
  Administered 2017-01-10: 15:00:00 via INTRAVENOUS
  Administered 2017-01-11: 1000 mL via INTRAVENOUS

## 2017-01-09 NOTE — ED Notes (Signed)
Family at bedside. 

## 2017-01-09 NOTE — ED Notes (Signed)
Pt continues to refuse medications, pts daughter at bedside, pt answers questions appropriately, pt adamantly refusing medications, pt aware of potential side effects of not taking medications, MD paged

## 2017-01-09 NOTE — ED Notes (Signed)
Admitting md at bedside

## 2017-01-09 NOTE — ED Notes (Signed)
Pt refuses to keep on O2 sat probe this RN has placed the probe on multiple sites, attempting to have second RN assist with Tylenol suppository

## 2017-01-09 NOTE — ED Notes (Signed)
Dr. Blaine Hamper notified of increased confusion, decreased urine output. Orders received.

## 2017-01-09 NOTE — Progress Notes (Signed)
Terry Taylor is a 83 y.o. male patient admitted from ED awake, alert - oriented  X 4 - no acute distress noted.  VSS - Blood pressure (!) 152/86, pulse 78, temperature 98.6 F (37 C), temperature source Oral, resp. rate 20, SpO2 96 %.    IV in place, occlusive dsg intact without redness.  Orientation to room, and floor completed with information packet given to patient/family.  Patient watched safety video at this time.  Admission INP armband ID verified with patient/family, and in place.   SR up x 2, fall assessment complete, with patient and family able to verbalize understanding of risk associated with falls, and verbalized understanding to call nsg before up out of bed.  Call light within reach, patient able to voice, and demonstrate understanding.  Skin, clean-dry- intact with evidence of bruising and a deep tissue injury noted on exam.    Will cont to eval and treat per MD orders.  Celine Ahr, RN 01/09/2017 6:27 PM

## 2017-01-09 NOTE — ED Notes (Signed)
Pt more alert at this time, pt responds to verbal stimuli & is talking to his daughter at bedside

## 2017-01-09 NOTE — ED Notes (Signed)
Rizwan, MD aware pt is refusing meds, per MD pt to get rectal tylenol d/t fever & pts daughter reports he is confused and not able to make decisions, Tamiful, Flomax, Proscar as soon as pt will take PO meds

## 2017-01-09 NOTE — ED Notes (Signed)
Pt refuses tylenol & Tamiflu at this time, will wait for the pts daughter to come back to room to discuss plan

## 2017-01-09 NOTE — ED Notes (Signed)
Spoke with admitting, pt to have step down admit order  Placed d/t decreased LOC, pt requires sternal rub to alert, vitals WNL, per pts daughter the pts LOC has decreased

## 2017-01-09 NOTE — Progress Notes (Signed)
[facility]  YARON RINDERKNECHT is a 83 y.o. male with medical history significant of hypertension, hyperlipidemia, COPD, stroke, BPH, CAD, using a walker, who presents with fever, productive cough and SOB. Temp 101.6, pulse ox mid 80s on room air.   Principal Problem:   COPD exacerbation due to Influenza A - cont Solumedrol- start Tamiflu - cont Nebs, Tylenol   Active Problems:   HYPERCHOLESTEROLEMIA  IIA   Essential hypertension - Losartan    Coronary artery disease due to lipid rich plaque - ASA , statin    New onset atrial fibrillation  - rate is controlled- CHA2DS2-VASc Score 5 - start Eliquis - ECHO - TFTs WNL   Debbe Odea, MD

## 2017-01-09 NOTE — ED Notes (Signed)
Heart Healthy Diet Ordered for patient for Lunch.

## 2017-01-10 ENCOUNTER — Inpatient Hospital Stay (HOSPITAL_COMMUNITY): Payer: Medicare HMO

## 2017-01-10 DIAGNOSIS — I4891 Unspecified atrial fibrillation: Secondary | ICD-10-CM

## 2017-01-10 DIAGNOSIS — J101 Influenza due to other identified influenza virus with other respiratory manifestations: Principal | ICD-10-CM

## 2017-01-10 LAB — ECHOCARDIOGRAM COMPLETE

## 2017-01-10 LAB — HEMOGLOBIN A1C
HEMOGLOBIN A1C: 5 % (ref 4.8–5.6)
Mean Plasma Glucose: 97 mg/dL

## 2017-01-10 LAB — TROPONIN I

## 2017-01-10 MED ORDER — PREDNISONE 50 MG PO TABS
60.0000 mg | ORAL_TABLET | Freq: Every day | ORAL | Status: DC
  Administered 2017-01-11: 60 mg via ORAL
  Filled 2017-01-10: qty 1

## 2017-01-10 MED ORDER — ENOXAPARIN SODIUM 40 MG/0.4ML ~~LOC~~ SOLN: 40.0000 mg | SUBCUTANEOUS | Status: DC

## 2017-01-10 MED ORDER — IPRATROPIUM-ALBUTEROL 0.5-2.5 (3) MG/3ML IN SOLN: 3.0000 mL | RESPIRATORY_TRACT | Status: DC | PRN

## 2017-01-10 NOTE — Evaluation (Signed)
Occupational Therapy Evaluation Patient Details Name: Terry Taylor MRN: [id number] DOB: 10-05-1928 Today's Date: 01/10/2017    History of Present Illness 83 y.o. male with medical history significant of hypertension, hyperlipidemia, COPD, stroke, BPH, CAD, using a walker, who presents with fever, productive cough and SOB.   Clinical Impression   Pt reports he required assist for all ADL PTA. Currently pt max assist for sit to stand from chair and max-total assist for ADL. Discussed [place] aide to decrease burden of care at home; daughter reports they are not interested at this time but have contact information for agency if needed. Pt planning to d/c home with 24/7 assist from family. No further acute OT needs identified; signing off at this time. Please re-consult if needs change. Thank you for this referral.    Follow Up Recommendations  No OT follow up;Supervision/Assistance - 24 hour    Equipment Recommendations  None recommended by OT    Recommendations for Other Services       Precautions / Restrictions Precautions Precautions: Fall Restrictions Weight Bearing Restrictions: No      Mobility Bed Mobility Overal bed mobility: Needs Assistance Bed Mobility: Supine to Sit     Supine to sit: Mod assist     General bed mobility comments: Pt OOB in chair upon arrival.  Transfers Overall transfer level: Needs assistance Equipment used: None Transfers: Sit to/from Stand Sit to Stand: Max assist Stand pivot transfers: Min assist       General transfer comment: Max assist to boost up from chair x1.     Balance Overall balance assessment: Needs assistance Sitting-balance support: Feet supported;Bilateral upper extremity supported Sitting balance-Leahy Scale: Fair     Standing balance support: Bilateral upper extremity supported Standing balance-Leahy Scale: Poor Standing balance comment: requires UE support                            ADL Overall  ADL's : Needs assistance/impaired Eating/Feeding: Set up;Sitting   Grooming: Minimal assistance;Sitting   Upper Body Bathing: Maximal assistance;Sitting   Lower Body Bathing: Total assistance;Sit to/from stand   Upper Body Dressing : Moderate assistance;Sitting   Lower Body Dressing: Total assistance;Sit to/from stand   Toilet Transfer: Maximal assistance;Stand-pivot;BSC Toilet Transfer Details (indicate cue type and reason): Simulated by sit to stand from chair.         Functional mobility during ADLs: Maximal assistance (for sit to stand only) General ADL Comments: Discussed [place] aide for decreased burden of care at home with pt and daughter; daughter reports they are currently not interested in [facility] but have the phone number if needed.     Vision Vision Assessment?: No apparent visual deficits   Perception     Praxis      Pertinent Vitals/Pain Pain Assessment: No/denies pain Faces Pain Scale: Hurts little more Pain Location: arthritis Pain Descriptors / Indicators: Grimacing Pain Intervention(s): Monitored during session     Hand Dominance     Extremity/Trunk Assessment Upper Extremity Assessment Upper Extremity Assessment: Generalized weakness   Lower Extremity Assessment Lower Extremity Assessment: Defer to PT evaluation   Cervical / Trunk Assessment Cervical / Trunk Assessment: Kyphotic   Communication Communication Communication: HOH   Cognition Arousal/Alertness: Awake/alert Behavior During Therapy: Flat affect Overall Cognitive Status: History of cognitive impairments - at baseline                 General Comments: Daughter reports intermittent confusion at home.  General Comments       Exercises       Shoulder Instructions      Home Living Family/patient expects to be discharged to:: Private residence Living Arrangements: Spouse/significant other Available Help at Discharge: Family;Available 24 hours/day Type of Home: House Home  Access: Level entry     Home Layout: Two level;Able to live on main level with bedroom/bathroom     Bathroom Shower/Tub: Tub/shower unit   Bathroom Toilet: Handicapped height     Home Equipment: Other (comment);Hospital bed;Grab bars - toilet;Bedside commode;Wheelchair - manual (3 wheeled RW)   Additional Comments: has urinal, uses diapers, lift chair      Prior Functioning/Environment Level of Independence: Needs assistance  Gait / Transfers Assistance Needed: ambulates with 3WRW short distances with A, but daughter says it is not good quality ADL's / Homemaking Assistance Needed: wife assists with all ADL            OT Problem List:     OT Treatment/Interventions:      OT Goals(Current goals can be found in the care plan section) Acute Rehab OT Goals Patient Stated Goal: go home with family OT Goal Formulation: All assessment and education complete, DC therapy  OT Frequency:     Barriers to D/C:            Co-evaluation              End of Session Equipment Utilized During Treatment: Gait belt Nurse Communication: Mobility status  Activity Tolerance: Patient tolerated treatment well Patient left: in chair;with call bell/phone within reach;with family/visitor present;with chair alarm set;with nursing/sitter in room   Time: 1012-1027 OT Time Calculation (min): 15 min Charges:  OT General Charges $OT Visit: 1 Procedure OT Evaluation $OT Eval Moderate Complexity: 1 Procedure G-Codes:     Binnie Kand M.S., OTR/L Pager: [phone]  01/10/2017, 10:39 AM

## 2017-01-10 NOTE — Progress Notes (Signed)
  Echocardiogram 2D Echocardiogram has been performed.  Terry Taylor 01/10/2017, 2:39 PM

## 2017-01-10 NOTE — Evaluation (Signed)
Physical Therapy Evaluation Patient Details Name: Terry Taylor MRN: [id number] DOB: [birth year] Today's Date: 01/10/2017   History of Present Illness  83 y.o. male with medical history significant of hypertension, hyperlipidemia, COPD, stroke, BPH, CAD, using a walker, who presents with fever, productive cough and SOB.  Clinical Impression  Pt admitted with above diagnosis. Pt currently with functional limitations due to the deficits listed below (see PT Problem List).  Pt will benefit from skilled PT to increase their independence and safety with mobility to allow discharge to the venue listed below.  Pt has all DME at home and supportive family.  Recommend HHPT for home safety evaluation.     Follow Up Recommendations Home health PT;Supervision/Assistance - 24 hour;Supervision for mobility/OOB    Equipment Recommendations  None recommended by PT    Recommendations for Other Services       Precautions / Restrictions Precautions Precautions: Fall Restrictions Weight Bearing Restrictions: No      Mobility  Bed Mobility Overal bed mobility: Needs Assistance Bed Mobility: Supine to Sit     Supine to sit: Mod assist     General bed mobility comments: MOD A for trunk and use of bed pad to get patient scooted to EOB.  Transfers Overall transfer level: Needs assistance   Transfers: Sit to/from Stand;Stand Pivot Transfers Sit to Stand: Max assist Stand pivot transfers: Min assist       General transfer comment: On first attempt, pt with retropulsion and feet sliding forward. Sat and re-positioned and stood 2nd time with MAX and cueing for hands.  Once up, MIN A for SPT very slow transition to chair with RW.  Ambulation/Gait             General Gait Details: deferred  Stairs            Wheelchair Mobility    Modified Rankin (Stroke Patients Only)       Balance Overall balance assessment: Needs assistance Sitting-balance support: Feet  supported Sitting balance-Leahy Scale: Fair       Standing balance-Leahy Scale: Poor Standing balance comment: requires UE support                             Pertinent Vitals/Pain Pain Assessment: Faces Faces Pain Scale: Hurts little more Pain Location: arthritis Pain Descriptors / Indicators: Grimacing Pain Intervention(s): Monitored during session    Home Living Family/patient expects to be discharged to:: Private residence Living Arrangements: Spouse/significant other Available Help at Discharge: Family;Available 24 hours/day Type of Home: House Home Access: Level entry     Home Layout: Two level;Able to live on main level with bedroom/bathroom Home Equipment: Other (comment);Hospital bed;Grab bars - toilet;Bedside commode;Wheelchair - manual [phone]) Additional Comments: has urinal, uses diapers, lift chair    Prior Function Level of Independence: Needs assistance   Gait / Transfers Assistance Needed: ambulates with 3WRW short distances with A, but daughter says it is not good quality  ADL's / Homemaking Assistance Needed: wife bathes him        Hand Dominance        Extremity/Trunk Assessment   Upper Extremity Assessment Upper Extremity Assessment: Defer to OT evaluation    Lower Extremity Assessment Lower Extremity Assessment: Generalized weakness       Communication   Communication: HOH  Cognition Arousal/Alertness: Awake/alert Behavior During Therapy: Flat affect Overall Cognitive Status: History of cognitive impairments - at baseline  General Comments: Daughter reports intermittent confusion at home.    General Comments      Exercises     Assessment/Plan    PT Assessment Patient needs continued PT services  PT Problem List Decreased strength;Decreased activity tolerance;Decreased balance;Decreased mobility;Decreased knowledge of use of DME;Decreased safety awareness          PT Treatment Interventions  DME instruction;Gait training;Functional mobility training;Therapeutic activities;Therapeutic exercise;Balance training;Patient/family education    PT Goals (Current goals can be found in the Care Plan section)  Acute Rehab PT Goals Patient Stated Goal: go home with family PT Goal Formulation: With family Time For Goal Achievement: 01/24/17 Potential to Achieve Goals: Good    Frequency Min 3X/week   Barriers to discharge        Co-evaluation               End of Session Equipment Utilized During Treatment: Gait belt Activity Tolerance: Patient limited by fatigue Patient left: in chair;with call bell/phone within reach;with chair alarm set;with nursing/sitter in room Nurse Communication: Mobility status         Time: [id number] PT Time Calculation (min) (ACUTE ONLY): 30 min   Charges:   PT Evaluation $PT Eval Moderate Complexity: 1 Procedure PT Treatments $Therapeutic Activity: 8-22 mins   PT G Codes:        Aliyanna Wassmer LUBECK 01/10/2017, 9:27 AM

## 2017-01-10 NOTE — Progress Notes (Addendum)
PROGRESS NOTE    Terry Taylor  [id number] DOB: [birth year] DOA: 01/08/2017  PCP: Vista Mink, FNP   Brief Narrative:  Duaine Dredge Summersis a 83 y.o.malewith medical history significant of hypertension, hyperlipidemia, COPD, stroke, BPH, CAD, using a walker, who presents with fever, productive cough and SOB. Temp 101.6, pulse ox mid 80s on room air.   Subjective: Per daughter, he has continued cough and congestion.   Assessment & Plan:   Principal Problem:   COPD exacerbation due to Influenza A - wean Solumedrol to Prednisone - start Tamiflu - cont Nebs, Tylenol   Active Problems:   HYPERCHOLESTEROLEMIA     Essential hypertension - Losartan    Coronary artery disease due to lipid rich plaque - ASA , statin    New onset atrial fibrillation  - rate is controlled- CHA2DS2-VASc Score 5 - not a good candidate for Eliquis due to frailty and high fall risk - ECHO - TFTs WNL  Dementia with acute delirium - delirium improving  DVT prophylaxis:  Lovenox Code Status: Full code Family Communication: daughter Disposition Plan:  Consultants:    Procedures:    Antimicrobials:  Anti-infectives    Start     Dose/Rate Route Frequency Ordered Stop   01/10/17 1000  azithromycin (ZITHROMAX) tablet 250 mg     250 mg Oral Daily 01/09/17 0112 01/14/17 0959   01/09/17 0600  oseltamivir (TAMIFLU) capsule 30 mg     30 mg Oral 2 times daily 01/09/17 0553 01/14/17 0959   01/09/17 0215  azithromycin (ZITHROMAX) tablet 500 mg     500 mg Oral Daily 01/09/17 0112 01/09/17 0305   01/09/17 0115  cefTRIAXone (ROCEPHIN) 1 g in dextrose 5 % 50 mL IVPB     1 g 100 mL/hr over 30 Minutes Intravenous Every 24 hours 01/09/17 0112         Objective: Vitals:   01/09/17 1939 01/09/17 2243 01/10/17 0616 01/10/17 0815  BP:  (!) 135/91 (!) 169/74   Pulse:  71 68   Resp:  18 18   Temp:  98.3 F (36.8 C) 98.7 F (37.1 C)   TempSrc:  Axillary Axillary   SpO2: 97% 99%  98% 97%    Intake/Output Summary (Last 24 hours) at 01/10/17 1535 Last data filed at 01/10/17 1518  Gross per 24 hour  Intake             2295 ml  Output              300 ml  Net             1995 ml   There were no vitals filed for this visit.  Examination: General exam: Appears comfortable  HEENT: PERRLA, oral mucosa moist, no sclera icterus or thrush Respiratory system: crackles and rhonchi bilaterally.  Respiratory effort normal. Cardiovascular system: S1 & S2 heard, RRR.  No murmurs  Gastrointestinal system: Abdomen soft, non-tender, nondistended. Normal bowel sound. No organomegaly Central nervous system: Alert and oriented. No focal neurological deficits. Extremities: No cyanosis, clubbing or edema Skin: No rashes or ulcers Psychiatry:  Mood & affect appropriate.     Data Reviewed: I have personally reviewed following labs and imaging studies  CBC:  Recent Labs Lab 01/08/17 1944 01/09/17 2320  WBC 7.0 9.3  NEUTROABS 6.1  --   HGB 12.8* 11.9*  HCT 37.6* 34.7*  MCV 85.6 85.5  PLT 145* [id number]*   Basic Metabolic Panel:  Recent Labs Lab 01/08/17 1944 01/09/17 2320  NA  137 136  K 3.9 4.1  CL 104 105  CO2 25 22  GLUCOSE 115* 184*  BUN 16 22*  CREATININE 1.13 1.11  CALCIUM 8.5* 8.0*   GFR: CrCl cannot be calculated (Unknown ideal weight.). Liver Function Tests:  Recent Labs Lab 01/08/17 1944  AST 24  ALT 16*  ALKPHOS 40  BILITOT 0.9  PROT 5.5*  ALBUMIN 3.3*   No results for input(s): LIPASE, AMYLASE in the last 168 hours. No results for input(s): AMMONIA in the last 168 hours. Coagulation Profile:  Recent Labs Lab 01/09/17 0530  INR 1.25   Cardiac Enzymes:  Recent Labs Lab 01/09/17 0140 01/09/17 1814 01/09/17 2320  TROPONINI <0.03 <0.03 <0.03   BNP (last 3 results) No results for input(s): PROBNP in the last 8760 hours. HbA1C:  Recent Labs  01/09/17 0149  HGBA1C 5.0   CBG: No results for input(s): GLUCAP in the last 168  hours. Lipid Profile:  Recent Labs  01/09/17 0149  CHOL 104  HDL 46  LDLCALC 50  TRIG 40  CHOLHDL 2.3   Thyroid Function Tests:  Recent Labs  01/09/17 0140  TSH 1.062  FREET4 0.98   Anemia Panel: No results for input(s): VITAMINB12, FOLATE, FERRITIN, TIBC, IRON, RETICCTPCT in the last 72 hours. Urine analysis:    Component Value Date/Time   COLORURINE YELLOW 01/08/2017 2110   APPEARANCEUR CLEAR 01/08/2017 2110   APPEARANCEUR Clear 10/08/2014 0848   LABSPEC 1.025 01/08/2017 2110   LABSPEC 1.009 10/08/2014 0848   PHURINE 5.0 01/08/2017 2110   GLUCOSEU NEGATIVE 01/08/2017 2110   GLUCOSEU Negative 10/08/2014 0848   HGBUR SMALL (A) 01/08/2017 2110   BILIRUBINUR NEGATIVE 01/08/2017 2110   BILIRUBINUR Negative 10/08/2014 0848   KETONESUR 5 (A) 01/08/2017 2110   PROTEINUR NEGATIVE 01/08/2017 2110   UROBILINOGEN 1.0 03/06/2013 1924   NITRITE NEGATIVE 01/08/2017 2110   LEUKOCYTESUR NEGATIVE 01/08/2017 2110   LEUKOCYTESUR Negative 10/08/2014 0848   Sepsis Labs: @LABRCNTIP (procalcitonin:4,lacticidven:4) ) Recent Results (from the past 240 hour(s))  Culture, blood (routine x 2) Call MD if unable to obtain prior to antibiotics being given     Status: None (Preliminary result)   Collection Time: 01/09/17  1:39 AM  Result Value Ref Range Status   Specimen Description BLOOD RIGHT ARM  Final   Special Requests BOTTLES DRAWN AEROBIC AND ANAEROBIC 10ML  Final   Culture NO GROWTH < 12 HOURS  Final   Report Status PENDING  Incomplete  Culture, blood (routine x 2) Call MD if unable to obtain prior to antibiotics being given     Status: None (Preliminary result)   Collection Time: 01/09/17  1:47 AM  Result Value Ref Range Status   Specimen Description BLOOD RIGHT HAND  Final   Special Requests IN PEDIATRIC BOTTLE 4ML  Final   Culture NO GROWTH < 12 HOURS  Final   Report Status PENDING  Incomplete         Radiology Studies: Dg Chest 2 View  Result Date: 01/08/2017 CLINICAL  DATA:  Shortness of breath. EXAM: CHEST  2 VIEW COMPARISON:  09/12/2016 FINDINGS: The cardiac silhouette, mediastinal and hilar contours are within normal limits and stable. There is moderate tortuosity and calcification of the thoracic aorta. No definite infiltrates or edema. Streaky areas of atelectasis and mild chronic bronchitic changes. Could not exclude small effusions. The bony thorax is intact. Stable degenerative changes involving both shoulders. IMPRESSION: Chronic bronchitic changes and streaky bibasilar atelectasis. Could not exclude small effusions. No edema or infiltrates.  Electronically Signed   By: Marijo Sanes M.D.   On: 01/08/2017 20:40      Scheduled Meds: . aspirin EC  81 mg Oral Daily  . azithromycin  250 mg Oral Daily  . cefTRIAXone (ROCEPHIN)  IV  1 g Intravenous Q24H  . cholecalciferol  1,000 Units Oral Daily  . dextromethorphan-guaiFENesin  1 tablet Oral BID  . finasteride  5 mg Oral Daily  . isosorbide mononitrate  15 mg Oral Daily  . losartan  50 mg Oral Daily  . multivitamin with minerals  1 tablet Oral Daily  . omega-3 acid ethyl esters  1 g Oral Daily  . oseltamivir  30 mg Oral BID  . pravastatin  40 mg Oral Daily  . tamsulosin  0.4 mg Oral Daily   Continuous Infusions: . sodium chloride 100 mL/hr at 01/10/17 1519     LOS: 1 day    Time spent in minutes: 79    [employer], MD [facility] Pager: [email or web address] Password [email or web address] 01/10/2017, 3:35 PM

## 2017-01-11 DIAGNOSIS — I2583 Coronary atherosclerosis due to lipid rich plaque: Secondary | ICD-10-CM

## 2017-01-11 DIAGNOSIS — I10 Essential (primary) hypertension: Secondary | ICD-10-CM

## 2017-01-11 DIAGNOSIS — I480 Paroxysmal atrial fibrillation: Secondary | ICD-10-CM

## 2017-01-11 DIAGNOSIS — I2510 Atherosclerotic heart disease of native coronary artery without angina pectoris: Secondary | ICD-10-CM

## 2017-01-11 DIAGNOSIS — E7800 Pure hypercholesterolemia, unspecified: Secondary | ICD-10-CM

## 2017-01-11 MED ORDER — AZITHROMYCIN 250 MG PO TABS: 250.0000 mg | ORAL_TABLET | Freq: Every day | ORAL | 0 refills | Status: DC

## 2017-01-11 MED ORDER — PREDNISONE 20 MG PO TABS: ORAL_TABLET | ORAL | 0 refills | Status: DC

## 2017-01-11 MED ORDER — OSELTAMIVIR PHOSPHATE 30 MG PO CAPS: 30.0000 mg | ORAL_CAPSULE | Freq: Two times a day (BID) | ORAL | 0 refills | Status: DC

## 2017-01-11 NOTE — Progress Notes (Signed)
Physical Therapy Treatment Patient Details Name: Terry Taylor MRN: [id number] DOB: 1928/12/10 Today's Date: 01/11/2017    History of Present Illness 83 y.o. male with medical history significant of hypertension, hyperlipidemia, COPD, stroke, BPH, CAD, using a walker, who presents with fever, productive cough and SOB.    PT Comments    D/C recommendations updated. Daughter present during Rx and declines any follow-up PT services. She reports pt has refused to participate on multi attempts in the past. He has all needed DME and will have needed level of assist at home from family.  Follow Up Recommendations  No PT follow up;Supervision/Assistance - 24 hour     Equipment Recommendations  None recommended by PT    Recommendations for Other Services       Precautions / Restrictions Precautions Precautions: Fall Restrictions Weight Bearing Restrictions: No    Mobility  Bed Mobility         Supine to sit: Mod assist     General bed mobility comments: continuous verbal cues for sequencing  Transfers       Sit to Stand: Max assist Stand pivot transfers: +2 physical assistance;Mod assist       General transfer comment: Pt began having BM during transfer. Multi sit to stand reps with RW for hygeine.  Ambulation/Gait             General Gait Details: unable   Stairs            Wheelchair Mobility    Modified Rankin (Stroke Patients Only)       Balance   Sitting-balance support: Feet supported;Bilateral upper extremity supported Sitting balance-Leahy Scale: Poor   Postural control: Posterior lean Standing balance support: Bilateral upper extremity supported;During functional activity Standing balance-Leahy Scale: Poor                      Cognition Arousal/Alertness: Awake/alert Behavior During Therapy: Anxious Overall Cognitive Status: History of cognitive impairments - at baseline                 General Comments:  Daughter reports intermittent confusion at home. Pt confused today. Perseverating on finding his dump truck.    Exercises      General Comments        Pertinent Vitals/Pain Pain Assessment: No/denies pain    Home Living                      Prior Function            PT Goals (current goals can now be found in the care plan section) Acute Rehab PT Goals Patient Stated Goal: go home with family PT Goal Formulation: With family Time For Goal Achievement: 01/24/17 Potential to Achieve Goals: Fair Progress towards PT goals: Progressing toward goals    Frequency    Min 3X/week      PT Plan Discharge plan needs to be updated    Co-evaluation             End of Session Equipment Utilized During Treatment: Gait belt Activity Tolerance: Patient tolerated treatment well Patient left: in chair;with chair alarm set;with call bell/phone within reach;with family/visitor present     Time: [id number] PT Time Calculation (min) (ACUTE ONLY): 23 min  Charges:  $Therapeutic Activity: 23-37 mins                    G Codes:      [name]  01/11/2017, 10:18 AM

## 2017-01-11 NOTE — Progress Notes (Signed)
Terry Taylor to be D/C'd Home per MD order.  Discussed with the patient and all questions fully answered.  VSS. IV catheter discontinued intact. Site without signs and symptoms of complications. Dressing and pressure applied.  An After Visit Summary was printed and given to the patient. Patient received prescription.  D/c education completed with patient/family including follow up instructions, medication list, d/c activities limitations if indicated, with other d/c instructions as indicated by MD - patient able to verbalize understanding, all questions fully answered.   Patient instructed to return to ED, call 911, or call MD for any changes in condition.   Patient to be escorted via [place], and D/C home via private auto.  Terry Taylor C 01/11/2017 11:00 AM

## 2017-01-11 NOTE — Discharge Summary (Signed)
Physician Discharge Summary  Terry Taylor [id number] DOB: June 20, 1928 DOA: 01/08/2017  PCP: Vista Mink, FNP  Admit date: 01/08/2017 Discharge date: 01/11/2017  Admitted From: home Disposition:  home   Home Health:  ordered Equipment/Devices:  wheelchair    Discharge Condition:  stable   CODE STATUS:  Full code   Diet recommendation:  Heart heatlhy Consultations:  none    Discharge Diagnoses:  Principal Problem:   COPD exacerbation ([place]) Active Problems:   Influenza A   Paroxysmal a-fib ([place])   HYPERCHOLESTEROLEMIA  IIA   Essential hypertension   Coronary artery disease due to lipid rich plaque   COPD (chronic obstructive pulmonary disease) ([place])    Subjective: Confused today- at baseline per daughter  Brief Summary: Terry Castles Summersis a 83 y.o.malewith medical history significant of hypertension, hyperlipidemia, COPD, stroke, BPH, CAD, using a walker, who presents with fever, productive cough and SOB. Temp 101.6, pulse ox mid 80s on room air.   Hospital Course:  Principal Problem: COPD exacerbation due to Influenza A - significantly improved with treatment - given Nebs, Tylenol  - weaned Solumedrol to Prednisone - started Tamiflu  Active Problems: HYPERCHOLESTEROLEMIA   Essential hypertension - Losartan  Coronary artery disease due to lipid rich plaque - ASA , statin  New onset atrial fibrillation  - rate is controlled without medication - CHA2DS2-VASc Score 5 - cont Aspirin not a good candidate for DOAC due to frailty and high fall risk - ECHO unrevealing- see below - TFTs WNL  Dementia with acute delirium - delirium improving- now at baseline  Discharge Instructions  Discharge Instructions    Diet - low sodium heart healthy    Complete by:  As directed    Increase activity slowly    Complete by:  As directed      Allergies as of 01/11/2017      Reactions   Bee Venom Anaphylaxis      Medication List     TAKE these medications   acetaminophen 500 MG tablet Commonly known as:  TYLENOL Take 500 mg by mouth every 6 (six) hours as needed for mild pain.   albuterol (2.5 MG/3ML) 0.083% nebulizer solution Commonly known as:  PROVENTIL Take 2.5 mg by nebulization every 6 (six) hours as needed for wheezing or shortness of breath.   aspirin EC 81 MG tablet Take 81 mg by mouth daily.   azithromycin 250 MG tablet Commonly known as:  ZITHROMAX Take 1 tablet (250 mg total) by mouth daily. Notes to patient:  ANTIBIOTIC   cholecalciferol 1000 units tablet Commonly known as:  VITAMIN D Take 1,000 Units by mouth daily.   finasteride 5 MG tablet Commonly known as:  PROSCAR Take 1 tablet (5 mg total) by mouth daily.   Fluticasone-Salmeterol 250-50 MCG/DOSE Aepb Commonly known as:  ADVAIR Inhale 1 puff into the lungs daily as needed (shortness of breath).   hydroxypropyl methylcellulose / hypromellose 2.5 % ophthalmic solution Commonly known as:  ISOPTO TEARS / GONIOVISC Place 1 drop into both eyes 4 (four) times daily as needed for dry eyes.   isosorbide mononitrate 30 MG 24 hr tablet Commonly known as:  IMDUR Take 0.5 tablets (15 mg total) by mouth daily.   losartan 50 MG tablet Commonly known as:  COZAAR Take 1 tablet (50 mg total) by mouth daily.   meloxicam 7.5 MG tablet Commonly known as:  MOBIC Take 7.5 mg by mouth daily.   multivitamin with minerals Tabs tablet Take 1 tablet by mouth daily.  omega-3 acid ethyl esters 1 g capsule Commonly known as:  LOVAZA Take 1 g by mouth daily.   oseltamivir 30 MG capsule Commonly known as:  TAMIFLU Take 1 capsule (30 mg total) by mouth 2 (two) times daily. Notes to patient:  FOR FLU   pravastatin 40 MG tablet Commonly known as:  PRAVACHOL Take 40 mg by mouth daily.   predniSONE 20 MG tablet Commonly known as:  DELTASONE 40 mg tomorrow, 20 mg the next day and 10 mg the last day   SPIRIVA HANDIHALER 18 MCG inhalation  capsule Generic drug:  tiotropium Place 18 mcg into inhaler and inhale daily.   tamsulosin 0.4 MG Caps capsule Commonly known as:  FLOMAX Take 0.4 mg by mouth daily.       Allergies  Allergen Reactions  . Bee Venom Anaphylaxis     Procedures/Studies: 2 D ECHO Left ventricle: The cavity size was normal. Wall thickness was   increased in a pattern of moderate LVH. Systolic function was   normal. The estimated ejection fraction was in the range of 60%   to 65%. Although no diagnostic regional wall motion abnormality   was identified, this possibility cannot be completely excluded on   the basis of this study. - Left atrium: The atrium was moderately dilated.   Dg Chest 2 View  Result Date: 01/08/2017 CLINICAL DATA:  Shortness of breath. EXAM: CHEST  2 VIEW COMPARISON:  09/12/2016 FINDINGS: The cardiac silhouette, mediastinal and hilar contours are within normal limits and stable. There is moderate tortuosity and calcification of the thoracic aorta. No definite infiltrates or edema. Streaky areas of atelectasis and mild chronic bronchitic changes. Could not exclude small effusions. The bony thorax is intact. Stable degenerative changes involving both shoulders. IMPRESSION: Chronic bronchitic changes and streaky bibasilar atelectasis. Could not exclude small effusions. No edema or infiltrates. Electronically Signed   By: Marijo Sanes M.D.   On: 01/08/2017 20:40       Discharge Exam: Vitals:   01/11/17 0717 01/11/17 1107  BP: (!) 184/95 (!) 149/80  Pulse: 67 60  Resp:    Temp:     Vitals:   01/10/17 1645 01/11/17 0444 01/11/17 0717 01/11/17 1107  BP: (!) 150/80 (!) 189/84 (!) 184/95 (!) 149/80  Pulse:  71 67 60  Resp:  18    Temp:  98 F (36.7 C)    TempSrc:  Oral    SpO2:  98%      General: Pt is alert, awake, not in acute distress Cardiovascular: RRR, S1/S2 +, no rubs, no gallops Respiratory: CTA bilaterally, no wheezing, no rhonchi Abdominal: Soft, NT, ND, bowel  sounds + Extremities: no edema, no cyanosis    The results of significant diagnostics from this hospitalization (including imaging, microbiology, ancillary and laboratory) are listed below for reference.     Microbiology: Recent Results (from the past 240 hour(s))  Culture, blood (routine x 2) Call MD if unable to obtain prior to antibiotics being given     Status: None (Preliminary result)   Collection Time: 01/09/17  1:39 AM  Result Value Ref Range Status   Specimen Description BLOOD RIGHT ARM  Final   Special Requests BOTTLES DRAWN AEROBIC AND ANAEROBIC 10ML  Final   Culture NO GROWTH 1 DAY  Final   Report Status PENDING  Incomplete  Culture, blood (routine x 2) Call MD if unable to obtain prior to antibiotics being given     Status: None (Preliminary result)   Collection Time:  01/09/17  1:47 AM  Result Value Ref Range Status   Specimen Description BLOOD RIGHT HAND  Final   Special Requests IN PEDIATRIC BOTTLE 4ML  Final   Culture NO GROWTH 1 DAY  Final   Report Status PENDING  Incomplete     Labs: BNP (last 3 results)  Recent Labs  01/08/17 1944  BNP 123456*   Basic Metabolic Panel:  Recent Labs Lab 01/08/17 1944 01/09/17 2320  NA 137 136  K 3.9 4.1  CL 104 105  CO2 25 22  GLUCOSE 115* 184*  BUN 16 22*  CREATININE 1.13 1.11  CALCIUM 8.5* 8.0*   Liver Function Tests:  Recent Labs Lab 01/08/17 1944  AST 24  ALT 16*  ALKPHOS 40  BILITOT 0.9  PROT 5.5*  ALBUMIN 3.3*   No results for input(s): LIPASE, AMYLASE in the last 168 hours. No results for input(s): AMMONIA in the last 168 hours. CBC:  Recent Labs Lab 01/08/17 1944 01/09/17 2320  WBC 7.0 9.3  NEUTROABS 6.1  --   HGB 12.8* 11.9*  HCT 37.6* 34.7*  MCV 85.6 85.5  PLT 145* 140*   Cardiac Enzymes:  Recent Labs Lab 01/09/17 0140 01/09/17 1814 01/09/17 2320  TROPONINI <0.03 <0.03 <0.03   BNP: Invalid input(s): POCBNP CBG: No results for input(s): GLUCAP in the last 168  hours. D-Dimer No results for input(s): DDIMER in the last 72 hours. Hgb A1c  Recent Labs  01/09/17 0149  HGBA1C 5.0   Lipid Profile  Recent Labs  01/09/17 0149  CHOL 104  HDL 46  LDLCALC 50  TRIG 40  CHOLHDL 2.3   Thyroid function studies  Recent Labs  01/09/17 0140  TSH 1.062   Anemia work up No results for input(s): VITAMINB12, FOLATE, FERRITIN, TIBC, IRON, RETICCTPCT in the last 72 hours. Urinalysis    Component Value Date/Time   COLORURINE YELLOW 01/08/2017 2110   APPEARANCEUR CLEAR 01/08/2017 2110   APPEARANCEUR Clear 10/08/2014 0848   LABSPEC 1.025 01/08/2017 2110   LABSPEC 1.009 10/08/2014 0848   PHURINE 5.0 01/08/2017 2110   GLUCOSEU NEGATIVE 01/08/2017 2110   GLUCOSEU Negative 10/08/2014 0848   HGBUR SMALL (A) 01/08/2017 2110   BILIRUBINUR NEGATIVE 01/08/2017 2110   BILIRUBINUR Negative 10/08/2014 0848   KETONESUR 5 (A) 01/08/2017 2110   PROTEINUR NEGATIVE 01/08/2017 2110   UROBILINOGEN 1.0 03/06/2013 1924   NITRITE NEGATIVE 01/08/2017 2110   LEUKOCYTESUR NEGATIVE 01/08/2017 2110   LEUKOCYTESUR Negative 10/08/2014 0848   Sepsis Labs Invalid input(s): PROCALCITONIN,  WBC,  LACTICIDVEN Microbiology Recent Results (from the past 240 hour(s))  Culture, blood (routine x 2) Call MD if unable to obtain prior to antibiotics being given     Status: None (Preliminary result)   Collection Time: 01/09/17  1:39 AM  Result Value Ref Range Status   Specimen Description BLOOD RIGHT ARM  Final   Special Requests BOTTLES DRAWN AEROBIC AND ANAEROBIC 10ML  Final   Culture NO GROWTH 1 DAY  Final   Report Status PENDING  Incomplete  Culture, blood (routine x 2) Call MD if unable to obtain prior to antibiotics being given     Status: None (Preliminary result)   Collection Time: 01/09/17  1:47 AM  Result Value Ref Range Status   Specimen Description BLOOD RIGHT HAND  Final   Special Requests IN PEDIATRIC BOTTLE 4ML  Final   Culture NO GROWTH 1 DAY  Final   Report  Status PENDING  Incomplete     Time coordinating  discharge: Over 30 minutes  SIGNED:   Debbe Odea, MD  [facility] 01/11/2017, 11:56 AM Pager   If 7PM-7AM, please contact night-coverage [email or web address] Password [email or web address]

## 2017-01-11 NOTE — Progress Notes (Signed)
Patient refused to take tamiflu and mucinex earlier. After about 20 minutes and the daughter coaxing, he finally took them.

## 2017-01-11 NOTE — Care Management Note (Addendum)
Case Management Note  Patient Details  Name: Terry Taylor MRN: [id number] Date of Birth: Mar 08, 1928  Subjective/Objective:            Spoke with patient's son Terry Taylor [phone] at the bedside. He denies needs for DME, patient Patient active with [facility] recently but need F2F. Will resume with PT RN after discharge.   Referral made to Sharmon Revere, clinical liaison. Notified that son requested to point of contact at number above and that patient was +Flu on admission.      Action/Plan:  DC to home with HH. Text paged Dr Wynelle Cleveland for RN PT order with F2F.    Expected Discharge Date:  01/11/17               Expected Discharge Plan:  [place]  In-House Referral:     Discharge planning Services  CM Consult  Post Acute Care Choice:  Home Health Choice offered to:  Adult Children  DME Arranged:  N/A DME Agency:     HH Arranged:  RN, PT HH Agency:  [facility]  Status of Service:  Completed, signed off  If discussed at [place] of Stay Meetings, dates discussed:    Additional Comments:  Carles Collet, RN 01/11/2017, 10:44 AM

## 2017-01-14 DIAGNOSIS — F0390 Unspecified dementia without behavioral disturbance: Secondary | ICD-10-CM | POA: Diagnosis not present

## 2017-01-14 DIAGNOSIS — I480 Paroxysmal atrial fibrillation: Secondary | ICD-10-CM | POA: Diagnosis not present

## 2017-01-14 DIAGNOSIS — J449 Chronic obstructive pulmonary disease, unspecified: Secondary | ICD-10-CM | POA: Diagnosis not present

## 2017-01-14 DIAGNOSIS — I2510 Atherosclerotic heart disease of native coronary artery without angina pectoris: Secondary | ICD-10-CM | POA: Diagnosis not present

## 2017-01-14 DIAGNOSIS — E785 Hyperlipidemia, unspecified: Secondary | ICD-10-CM | POA: Diagnosis not present

## 2017-01-14 DIAGNOSIS — J441 Chronic obstructive pulmonary disease with (acute) exacerbation: Secondary | ICD-10-CM | POA: Diagnosis not present

## 2017-01-14 DIAGNOSIS — I10 Essential (primary) hypertension: Secondary | ICD-10-CM | POA: Diagnosis not present

## 2017-01-14 LAB — CULTURE, BLOOD (ROUTINE X 2)
Culture: NO GROWTH
Culture: NO GROWTH

## 2017-01-16 DIAGNOSIS — R296 Repeated falls: Secondary | ICD-10-CM | POA: Diagnosis not present

## 2017-01-16 DIAGNOSIS — F05 Delirium due to known physiological condition: Secondary | ICD-10-CM | POA: Diagnosis not present

## 2017-01-16 DIAGNOSIS — M129 Arthropathy, unspecified: Secondary | ICD-10-CM | POA: Diagnosis not present

## 2017-01-16 DIAGNOSIS — F0390 Unspecified dementia without behavioral disturbance: Secondary | ICD-10-CM | POA: Diagnosis not present

## 2017-01-16 DIAGNOSIS — R2681 Unsteadiness on feet: Secondary | ICD-10-CM | POA: Diagnosis not present

## 2017-01-16 DIAGNOSIS — J449 Chronic obstructive pulmonary disease, unspecified: Secondary | ICD-10-CM | POA: Diagnosis not present

## 2017-01-18 DIAGNOSIS — I480 Paroxysmal atrial fibrillation: Secondary | ICD-10-CM | POA: Diagnosis not present

## 2017-01-18 DIAGNOSIS — J449 Chronic obstructive pulmonary disease, unspecified: Secondary | ICD-10-CM | POA: Diagnosis not present

## 2017-01-18 DIAGNOSIS — J441 Chronic obstructive pulmonary disease with (acute) exacerbation: Secondary | ICD-10-CM | POA: Diagnosis not present

## 2017-01-18 DIAGNOSIS — E785 Hyperlipidemia, unspecified: Secondary | ICD-10-CM | POA: Diagnosis not present

## 2017-01-18 DIAGNOSIS — I2510 Atherosclerotic heart disease of native coronary artery without angina pectoris: Secondary | ICD-10-CM | POA: Diagnosis not present

## 2017-01-18 DIAGNOSIS — I10 Essential (primary) hypertension: Secondary | ICD-10-CM | POA: Diagnosis not present

## 2017-01-18 DIAGNOSIS — F0390 Unspecified dementia without behavioral disturbance: Secondary | ICD-10-CM | POA: Diagnosis not present

## 2017-01-21 DIAGNOSIS — F0390 Unspecified dementia without behavioral disturbance: Secondary | ICD-10-CM | POA: Diagnosis not present

## 2017-01-21 DIAGNOSIS — I10 Essential (primary) hypertension: Secondary | ICD-10-CM | POA: Diagnosis not present

## 2017-01-21 DIAGNOSIS — J441 Chronic obstructive pulmonary disease with (acute) exacerbation: Secondary | ICD-10-CM | POA: Diagnosis not present

## 2017-01-21 DIAGNOSIS — I2510 Atherosclerotic heart disease of native coronary artery without angina pectoris: Secondary | ICD-10-CM | POA: Diagnosis not present

## 2017-01-21 DIAGNOSIS — E785 Hyperlipidemia, unspecified: Secondary | ICD-10-CM | POA: Diagnosis not present

## 2017-01-21 DIAGNOSIS — I480 Paroxysmal atrial fibrillation: Secondary | ICD-10-CM | POA: Diagnosis not present

## 2017-01-21 DIAGNOSIS — J449 Chronic obstructive pulmonary disease, unspecified: Secondary | ICD-10-CM | POA: Diagnosis not present

## 2017-01-24 DIAGNOSIS — F0390 Unspecified dementia without behavioral disturbance: Secondary | ICD-10-CM | POA: Diagnosis not present

## 2017-01-24 DIAGNOSIS — I2510 Atherosclerotic heart disease of native coronary artery without angina pectoris: Secondary | ICD-10-CM | POA: Diagnosis not present

## 2017-01-24 DIAGNOSIS — E785 Hyperlipidemia, unspecified: Secondary | ICD-10-CM | POA: Diagnosis not present

## 2017-01-24 DIAGNOSIS — J441 Chronic obstructive pulmonary disease with (acute) exacerbation: Secondary | ICD-10-CM | POA: Diagnosis not present

## 2017-01-24 DIAGNOSIS — J449 Chronic obstructive pulmonary disease, unspecified: Secondary | ICD-10-CM | POA: Diagnosis not present

## 2017-01-24 DIAGNOSIS — I10 Essential (primary) hypertension: Secondary | ICD-10-CM | POA: Diagnosis not present

## 2017-01-24 DIAGNOSIS — I480 Paroxysmal atrial fibrillation: Secondary | ICD-10-CM | POA: Diagnosis not present

## 2017-01-29 DIAGNOSIS — J441 Chronic obstructive pulmonary disease with (acute) exacerbation: Secondary | ICD-10-CM | POA: Diagnosis not present

## 2017-01-29 DIAGNOSIS — I480 Paroxysmal atrial fibrillation: Secondary | ICD-10-CM | POA: Diagnosis not present

## 2017-01-29 DIAGNOSIS — E785 Hyperlipidemia, unspecified: Secondary | ICD-10-CM | POA: Diagnosis not present

## 2017-01-29 DIAGNOSIS — I2510 Atherosclerotic heart disease of native coronary artery without angina pectoris: Secondary | ICD-10-CM | POA: Diagnosis not present

## 2017-01-29 DIAGNOSIS — J449 Chronic obstructive pulmonary disease, unspecified: Secondary | ICD-10-CM | POA: Diagnosis not present

## 2017-01-29 DIAGNOSIS — F0390 Unspecified dementia without behavioral disturbance: Secondary | ICD-10-CM | POA: Diagnosis not present

## 2017-01-29 DIAGNOSIS — I10 Essential (primary) hypertension: Secondary | ICD-10-CM | POA: Diagnosis not present

## 2017-02-02 DIAGNOSIS — I480 Paroxysmal atrial fibrillation: Secondary | ICD-10-CM | POA: Diagnosis not present

## 2017-02-02 DIAGNOSIS — J449 Chronic obstructive pulmonary disease, unspecified: Secondary | ICD-10-CM | POA: Diagnosis not present

## 2017-02-02 DIAGNOSIS — I10 Essential (primary) hypertension: Secondary | ICD-10-CM | POA: Diagnosis not present

## 2017-02-02 DIAGNOSIS — J441 Chronic obstructive pulmonary disease with (acute) exacerbation: Secondary | ICD-10-CM | POA: Diagnosis not present

## 2017-02-02 DIAGNOSIS — E785 Hyperlipidemia, unspecified: Secondary | ICD-10-CM | POA: Diagnosis not present

## 2017-02-02 DIAGNOSIS — F0390 Unspecified dementia without behavioral disturbance: Secondary | ICD-10-CM | POA: Diagnosis not present

## 2017-02-02 DIAGNOSIS — I2510 Atherosclerotic heart disease of native coronary artery without angina pectoris: Secondary | ICD-10-CM | POA: Diagnosis not present

## 2017-02-05 DIAGNOSIS — E785 Hyperlipidemia, unspecified: Secondary | ICD-10-CM | POA: Diagnosis not present

## 2017-02-05 DIAGNOSIS — I480 Paroxysmal atrial fibrillation: Secondary | ICD-10-CM | POA: Diagnosis not present

## 2017-02-05 DIAGNOSIS — I2510 Atherosclerotic heart disease of native coronary artery without angina pectoris: Secondary | ICD-10-CM | POA: Diagnosis not present

## 2017-02-05 DIAGNOSIS — J441 Chronic obstructive pulmonary disease with (acute) exacerbation: Secondary | ICD-10-CM | POA: Diagnosis not present

## 2017-02-05 DIAGNOSIS — J449 Chronic obstructive pulmonary disease, unspecified: Secondary | ICD-10-CM | POA: Diagnosis not present

## 2017-02-05 DIAGNOSIS — F0390 Unspecified dementia without behavioral disturbance: Secondary | ICD-10-CM | POA: Diagnosis not present

## 2017-02-05 DIAGNOSIS — I10 Essential (primary) hypertension: Secondary | ICD-10-CM | POA: Diagnosis not present

## 2017-02-12 DIAGNOSIS — I10 Essential (primary) hypertension: Secondary | ICD-10-CM | POA: Diagnosis not present

## 2017-02-12 DIAGNOSIS — J449 Chronic obstructive pulmonary disease, unspecified: Secondary | ICD-10-CM | POA: Diagnosis not present

## 2017-02-12 DIAGNOSIS — I2510 Atherosclerotic heart disease of native coronary artery without angina pectoris: Secondary | ICD-10-CM | POA: Diagnosis not present

## 2017-02-12 DIAGNOSIS — F0390 Unspecified dementia without behavioral disturbance: Secondary | ICD-10-CM | POA: Diagnosis not present

## 2017-02-12 DIAGNOSIS — I480 Paroxysmal atrial fibrillation: Secondary | ICD-10-CM | POA: Diagnosis not present

## 2017-02-12 DIAGNOSIS — J441 Chronic obstructive pulmonary disease with (acute) exacerbation: Secondary | ICD-10-CM | POA: Diagnosis not present

## 2017-02-12 DIAGNOSIS — E785 Hyperlipidemia, unspecified: Secondary | ICD-10-CM | POA: Diagnosis not present

## 2017-02-16 DIAGNOSIS — J449 Chronic obstructive pulmonary disease, unspecified: Secondary | ICD-10-CM | POA: Diagnosis not present

## 2017-02-16 DIAGNOSIS — R2681 Unsteadiness on feet: Secondary | ICD-10-CM | POA: Diagnosis not present

## 2017-02-16 DIAGNOSIS — M129 Arthropathy, unspecified: Secondary | ICD-10-CM | POA: Diagnosis not present

## 2017-02-16 DIAGNOSIS — F0390 Unspecified dementia without behavioral disturbance: Secondary | ICD-10-CM | POA: Diagnosis not present

## 2017-02-16 DIAGNOSIS — F05 Delirium due to known physiological condition: Secondary | ICD-10-CM | POA: Diagnosis not present

## 2017-02-16 DIAGNOSIS — R296 Repeated falls: Secondary | ICD-10-CM | POA: Diagnosis not present

## 2017-03-06 DIAGNOSIS — E039 Hypothyroidism, unspecified: Secondary | ICD-10-CM | POA: Diagnosis not present

## 2017-03-06 DIAGNOSIS — R5383 Other fatigue: Secondary | ICD-10-CM | POA: Diagnosis not present

## 2017-03-06 DIAGNOSIS — E559 Vitamin D deficiency, unspecified: Secondary | ICD-10-CM | POA: Diagnosis not present

## 2017-03-06 DIAGNOSIS — R609 Edema, unspecified: Secondary | ICD-10-CM | POA: Diagnosis not present

## 2017-03-06 DIAGNOSIS — E7800 Pure hypercholesterolemia, unspecified: Secondary | ICD-10-CM | POA: Diagnosis not present

## 2017-03-16 DIAGNOSIS — R2681 Unsteadiness on feet: Secondary | ICD-10-CM | POA: Diagnosis not present

## 2017-03-16 DIAGNOSIS — R296 Repeated falls: Secondary | ICD-10-CM | POA: Diagnosis not present

## 2017-03-16 DIAGNOSIS — M129 Arthropathy, unspecified: Secondary | ICD-10-CM | POA: Diagnosis not present

## 2017-03-16 DIAGNOSIS — J449 Chronic obstructive pulmonary disease, unspecified: Secondary | ICD-10-CM | POA: Diagnosis not present

## 2017-03-16 DIAGNOSIS — F0390 Unspecified dementia without behavioral disturbance: Secondary | ICD-10-CM | POA: Diagnosis not present

## 2017-03-16 DIAGNOSIS — F05 Delirium due to known physiological condition: Secondary | ICD-10-CM | POA: Diagnosis not present

## 2017-04-10 DIAGNOSIS — M170 Bilateral primary osteoarthritis of knee: Secondary | ICD-10-CM | POA: Diagnosis not present

## 2017-04-10 DIAGNOSIS — M25562 Pain in left knee: Secondary | ICD-10-CM | POA: Diagnosis not present

## 2017-04-14 IMAGING — CR DG CHEST 1V PORT
1 series · 1 of 1 positions shown · non-contrast
Comparison: Radiograph October 06, 2014.

CLINICAL DATA: Shortness of breath.

EXAM:
PORTABLE CHEST 1 VIEW

[AP]
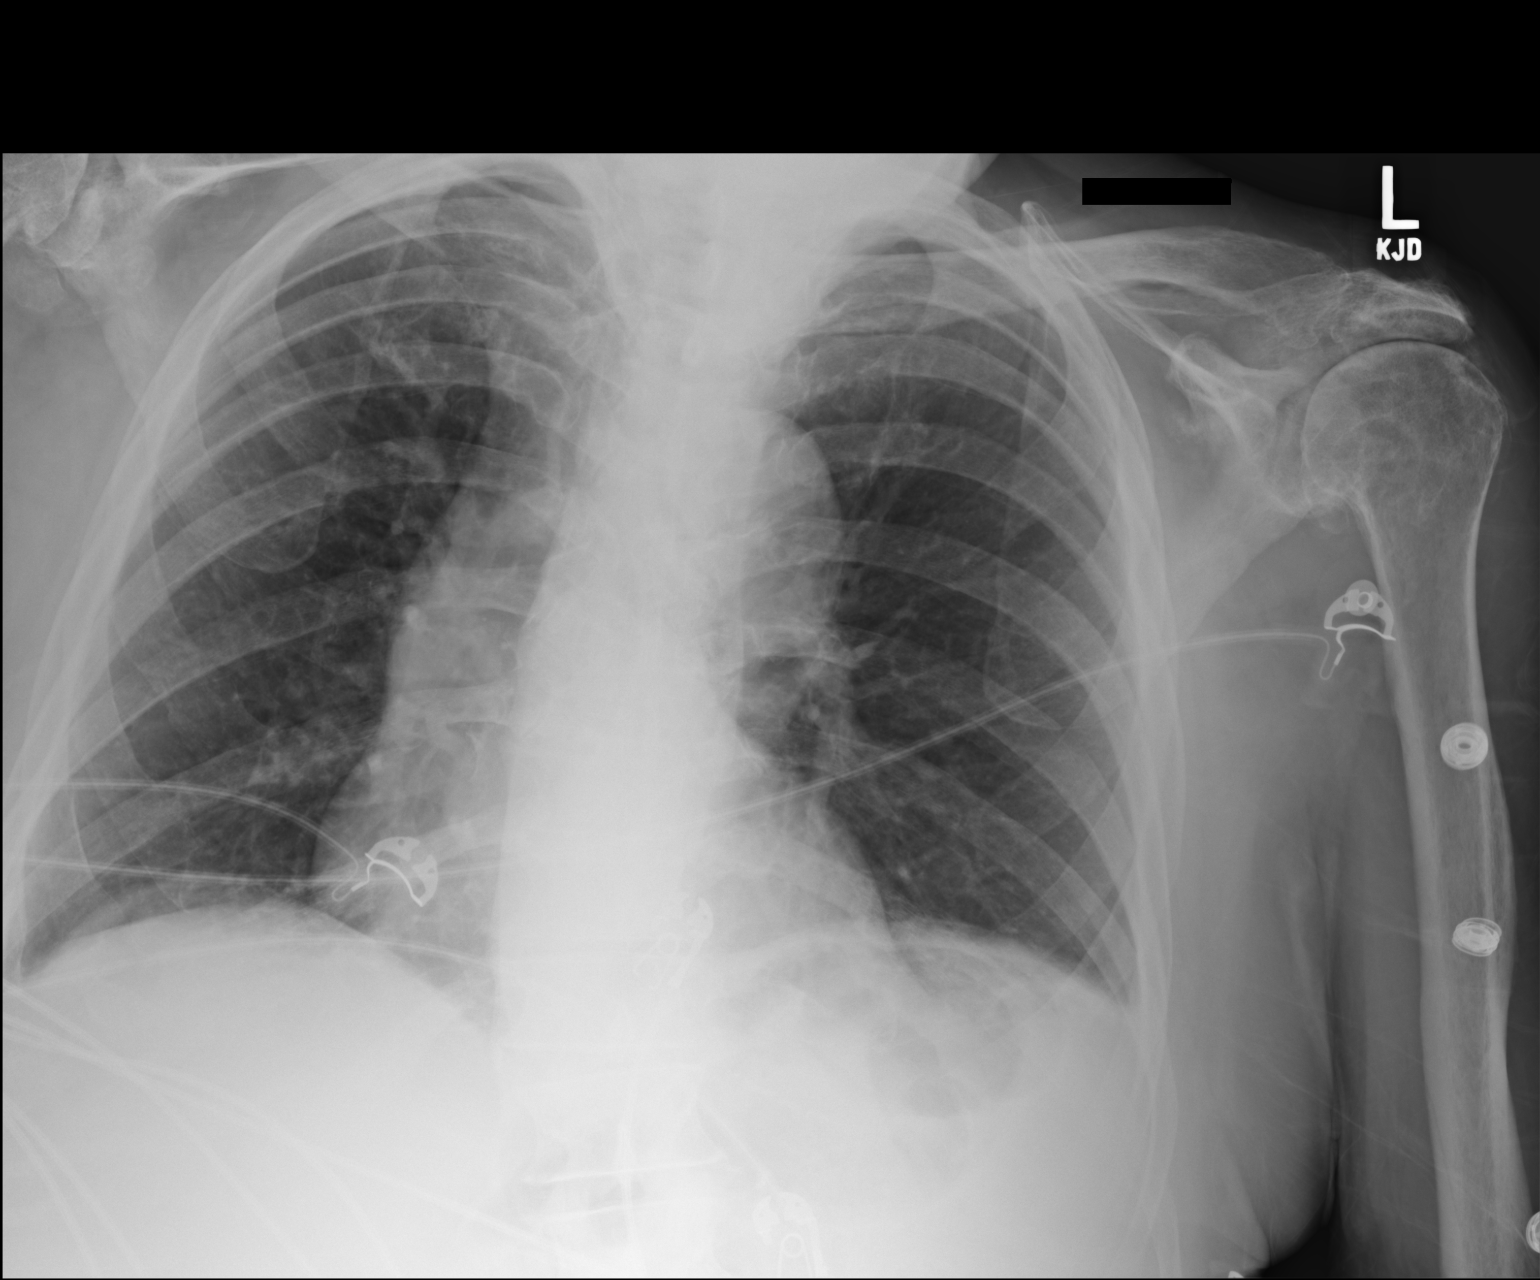

[1 of 1 positions shown; findings below may reference images not displayed]

FINDINGS: The heart size and mediastinal contours are within normal limits.
Both lungs are clear. No pneumothorax or pleural effusion is noted.
Degenerative changes seen involving both glenohumeral joints.
IMPRESSION: No acute cardiopulmonary abnormality seen.

## 2017-04-14 IMAGING — CT CT HEAD W/O CM
3 of 4 series · 15 of 47 positions shown, 18 images · non-contrast
Comparison: MRI October 08, 2014.  CT scan March 06, 2013.

CLINICAL DATA: Headache.

EXAM:
CT HEAD WITHOUT CONTRAST
TECHNIQUE: Contiguous axial images were obtained from the base of the skull
through the vertex without intravenous contrast.

[Series 3: head 2.0 h70h · axial · 0.45mm/px · z∈[-59,+69]mm · 9 of 82 slices shown, 12 images]
[im 9/82  brain]
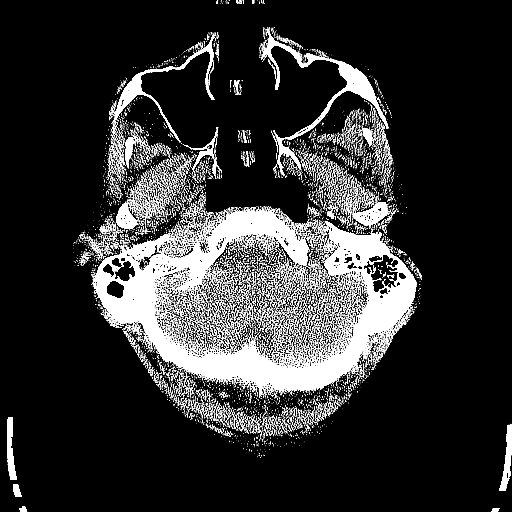
[im 9/82  bone]
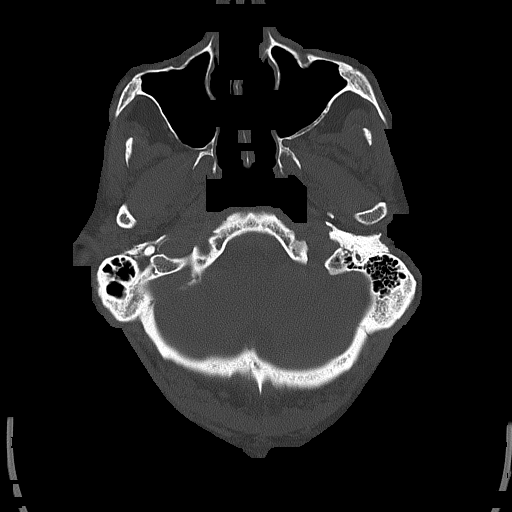
[im 17/82  brain]
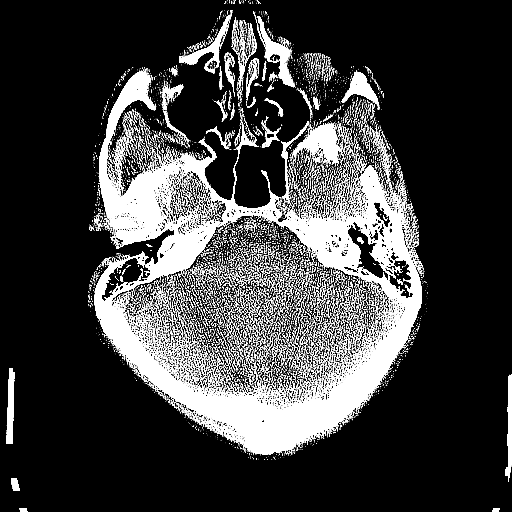
[im 25/82  brain]
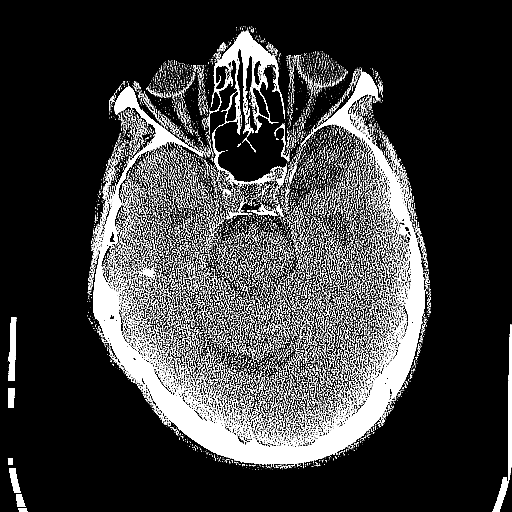
[im 33/82  brain]
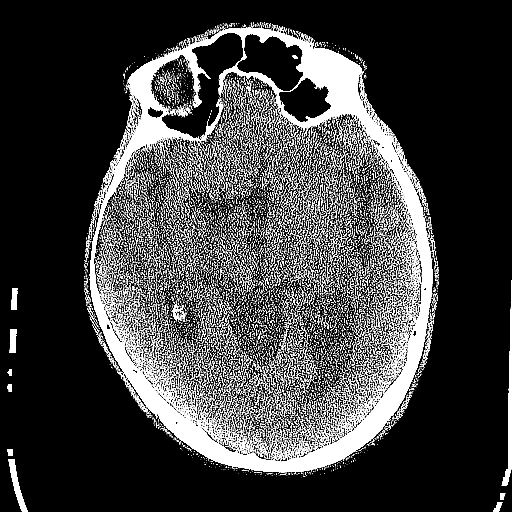
[im 41/82  brain]
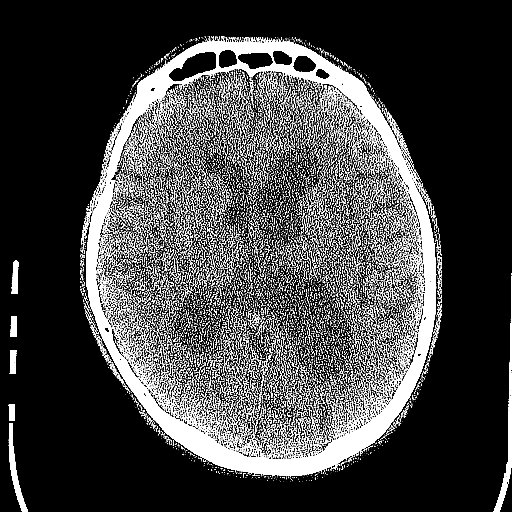
[im 41/82  bone]
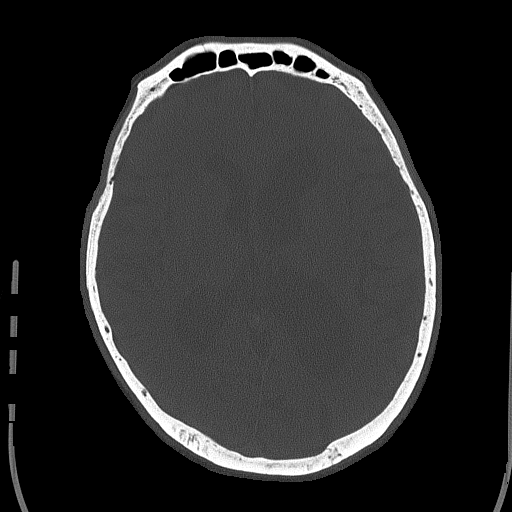
[im 49/82  brain]
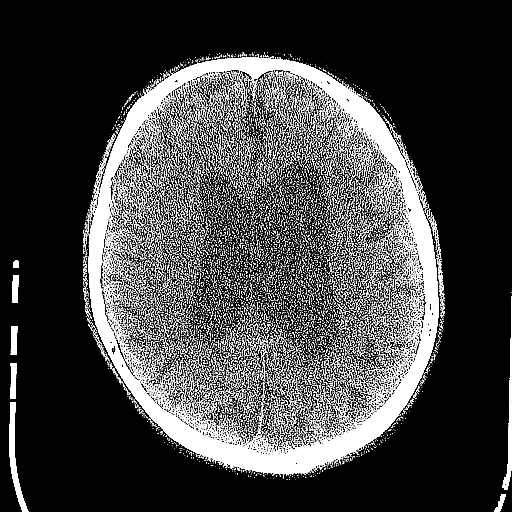
[im 57/82  brain]
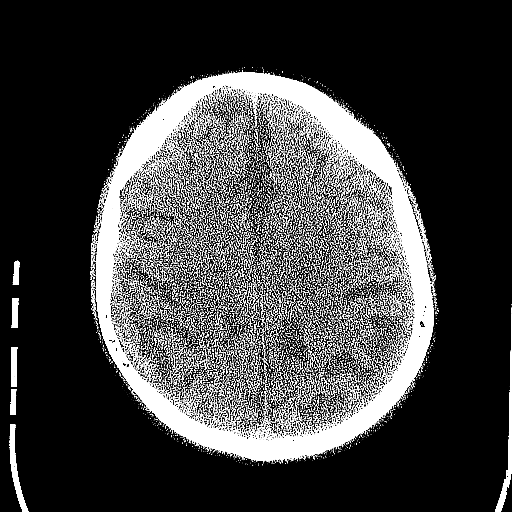
[im 65/82  brain]
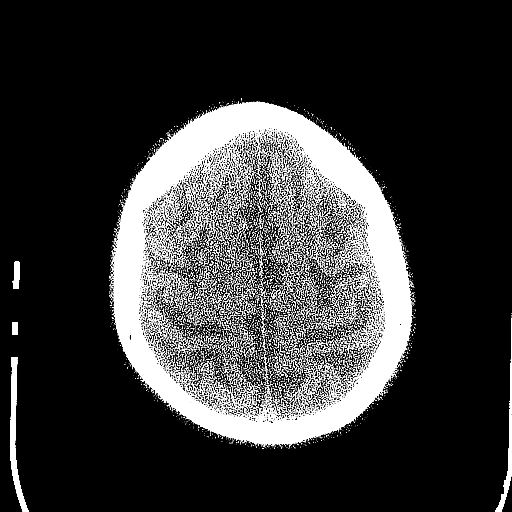
[im 73/82  brain]
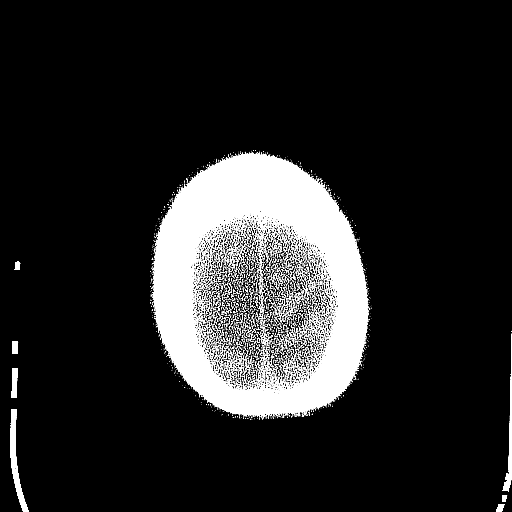
[im 73/82  bone]
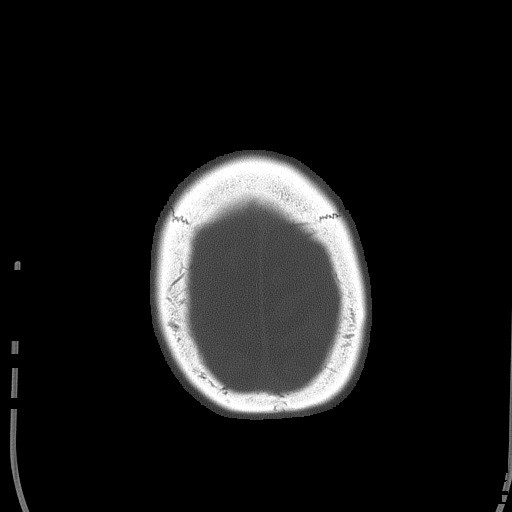

[Series 4: head 3.0 mpr cor · coronal · 0.34mm/px · 3 of 69 slices shown]
[im 23/69  brain]
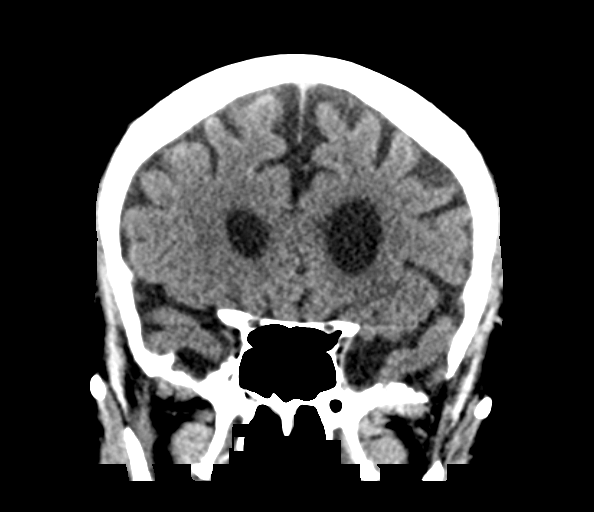
[im 31/69  brain]
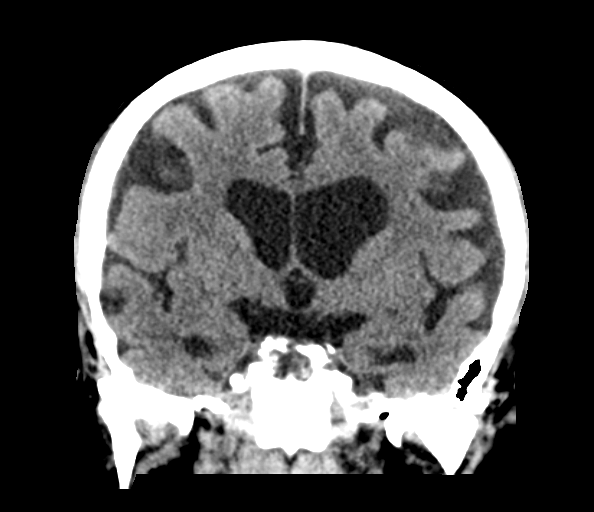
[im 38/69  brain]
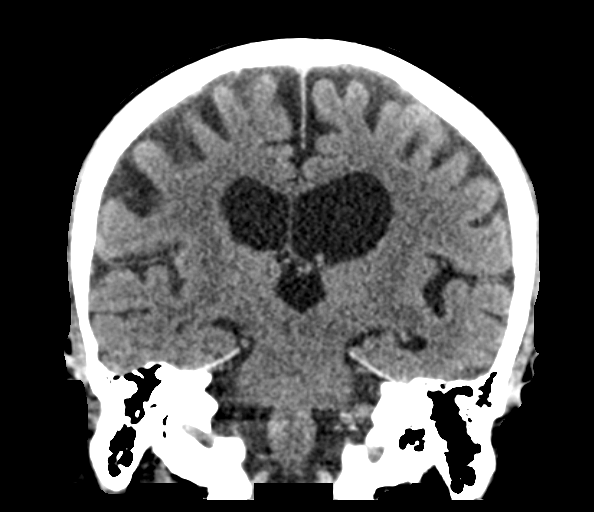

[Series 5: head 3.0 mpr sag · sagittal · 0.34mm/px · 3 of 60 slices shown]
[im 20/60  brain]
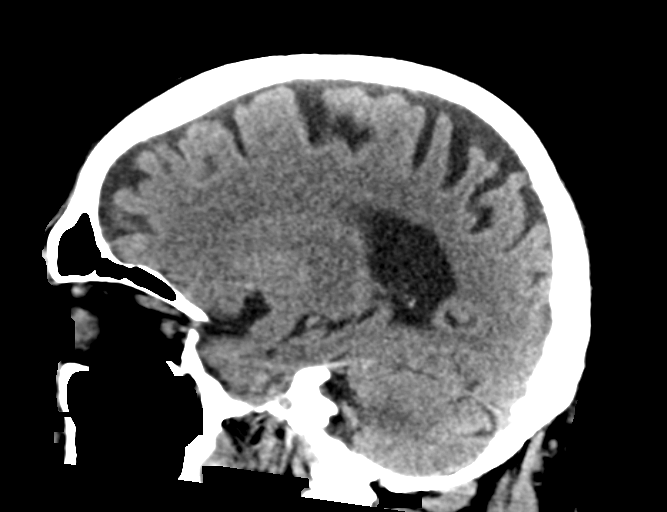
[im 30/60  brain]
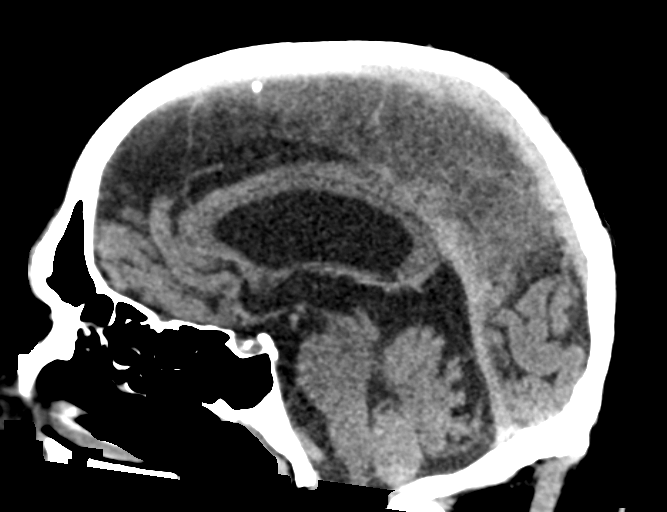
[im 40/60  brain]
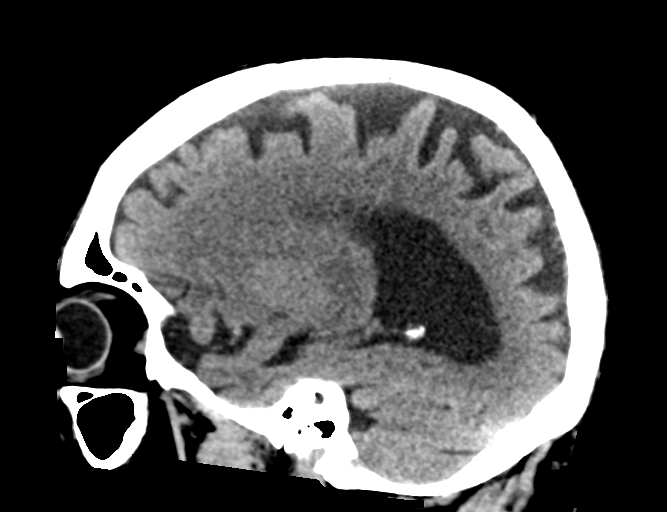

[15 of 47 positions shown; findings below may reference images not displayed]

FINDINGS: Bony calvarium appears intact. Visualized paranasal sinuses appear
normal. Moderate diffuse cortical atrophy is noted. No mass effect
or midline shift is noted. Stable ventricular enlargement is noted
most likely due to surrounding atrophy. There is no evidence of mass
lesion, hemorrhage or acute infarction.
IMPRESSION: Moderate diffuse cortical atrophy. No acute intracranial abnormality
seen.

## 2017-04-16 DIAGNOSIS — R2681 Unsteadiness on feet: Secondary | ICD-10-CM | POA: Diagnosis not present

## 2017-04-16 DIAGNOSIS — M129 Arthropathy, unspecified: Secondary | ICD-10-CM | POA: Diagnosis not present

## 2017-04-16 DIAGNOSIS — J449 Chronic obstructive pulmonary disease, unspecified: Secondary | ICD-10-CM | POA: Diagnosis not present

## 2017-04-16 DIAGNOSIS — F05 Delirium due to known physiological condition: Secondary | ICD-10-CM | POA: Diagnosis not present

## 2017-04-16 DIAGNOSIS — F0390 Unspecified dementia without behavioral disturbance: Secondary | ICD-10-CM | POA: Diagnosis not present

## 2017-04-16 DIAGNOSIS — R296 Repeated falls: Secondary | ICD-10-CM | POA: Diagnosis not present

## 2017-04-25 DIAGNOSIS — Z131 Encounter for screening for diabetes mellitus: Secondary | ICD-10-CM | POA: Diagnosis not present

## 2017-04-25 DIAGNOSIS — I10 Essential (primary) hypertension: Secondary | ICD-10-CM | POA: Diagnosis not present

## 2017-04-25 DIAGNOSIS — N390 Urinary tract infection, site not specified: Secondary | ICD-10-CM | POA: Diagnosis not present

## 2017-04-25 DIAGNOSIS — F0390 Unspecified dementia without behavioral disturbance: Secondary | ICD-10-CM | POA: Diagnosis not present

## 2017-04-25 DIAGNOSIS — R443 Hallucinations, unspecified: Secondary | ICD-10-CM | POA: Diagnosis not present

## 2017-04-25 DIAGNOSIS — R419 Unspecified symptoms and signs involving cognitive functions and awareness: Secondary | ICD-10-CM | POA: Diagnosis not present

## 2017-04-25 DIAGNOSIS — Z1329 Encounter for screening for other suspected endocrine disorder: Secondary | ICD-10-CM | POA: Diagnosis not present

## 2017-04-25 DIAGNOSIS — Z1388 Encounter for screening for disorder due to exposure to contaminants: Secondary | ICD-10-CM | POA: Diagnosis not present

## 2017-04-25 DIAGNOSIS — R410 Disorientation, unspecified: Secondary | ICD-10-CM | POA: Diagnosis not present

## 2017-04-25 DIAGNOSIS — E039 Hypothyroidism, unspecified: Secondary | ICD-10-CM | POA: Diagnosis not present

## 2017-05-16 DIAGNOSIS — J449 Chronic obstructive pulmonary disease, unspecified: Secondary | ICD-10-CM | POA: Diagnosis not present

## 2017-05-16 DIAGNOSIS — F0390 Unspecified dementia without behavioral disturbance: Secondary | ICD-10-CM | POA: Diagnosis not present

## 2017-05-16 DIAGNOSIS — R296 Repeated falls: Secondary | ICD-10-CM | POA: Diagnosis not present

## 2017-05-16 DIAGNOSIS — R2681 Unsteadiness on feet: Secondary | ICD-10-CM | POA: Diagnosis not present

## 2017-05-16 DIAGNOSIS — F05 Delirium due to known physiological condition: Secondary | ICD-10-CM | POA: Diagnosis not present

## 2017-05-16 DIAGNOSIS — M129 Arthropathy, unspecified: Secondary | ICD-10-CM | POA: Diagnosis not present

## 2017-05-30 DIAGNOSIS — I25118 Atherosclerotic heart disease of native coronary artery with other forms of angina pectoris: Secondary | ICD-10-CM | POA: Diagnosis not present

## 2017-05-30 DIAGNOSIS — E119 Type 2 diabetes mellitus without complications: Secondary | ICD-10-CM | POA: Diagnosis not present

## 2017-05-30 DIAGNOSIS — R350 Frequency of micturition: Secondary | ICD-10-CM | POA: Diagnosis not present

## 2017-05-30 DIAGNOSIS — I10 Essential (primary) hypertension: Secondary | ICD-10-CM | POA: Diagnosis not present

## 2017-05-30 DIAGNOSIS — R413 Other amnesia: Secondary | ICD-10-CM | POA: Diagnosis not present

## 2017-05-30 DIAGNOSIS — Z0000 Encounter for general adult medical examination without abnormal findings: Secondary | ICD-10-CM | POA: Diagnosis not present

## 2017-06-16 DIAGNOSIS — F05 Delirium due to known physiological condition: Secondary | ICD-10-CM | POA: Diagnosis not present

## 2017-06-16 DIAGNOSIS — M129 Arthropathy, unspecified: Secondary | ICD-10-CM | POA: Diagnosis not present

## 2017-06-16 DIAGNOSIS — F0390 Unspecified dementia without behavioral disturbance: Secondary | ICD-10-CM | POA: Diagnosis not present

## 2017-06-16 DIAGNOSIS — R2681 Unsteadiness on feet: Secondary | ICD-10-CM | POA: Diagnosis not present

## 2017-06-16 DIAGNOSIS — J449 Chronic obstructive pulmonary disease, unspecified: Secondary | ICD-10-CM | POA: Diagnosis not present

## 2017-06-16 DIAGNOSIS — R296 Repeated falls: Secondary | ICD-10-CM | POA: Diagnosis not present

## 2017-06-25 DIAGNOSIS — I25118 Atherosclerotic heart disease of native coronary artery with other forms of angina pectoris: Secondary | ICD-10-CM | POA: Diagnosis not present

## 2017-06-25 DIAGNOSIS — R413 Other amnesia: Secondary | ICD-10-CM | POA: Diagnosis not present

## 2017-06-25 DIAGNOSIS — G20 Parkinson's disease: Secondary | ICD-10-CM | POA: Diagnosis not present

## 2017-07-16 DIAGNOSIS — R2681 Unsteadiness on feet: Secondary | ICD-10-CM | POA: Diagnosis not present

## 2017-07-16 DIAGNOSIS — F0390 Unspecified dementia without behavioral disturbance: Secondary | ICD-10-CM | POA: Diagnosis not present

## 2017-07-16 DIAGNOSIS — F05 Delirium due to known physiological condition: Secondary | ICD-10-CM | POA: Diagnosis not present

## 2017-07-16 DIAGNOSIS — J449 Chronic obstructive pulmonary disease, unspecified: Secondary | ICD-10-CM | POA: Diagnosis not present

## 2017-07-16 DIAGNOSIS — R296 Repeated falls: Secondary | ICD-10-CM | POA: Diagnosis not present

## 2017-07-16 DIAGNOSIS — M129 Arthropathy, unspecified: Secondary | ICD-10-CM | POA: Diagnosis not present

## 2017-07-18 DIAGNOSIS — G20 Parkinson's disease: Secondary | ICD-10-CM | POA: Diagnosis not present

## 2017-07-18 DIAGNOSIS — F0281 Dementia in other diseases classified elsewhere with behavioral disturbance: Secondary | ICD-10-CM | POA: Diagnosis not present

## 2017-08-16 DIAGNOSIS — J449 Chronic obstructive pulmonary disease, unspecified: Secondary | ICD-10-CM | POA: Diagnosis not present

## 2017-08-16 DIAGNOSIS — R296 Repeated falls: Secondary | ICD-10-CM | POA: Diagnosis not present

## 2017-08-16 DIAGNOSIS — F05 Delirium due to known physiological condition: Secondary | ICD-10-CM | POA: Diagnosis not present

## 2017-08-16 DIAGNOSIS — R2681 Unsteadiness on feet: Secondary | ICD-10-CM | POA: Diagnosis not present

## 2017-08-16 DIAGNOSIS — M129 Arthropathy, unspecified: Secondary | ICD-10-CM | POA: Diagnosis not present

## 2017-08-16 DIAGNOSIS — F0390 Unspecified dementia without behavioral disturbance: Secondary | ICD-10-CM | POA: Diagnosis not present

## 2017-08-23 DIAGNOSIS — G20 Parkinson's disease: Secondary | ICD-10-CM | POA: Diagnosis not present

## 2017-08-23 DIAGNOSIS — F0281 Dementia in other diseases classified elsewhere with behavioral disturbance: Secondary | ICD-10-CM | POA: Diagnosis not present

## 2017-08-28 DIAGNOSIS — I480 Paroxysmal atrial fibrillation: Secondary | ICD-10-CM | POA: Diagnosis not present

## 2017-08-28 DIAGNOSIS — G20 Parkinson's disease: Secondary | ICD-10-CM | POA: Diagnosis not present

## 2017-08-28 DIAGNOSIS — E119 Type 2 diabetes mellitus without complications: Secondary | ICD-10-CM | POA: Diagnosis not present

## 2017-08-28 DIAGNOSIS — F0281 Dementia in other diseases classified elsewhere with behavioral disturbance: Secondary | ICD-10-CM | POA: Diagnosis not present

## 2017-08-28 DIAGNOSIS — E785 Hyperlipidemia, unspecified: Secondary | ICD-10-CM | POA: Diagnosis not present

## 2017-08-28 DIAGNOSIS — H9193 Unspecified hearing loss, bilateral: Secondary | ICD-10-CM | POA: Diagnosis not present

## 2017-08-28 DIAGNOSIS — J449 Chronic obstructive pulmonary disease, unspecified: Secondary | ICD-10-CM | POA: Diagnosis not present

## 2017-08-28 DIAGNOSIS — I2510 Atherosclerotic heart disease of native coronary artery without angina pectoris: Secondary | ICD-10-CM | POA: Diagnosis not present

## 2017-08-28 DIAGNOSIS — I10 Essential (primary) hypertension: Secondary | ICD-10-CM | POA: Diagnosis not present

## 2017-08-30 DIAGNOSIS — E119 Type 2 diabetes mellitus without complications: Secondary | ICD-10-CM | POA: Diagnosis not present

## 2017-08-30 DIAGNOSIS — J449 Chronic obstructive pulmonary disease, unspecified: Secondary | ICD-10-CM | POA: Diagnosis not present

## 2017-08-30 DIAGNOSIS — H9193 Unspecified hearing loss, bilateral: Secondary | ICD-10-CM | POA: Diagnosis not present

## 2017-08-30 DIAGNOSIS — E785 Hyperlipidemia, unspecified: Secondary | ICD-10-CM | POA: Diagnosis not present

## 2017-08-30 DIAGNOSIS — F0281 Dementia in other diseases classified elsewhere with behavioral disturbance: Secondary | ICD-10-CM | POA: Diagnosis not present

## 2017-08-30 DIAGNOSIS — I480 Paroxysmal atrial fibrillation: Secondary | ICD-10-CM | POA: Diagnosis not present

## 2017-08-30 DIAGNOSIS — I2510 Atherosclerotic heart disease of native coronary artery without angina pectoris: Secondary | ICD-10-CM | POA: Diagnosis not present

## 2017-08-30 DIAGNOSIS — I10 Essential (primary) hypertension: Secondary | ICD-10-CM | POA: Diagnosis not present

## 2017-08-30 DIAGNOSIS — G20 Parkinson's disease: Secondary | ICD-10-CM | POA: Diagnosis not present

## 2017-09-03 DIAGNOSIS — H9193 Unspecified hearing loss, bilateral: Secondary | ICD-10-CM | POA: Diagnosis not present

## 2017-09-03 DIAGNOSIS — I10 Essential (primary) hypertension: Secondary | ICD-10-CM | POA: Diagnosis not present

## 2017-09-03 DIAGNOSIS — I480 Paroxysmal atrial fibrillation: Secondary | ICD-10-CM | POA: Diagnosis not present

## 2017-09-03 DIAGNOSIS — F0281 Dementia in other diseases classified elsewhere with behavioral disturbance: Secondary | ICD-10-CM | POA: Diagnosis not present

## 2017-09-03 DIAGNOSIS — E119 Type 2 diabetes mellitus without complications: Secondary | ICD-10-CM | POA: Diagnosis not present

## 2017-09-03 DIAGNOSIS — I2510 Atherosclerotic heart disease of native coronary artery without angina pectoris: Secondary | ICD-10-CM | POA: Diagnosis not present

## 2017-09-03 DIAGNOSIS — E785 Hyperlipidemia, unspecified: Secondary | ICD-10-CM | POA: Diagnosis not present

## 2017-09-03 DIAGNOSIS — J449 Chronic obstructive pulmonary disease, unspecified: Secondary | ICD-10-CM | POA: Diagnosis not present

## 2017-09-03 DIAGNOSIS — G20 Parkinson's disease: Secondary | ICD-10-CM | POA: Diagnosis not present

## 2017-09-06 DIAGNOSIS — G20 Parkinson's disease: Secondary | ICD-10-CM | POA: Diagnosis not present

## 2017-09-06 DIAGNOSIS — E119 Type 2 diabetes mellitus without complications: Secondary | ICD-10-CM | POA: Diagnosis not present

## 2017-09-06 DIAGNOSIS — H9193 Unspecified hearing loss, bilateral: Secondary | ICD-10-CM | POA: Diagnosis not present

## 2017-09-06 DIAGNOSIS — J449 Chronic obstructive pulmonary disease, unspecified: Secondary | ICD-10-CM | POA: Diagnosis not present

## 2017-09-06 DIAGNOSIS — E785 Hyperlipidemia, unspecified: Secondary | ICD-10-CM | POA: Diagnosis not present

## 2017-09-06 DIAGNOSIS — M6281 Muscle weakness (generalized): Secondary | ICD-10-CM | POA: Diagnosis not present

## 2017-09-06 DIAGNOSIS — I2510 Atherosclerotic heart disease of native coronary artery without angina pectoris: Secondary | ICD-10-CM | POA: Diagnosis not present

## 2017-09-06 DIAGNOSIS — I480 Paroxysmal atrial fibrillation: Secondary | ICD-10-CM | POA: Diagnosis not present

## 2017-09-06 DIAGNOSIS — F0281 Dementia in other diseases classified elsewhere with behavioral disturbance: Secondary | ICD-10-CM | POA: Diagnosis not present

## 2017-09-06 DIAGNOSIS — F0391 Unspecified dementia with behavioral disturbance: Secondary | ICD-10-CM | POA: Diagnosis not present

## 2017-09-06 DIAGNOSIS — I10 Essential (primary) hypertension: Secondary | ICD-10-CM | POA: Diagnosis not present

## 2017-09-09 DIAGNOSIS — I10 Essential (primary) hypertension: Secondary | ICD-10-CM | POA: Diagnosis not present

## 2017-09-09 DIAGNOSIS — G20 Parkinson's disease: Secondary | ICD-10-CM | POA: Diagnosis not present

## 2017-09-09 DIAGNOSIS — H9193 Unspecified hearing loss, bilateral: Secondary | ICD-10-CM | POA: Diagnosis not present

## 2017-09-09 DIAGNOSIS — F0281 Dementia in other diseases classified elsewhere with behavioral disturbance: Secondary | ICD-10-CM | POA: Diagnosis not present

## 2017-09-09 DIAGNOSIS — E119 Type 2 diabetes mellitus without complications: Secondary | ICD-10-CM | POA: Diagnosis not present

## 2017-09-09 DIAGNOSIS — E785 Hyperlipidemia, unspecified: Secondary | ICD-10-CM | POA: Diagnosis not present

## 2017-09-09 DIAGNOSIS — I2510 Atherosclerotic heart disease of native coronary artery without angina pectoris: Secondary | ICD-10-CM | POA: Diagnosis not present

## 2017-09-09 DIAGNOSIS — I480 Paroxysmal atrial fibrillation: Secondary | ICD-10-CM | POA: Diagnosis not present

## 2017-09-09 DIAGNOSIS — J449 Chronic obstructive pulmonary disease, unspecified: Secondary | ICD-10-CM | POA: Diagnosis not present

## 2017-09-11 DIAGNOSIS — I10 Essential (primary) hypertension: Secondary | ICD-10-CM | POA: Diagnosis not present

## 2017-09-11 DIAGNOSIS — E119 Type 2 diabetes mellitus without complications: Secondary | ICD-10-CM | POA: Diagnosis not present

## 2017-09-11 DIAGNOSIS — J449 Chronic obstructive pulmonary disease, unspecified: Secondary | ICD-10-CM | POA: Diagnosis not present

## 2017-09-11 DIAGNOSIS — H9193 Unspecified hearing loss, bilateral: Secondary | ICD-10-CM | POA: Diagnosis not present

## 2017-09-11 DIAGNOSIS — F0281 Dementia in other diseases classified elsewhere with behavioral disturbance: Secondary | ICD-10-CM | POA: Diagnosis not present

## 2017-09-11 DIAGNOSIS — I2510 Atherosclerotic heart disease of native coronary artery without angina pectoris: Secondary | ICD-10-CM | POA: Diagnosis not present

## 2017-09-11 DIAGNOSIS — G20 Parkinson's disease: Secondary | ICD-10-CM | POA: Diagnosis not present

## 2017-09-11 DIAGNOSIS — E785 Hyperlipidemia, unspecified: Secondary | ICD-10-CM | POA: Diagnosis not present

## 2017-09-11 DIAGNOSIS — I480 Paroxysmal atrial fibrillation: Secondary | ICD-10-CM | POA: Diagnosis not present

## 2017-09-14 DIAGNOSIS — F0281 Dementia in other diseases classified elsewhere with behavioral disturbance: Secondary | ICD-10-CM | POA: Diagnosis not present

## 2017-09-14 DIAGNOSIS — G20 Parkinson's disease: Secondary | ICD-10-CM | POA: Diagnosis not present

## 2017-09-14 DIAGNOSIS — E119 Type 2 diabetes mellitus without complications: Secondary | ICD-10-CM | POA: Diagnosis not present

## 2017-09-14 DIAGNOSIS — I480 Paroxysmal atrial fibrillation: Secondary | ICD-10-CM | POA: Diagnosis not present

## 2017-09-14 DIAGNOSIS — I2510 Atherosclerotic heart disease of native coronary artery without angina pectoris: Secondary | ICD-10-CM | POA: Diagnosis not present

## 2017-09-16 DIAGNOSIS — R296 Repeated falls: Secondary | ICD-10-CM | POA: Diagnosis not present

## 2017-09-16 DIAGNOSIS — R2681 Unsteadiness on feet: Secondary | ICD-10-CM | POA: Diagnosis not present

## 2017-09-16 DIAGNOSIS — F05 Delirium due to known physiological condition: Secondary | ICD-10-CM | POA: Diagnosis not present

## 2017-09-16 DIAGNOSIS — M129 Arthropathy, unspecified: Secondary | ICD-10-CM | POA: Diagnosis not present

## 2017-09-16 DIAGNOSIS — J449 Chronic obstructive pulmonary disease, unspecified: Secondary | ICD-10-CM | POA: Diagnosis not present

## 2017-09-16 DIAGNOSIS — F0390 Unspecified dementia without behavioral disturbance: Secondary | ICD-10-CM | POA: Diagnosis not present

## 2017-09-17 DIAGNOSIS — F0281 Dementia in other diseases classified elsewhere with behavioral disturbance: Secondary | ICD-10-CM | POA: Diagnosis not present

## 2017-09-17 DIAGNOSIS — I10 Essential (primary) hypertension: Secondary | ICD-10-CM | POA: Diagnosis not present

## 2017-09-17 DIAGNOSIS — E119 Type 2 diabetes mellitus without complications: Secondary | ICD-10-CM | POA: Diagnosis not present

## 2017-09-17 DIAGNOSIS — H9193 Unspecified hearing loss, bilateral: Secondary | ICD-10-CM | POA: Diagnosis not present

## 2017-09-17 DIAGNOSIS — I480 Paroxysmal atrial fibrillation: Secondary | ICD-10-CM | POA: Diagnosis not present

## 2017-09-17 DIAGNOSIS — E785 Hyperlipidemia, unspecified: Secondary | ICD-10-CM | POA: Diagnosis not present

## 2017-09-17 DIAGNOSIS — I2510 Atherosclerotic heart disease of native coronary artery without angina pectoris: Secondary | ICD-10-CM | POA: Diagnosis not present

## 2017-09-17 DIAGNOSIS — J449 Chronic obstructive pulmonary disease, unspecified: Secondary | ICD-10-CM | POA: Diagnosis not present

## 2017-09-17 DIAGNOSIS — G20 Parkinson's disease: Secondary | ICD-10-CM | POA: Diagnosis not present

## 2017-09-19 DIAGNOSIS — H9193 Unspecified hearing loss, bilateral: Secondary | ICD-10-CM | POA: Diagnosis not present

## 2017-09-19 DIAGNOSIS — I2510 Atherosclerotic heart disease of native coronary artery without angina pectoris: Secondary | ICD-10-CM | POA: Diagnosis not present

## 2017-09-19 DIAGNOSIS — I10 Essential (primary) hypertension: Secondary | ICD-10-CM | POA: Diagnosis not present

## 2017-09-19 DIAGNOSIS — F0281 Dementia in other diseases classified elsewhere with behavioral disturbance: Secondary | ICD-10-CM | POA: Diagnosis not present

## 2017-09-19 DIAGNOSIS — E119 Type 2 diabetes mellitus without complications: Secondary | ICD-10-CM | POA: Diagnosis not present

## 2017-09-19 DIAGNOSIS — J449 Chronic obstructive pulmonary disease, unspecified: Secondary | ICD-10-CM | POA: Diagnosis not present

## 2017-09-19 DIAGNOSIS — G20 Parkinson's disease: Secondary | ICD-10-CM | POA: Diagnosis not present

## 2017-09-19 DIAGNOSIS — I480 Paroxysmal atrial fibrillation: Secondary | ICD-10-CM | POA: Diagnosis not present

## 2017-09-19 DIAGNOSIS — E785 Hyperlipidemia, unspecified: Secondary | ICD-10-CM | POA: Diagnosis not present

## 2017-09-24 DIAGNOSIS — F0281 Dementia in other diseases classified elsewhere with behavioral disturbance: Secondary | ICD-10-CM | POA: Diagnosis not present

## 2017-09-24 DIAGNOSIS — H9193 Unspecified hearing loss, bilateral: Secondary | ICD-10-CM | POA: Diagnosis not present

## 2017-09-24 DIAGNOSIS — I10 Essential (primary) hypertension: Secondary | ICD-10-CM | POA: Diagnosis not present

## 2017-09-24 DIAGNOSIS — E785 Hyperlipidemia, unspecified: Secondary | ICD-10-CM | POA: Diagnosis not present

## 2017-09-24 DIAGNOSIS — E119 Type 2 diabetes mellitus without complications: Secondary | ICD-10-CM | POA: Diagnosis not present

## 2017-09-24 DIAGNOSIS — I2510 Atherosclerotic heart disease of native coronary artery without angina pectoris: Secondary | ICD-10-CM | POA: Diagnosis not present

## 2017-09-24 DIAGNOSIS — J449 Chronic obstructive pulmonary disease, unspecified: Secondary | ICD-10-CM | POA: Diagnosis not present

## 2017-09-24 DIAGNOSIS — I480 Paroxysmal atrial fibrillation: Secondary | ICD-10-CM | POA: Diagnosis not present

## 2017-09-24 DIAGNOSIS — G20 Parkinson's disease: Secondary | ICD-10-CM | POA: Diagnosis not present

## 2017-09-26 DIAGNOSIS — Z515 Encounter for palliative care: Secondary | ICD-10-CM | POA: Diagnosis not present

## 2017-09-26 DIAGNOSIS — F0391 Unspecified dementia with behavioral disturbance: Secondary | ICD-10-CM | POA: Diagnosis not present

## 2017-09-26 DIAGNOSIS — R410 Disorientation, unspecified: Secondary | ICD-10-CM | POA: Diagnosis not present

## 2017-09-26 DIAGNOSIS — G20 Parkinson's disease: Secondary | ICD-10-CM | POA: Diagnosis not present

## 2017-10-03 ENCOUNTER — Encounter (HOSPITAL_COMMUNITY): Payer: Self-pay | Admitting: Emergency Medicine

## 2017-10-03 ENCOUNTER — Inpatient Hospital Stay (HOSPITAL_COMMUNITY)
Admission: EM | Admit: 2017-10-03 | Discharge: 2017-10-12 | DRG: 689 | Disposition: A | Discharge status: Skilled Nursing Facility | Payer: Medicare HMO | Attending: Internal Medicine | Admitting: Internal Medicine

## 2017-10-03 ENCOUNTER — Emergency Department (HOSPITAL_COMMUNITY): Payer: Medicare HMO

## 2017-10-03 DIAGNOSIS — N289 Disorder of kidney and ureter, unspecified: Secondary | ICD-10-CM | POA: Diagnosis present

## 2017-10-03 DIAGNOSIS — L89322 Pressure ulcer of left buttock, stage 2: Secondary | ICD-10-CM | POA: Diagnosis not present

## 2017-10-03 DIAGNOSIS — E43 Unspecified severe protein-calorie malnutrition: Secondary | ICD-10-CM | POA: Diagnosis not present

## 2017-10-03 DIAGNOSIS — R509 Fever, unspecified: Secondary | ICD-10-CM | POA: Diagnosis not present

## 2017-10-03 DIAGNOSIS — J449 Chronic obstructive pulmonary disease, unspecified: Secondary | ICD-10-CM | POA: Diagnosis not present

## 2017-10-03 DIAGNOSIS — Z8673 Personal history of transient ischemic attack (TIA), and cerebral infarction without residual deficits: Secondary | ICD-10-CM

## 2017-10-03 DIAGNOSIS — R1312 Dysphagia, oropharyngeal phase: Secondary | ICD-10-CM | POA: Diagnosis not present

## 2017-10-03 DIAGNOSIS — N390 Urinary tract infection, site not specified: Secondary | ICD-10-CM

## 2017-10-03 DIAGNOSIS — F0280 Dementia in other diseases classified elsewhere without behavioral disturbance: Secondary | ICD-10-CM | POA: Diagnosis present

## 2017-10-03 DIAGNOSIS — R471 Dysarthria and anarthria: Secondary | ICD-10-CM | POA: Diagnosis not present

## 2017-10-03 DIAGNOSIS — E46 Unspecified protein-calorie malnutrition: Secondary | ICD-10-CM

## 2017-10-03 DIAGNOSIS — F419 Anxiety disorder, unspecified: Secondary | ICD-10-CM | POA: Diagnosis present

## 2017-10-03 DIAGNOSIS — Z791 Long term (current) use of non-steroidal anti-inflammatories (NSAID): Secondary | ICD-10-CM

## 2017-10-03 DIAGNOSIS — G3183 Dementia with Lewy bodies: Secondary | ICD-10-CM | POA: Diagnosis present

## 2017-10-03 DIAGNOSIS — Z23 Encounter for immunization: Secondary | ICD-10-CM

## 2017-10-03 DIAGNOSIS — Z66 Do not resuscitate: Secondary | ICD-10-CM | POA: Diagnosis not present

## 2017-10-03 DIAGNOSIS — D631 Anemia in chronic kidney disease: Secondary | ICD-10-CM | POA: Diagnosis present

## 2017-10-03 DIAGNOSIS — D649 Anemia, unspecified: Secondary | ICD-10-CM | POA: Diagnosis not present

## 2017-10-03 DIAGNOSIS — R627 Adult failure to thrive: Secondary | ICD-10-CM | POA: Diagnosis not present

## 2017-10-03 DIAGNOSIS — I480 Paroxysmal atrial fibrillation: Secondary | ICD-10-CM | POA: Diagnosis not present

## 2017-10-03 DIAGNOSIS — M6281 Muscle weakness (generalized): Secondary | ICD-10-CM | POA: Diagnosis not present

## 2017-10-03 DIAGNOSIS — R404 Transient alteration of awareness: Secondary | ICD-10-CM | POA: Diagnosis not present

## 2017-10-03 DIAGNOSIS — T50901A Poisoning by unspecified drugs, medicaments and biological substances, accidental (unintentional), initial encounter: Secondary | ICD-10-CM | POA: Diagnosis present

## 2017-10-03 DIAGNOSIS — R531 Weakness: Secondary | ICD-10-CM | POA: Diagnosis not present

## 2017-10-03 DIAGNOSIS — F39 Unspecified mood [affective] disorder: Secondary | ICD-10-CM | POA: Diagnosis present

## 2017-10-03 DIAGNOSIS — I10 Essential (primary) hypertension: Secondary | ICD-10-CM | POA: Diagnosis present

## 2017-10-03 DIAGNOSIS — E8809 Other disorders of plasma-protein metabolism, not elsewhere classified: Secondary | ICD-10-CM

## 2017-10-03 DIAGNOSIS — E119 Type 2 diabetes mellitus without complications: Secondary | ICD-10-CM | POA: Diagnosis not present

## 2017-10-03 DIAGNOSIS — Z79899 Other long term (current) drug therapy: Secondary | ICD-10-CM

## 2017-10-03 DIAGNOSIS — Z7189 Other specified counseling: Secondary | ICD-10-CM

## 2017-10-03 DIAGNOSIS — B9620 Unspecified Escherichia coli [E. coli] as the cause of diseases classified elsewhere: Secondary | ICD-10-CM | POA: Diagnosis present

## 2017-10-03 DIAGNOSIS — R441 Visual hallucinations: Secondary | ICD-10-CM | POA: Diagnosis present

## 2017-10-03 DIAGNOSIS — F0281 Dementia in other diseases classified elsewhere with behavioral disturbance: Secondary | ICD-10-CM | POA: Diagnosis not present

## 2017-10-03 DIAGNOSIS — Z6821 Body mass index (BMI) 21.0-21.9, adult: Secondary | ICD-10-CM

## 2017-10-03 DIAGNOSIS — Y92009 Unspecified place in unspecified non-institutional (private) residence as the place of occurrence of the external cause: Secondary | ICD-10-CM

## 2017-10-03 DIAGNOSIS — L8990 Pressure ulcer of unspecified site, unspecified stage: Secondary | ICD-10-CM | POA: Insufficient documentation

## 2017-10-03 DIAGNOSIS — N3000 Acute cystitis without hematuria: Secondary | ICD-10-CM | POA: Diagnosis not present

## 2017-10-03 DIAGNOSIS — R5383 Other fatigue: Secondary | ICD-10-CM | POA: Diagnosis not present

## 2017-10-03 DIAGNOSIS — R410 Disorientation, unspecified: Secondary | ICD-10-CM | POA: Diagnosis not present

## 2017-10-03 DIAGNOSIS — G9341 Metabolic encephalopathy: Secondary | ICD-10-CM | POA: Diagnosis present

## 2017-10-03 DIAGNOSIS — R2689 Other abnormalities of gait and mobility: Secondary | ICD-10-CM | POA: Diagnosis not present

## 2017-10-03 DIAGNOSIS — R4182 Altered mental status, unspecified: Secondary | ICD-10-CM | POA: Diagnosis not present

## 2017-10-03 DIAGNOSIS — F29 Unspecified psychosis not due to a substance or known physiological condition: Secondary | ICD-10-CM | POA: Diagnosis not present

## 2017-10-03 DIAGNOSIS — N400 Enlarged prostate without lower urinary tract symptoms: Secondary | ICD-10-CM | POA: Diagnosis present

## 2017-10-03 DIAGNOSIS — Z87891 Personal history of nicotine dependence: Secondary | ICD-10-CM

## 2017-10-03 DIAGNOSIS — G9340 Encephalopathy, unspecified: Secondary | ICD-10-CM

## 2017-10-03 DIAGNOSIS — Z85828 Personal history of other malignant neoplasm of skin: Secondary | ICD-10-CM

## 2017-10-03 DIAGNOSIS — E785 Hyperlipidemia, unspecified: Secondary | ICD-10-CM | POA: Diagnosis present

## 2017-10-03 DIAGNOSIS — T50901D Poisoning by unspecified drugs, medicaments and biological substances, accidental (unintentional), subsequent encounter: Secondary | ICD-10-CM | POA: Diagnosis not present

## 2017-10-03 DIAGNOSIS — R279 Unspecified lack of coordination: Secondary | ICD-10-CM | POA: Diagnosis not present

## 2017-10-03 DIAGNOSIS — G20 Parkinson's disease: Secondary | ICD-10-CM | POA: Diagnosis present

## 2017-10-03 DIAGNOSIS — I4891 Unspecified atrial fibrillation: Secondary | ICD-10-CM | POA: Diagnosis not present

## 2017-10-03 DIAGNOSIS — Z9981 Dependence on supplemental oxygen: Secondary | ICD-10-CM | POA: Diagnosis not present

## 2017-10-03 DIAGNOSIS — I2510 Atherosclerotic heart disease of native coronary artery without angina pectoris: Secondary | ICD-10-CM | POA: Diagnosis not present

## 2017-10-03 DIAGNOSIS — M1990 Unspecified osteoarthritis, unspecified site: Secondary | ICD-10-CM | POA: Diagnosis present

## 2017-10-03 DIAGNOSIS — Z515 Encounter for palliative care: Secondary | ICD-10-CM | POA: Diagnosis present

## 2017-10-03 DIAGNOSIS — Z823 Family history of stroke: Secondary | ICD-10-CM

## 2017-10-03 DIAGNOSIS — H9193 Unspecified hearing loss, bilateral: Secondary | ICD-10-CM | POA: Diagnosis not present

## 2017-10-03 HISTORY — DX: Parkinson's disease without dyskinesia, without mention of fluctuations: G20.A1

## 2017-10-03 HISTORY — DX: Parkinson's disease: G20

## 2017-10-03 HISTORY — DX: Anxiety disorder, unspecified: F41.9

## 2017-10-03 LAB — CBC WITH DIFFERENTIAL/PLATELET
Basophils Absolute: 0 10*3/uL (ref 0.0–0.1)
Basophils Relative: 0 %
EOS ABS: 0.1 10*3/uL (ref 0.0–0.7)
EOS PCT: 1 %
HCT: 37.3 % — ABNORMAL LOW (ref 39.0–52.0)
HEMOGLOBIN: 12.5 g/dL — AB (ref 13.0–17.0)
LYMPHS ABS: 1.7 10*3/uL (ref 0.7–4.0)
Lymphocytes Relative: 21 %
MCH: 28.6 pg (ref 26.0–34.0)
MCHC: 33.5 g/dL (ref 30.0–36.0)
MCV: 85.4 fL (ref 78.0–100.0)
MONOS PCT: 4 %
Monocytes Absolute: 0.3 10*3/uL (ref 0.1–1.0)
NEUTROS PCT: 74 %
Neutro Abs: 5.8 10*3/uL (ref 1.7–7.7)
Platelets: 230 10*3/uL (ref 150–400)
RBC: 4.37 MIL/uL (ref 4.22–5.81)
RDW: 14.2 % (ref 11.5–15.5)
WBC: 7.9 10*3/uL (ref 4.0–10.5)

## 2017-10-03 LAB — COMPREHENSIVE METABOLIC PANEL
AST: 11 U/L — AB (ref 15–41)
Albumin: 3.2 g/dL — ABNORMAL LOW (ref 3.5–5.0)
Alkaline Phosphatase: 49 U/L (ref 38–126)
Anion gap: 6 (ref 5–15)
BILIRUBIN TOTAL: 1 mg/dL (ref 0.3–1.2)
BUN: 35 mg/dL — AB (ref 6–20)
CO2: 25 mmol/L (ref 22–32)
CREATININE: 1.36 mg/dL — AB (ref 0.61–1.24)
Calcium: 8.6 mg/dL — ABNORMAL LOW (ref 8.9–10.3)
Chloride: 104 mmol/L (ref 101–111)
GFR calc Af Amer: 52 mL/min — ABNORMAL LOW (ref >60)
GFR, EST NON AFRICAN AMERICAN: 45 mL/min — AB (ref >60)
Glucose, Bld: 110 mg/dL — ABNORMAL HIGH (ref 65–99)
Potassium: 4.2 mmol/L (ref 3.5–5.1)
Sodium: 135 mmol/L (ref 135–145)
TOTAL PROTEIN: 5.7 g/dL — AB (ref 6.5–8.1)

## 2017-10-03 LAB — SALICYLATE LEVEL: Salicylate Lvl: <7 mg/dL (ref 2.8–30.0)

## 2017-10-03 LAB — ETHANOL

## 2017-10-03 LAB — ACETAMINOPHEN LEVEL: Acetaminophen (Tylenol), Serum: <10 ug/mL — ABNORMAL LOW (ref 10–30)

## 2017-10-03 LAB — I-STAT CG4 LACTIC ACID, ED: LACTIC ACID, VENOUS: 0.86 mmol/L (ref 0.5–1.9)

## 2017-10-03 MED ORDER — SODIUM CHLORIDE 0.9 % IV BOLUS (SEPSIS)
1000.0000 mL | Freq: Once | INTRAVENOUS | Status: AC
  Administered 2017-10-03: 1000 mL via INTRAVENOUS

## 2017-10-03 NOTE — ED Provider Notes (Signed)
[facility] DEPT Provider Note   CSN: [id number] Arrival date & time: 10/03/17  2115     History   Chief Complaint Chief Complaint  Patient presents with  . Failure To Thrive    HPI YIDEL TEUSCHER is a 83 y.o. male.  The history is provided by the patient and medical records.     Level V caveat: AMS 83 year old male with history of arthritis, BPH, chronic bronchitis, COPD, GERD, hyperlipidemia, hypertension, parkinson's disease, presenting to the ED with family with concerns of patient being intentionally overmedicated.  Patient's son and daughter at bedside providing most of the history.  They report over the past 3 weeks patient has been on a steady decline. They states usually he is able to get up and walk around with his walker, but has been very weak and drowsy lately, unable to get out of the bed. Son states he has been taking him off and putting him in a chair and having to put him back into bed at night. Patient's children report they found out today that patient was started on 3 different antipsychotic medications in the past few weeks. Daughter also report that patient was being given Depakote, however this is not prescribed to him.  Son reports she got a printout of all of patient's prescriptions at the pharmacy and got a prescription for risperidone that was picked up recently. Patient's wife denied this and would not give them the medication bottle to view. This was found by EMS and only had 4 tablets left. Patient's wife states she threw them away, however no pills were recovered.  Family reports patient does have a home health RN that comes to their home a few times a week.  She recently changed her schedule of when she comes and family has noticed that patient is much more awake/alert when she is there as they feel his medications are more regimented.  Past Medical History:  Diagnosis Date  . Arthritis    "bad in his knees" (01/09/2017)  . Benign prostatic  hypertrophy    hx  . Chronic airway obstruction, not elsewhere classified   . Chronic bronchitis ([place])   . Coronary atherosclerosis of unspecified type of vessel, native or graft   . GERD (gastroesophageal reflux disease)   . New onset atrial fibrillation ([place]) 01/08/2017   [name] 01/08/2017  . On home oxygen therapy    "prn" (01/09/2017)  . Osteoarthrosis, unspecified whether generalized or localized, unspecified site   . Pneumonia    "more than once" (01/09/2017)  . Pure hypercholesterolemia    IIA  . Skin cancer of face   . Stroke [facility])    "they think he had a minor stroke 1-2 yr ago" (01/09/2017)  . Unspecified essential hypertension     Patient Active Problem List   Diagnosis Date Noted  . Paroxysmal A-fib ([place]) 01/11/2017  . Influenza A 01/09/2017  . COPD (chronic obstructive pulmonary disease) ([place]) 01/08/2017  . Dementia 09/13/2016  . Chest pain, rule out acute myocardial infarction 09/12/2016  . Fever 03/07/2013  . Acute encephalopathy 03/07/2013  . Weakness generalized 03/07/2013  . HYPERCHOLESTEROLEMIA  IIA 04/09/2009  . Essential hypertension 04/09/2009  . Coronary artery disease due to lipid rich plaque 04/09/2009  . COPD exacerbation ([place]) 04/09/2009  . OSTEOARTHRITIS 04/09/2009  . BENIGN PROSTATIC HYPERTROPHY, HX OF 04/09/2009    Past Surgical History:  Procedure Laterality Date  . CERVICAL DISCECTOMY    . FRACTURE SURGERY    . SKIN CANCER DESTRUCTION  Right    "face"  . TIBIA FRACTURE SURGERY Right    "he's got a pin in there"       Home Medications    Prior to Admission medications   Medication Sig Start Date End Date Taking? Authorizing Provider  acetaminophen (TYLENOL) 500 MG tablet Take 500 mg by mouth every 6 (six) hours as needed for mild pain.    [provider]  albuterol (PROVENTIL) (2.5 MG/3ML) 0.083% nebulizer solution Take 2.5 mg by nebulization every 6 (six) hours as needed for wheezing or shortness of breath.    [provider]  aspirin EC 81 MG tablet Take 81 mg by mouth daily.    [provider]  azithromycin (ZITHROMAX) 250 MG tablet Take 1 tablet (250 mg total) by mouth daily. 01/11/17   Debbe Odea, MD  cholecalciferol (VITAMIN D) 1000 units tablet Take 1,000 Units by mouth daily.    [provider]  finasteride (PROSCAR) 5 MG tablet Take 1 tablet (5 mg total) by mouth daily. 09/14/16   Ghimire, Henreitta Leber, MD  Fluticasone-Salmeterol (ADVAIR) 250-50 MCG/DOSE AEPB Inhale 1 puff into the lungs daily as needed (shortness of breath).     [provider]  hydroxypropyl methylcellulose / hypromellose (ISOPTO TEARS / GONIOVISC) 2.5 % ophthalmic solution Place 1 drop into both eyes 4 (four) times daily as needed for dry eyes.    [provider]  isosorbide mononitrate (IMDUR) 30 MG 24 hr tablet Take 0.5 tablets (15 mg total) by mouth daily. 09/13/16   Ghimire, Henreitta Leber, MD  losartan (COZAAR) 50 MG tablet Take 1 tablet (50 mg total) by mouth daily. 03/10/13   Bynum Bellows, MD  meloxicam (MOBIC) 7.5 MG tablet Take 7.5 mg by mouth daily.    [provider]  Multiple Vitamin (MULTIVITAMIN WITH MINERALS) TABS tablet Take 1 tablet by mouth daily.    [provider]  omega-3 acid ethyl esters (LOVAZA) 1 g capsule Take 1 g by mouth daily.    [provider]  oseltamivir (TAMIFLU) 30 MG capsule Take 1 capsule (30 mg total) by mouth 2 (two) times daily. 01/11/17   Debbe Odea, MD  pravastatin (PRAVACHOL) 40 MG tablet Take 40 mg by mouth daily.    [provider]  predniSONE (DELTASONE) 20 MG tablet 40 mg tomorrow, 20 mg the next day and 10 mg the last day 01/11/17   Debbe Odea, MD  tamsulosin (FLOMAX) 0.4 MG CAPS Take 0.4 mg by mouth daily.    [provider]  tiotropium (SPIRIVA HANDIHALER) 18 MCG inhalation capsule Place 18 mcg into inhaler and inhale daily.    [provider]    Family History Family History  Problem  Relation Age of Onset  . Coronary artery disease Unknown        family hx  . Stroke Mother   . Stroke Father     Social History Social History  Substance Use Topics  . Smoking status: Former Smoker    Years: 40.00    Types: Cigarettes    Quit date: 1979  . Smokeless tobacco: Never Used  . Alcohol use Yes     Comment: 01/09/2017 "nothing in the past several years"     Allergies   Bee venom   Review of Systems Review of Systems  Unable to perform ROS: Mental status change     Physical Exam Updated Vital Signs BP 110/75   Pulse 69   Temp 98.4 F (36.9 C) (Oral)  Resp 14   Ht 6' (1.829 m)   Wt 74.4 kg (164 lb)   SpO2 93%   BMI 22.24 kg/m   Physical Exam  Constitutional: He is oriented to person, place, and time. He appears well-developed and well-nourished.  HENT:  Head: Normocephalic and atraumatic.  Mouth/Throat: Oropharynx is clear and moist.  Mucous membranes are very dry  Eyes: Pupils are equal, round, and reactive to light. Conjunctivae and EOM are normal.  Neck: Normal range of motion.  Cardiovascular: Normal rate, regular rhythm and normal heart sounds.   Pulmonary/Chest: Effort normal and breath sounds normal.  Abdominal: Soft. Bowel sounds are normal.  Musculoskeletal: Normal range of motion.  Neurological: He is alert and oriented to person, place, and time.  Drowsy appearing, responding to questioning but speech is very slow and somewhat slurred  Skin: Skin is warm and dry.  Psychiatric: He has a normal mood and affect.  Nursing note and vitals reviewed.    ED Treatments / Results  Labs (all labs ordered are listed, but only abnormal results are displayed) Labs Reviewed  COMPREHENSIVE METABOLIC PANEL - Abnormal; Notable for the following:       Result Value   Glucose, Bld 110 (*)    BUN 35 (*)    Creatinine, Ser 1.36 (*)    Calcium 8.6 (*)    Total Protein 5.7 (*)    Albumin 3.2 (*)    AST 11 (*)    ALT <5 (*)    GFR calc non Af  Amer 45 (*)    GFR calc Af Amer 52 (*)    All other components within normal limits  ACETAMINOPHEN LEVEL - Abnormal; Notable for the following:    Acetaminophen (Tylenol), Serum <10 (*)    All other components within normal limits  CBC WITH DIFFERENTIAL/PLATELET - Abnormal; Notable for the following:    Hemoglobin 12.5 (*)    HCT 37.3 (*)    All other components within normal limits  VALPROIC ACID LEVEL - Abnormal; Notable for the following:    Valproic Acid Lvl <10 (*)    All other components within normal limits  URINALYSIS, ROUTINE W REFLEX MICROSCOPIC - Abnormal; Notable for the following:    APPearance CLOUDY (*)    Hgb urine dipstick LARGE (*)    Ketones, ur 5 (*)    Protein, ur 100 (*)    Nitrite POSITIVE (*)    Leukocytes, UA LARGE (*)    Bacteria, UA MANY (*)    Squamous Epithelial / LPF 0-5 (*)    All other components within normal limits  URINE CULTURE  ETHANOL  SALICYLATE LEVEL  RAPID URINE DRUG SCREEN, HOSP PERFORMED  I-STAT CG4 LACTIC ACID, ED    EKG  EKG Interpretation None       Radiology Dg Chest 2 View  Result Date: 10/03/2017 CLINICAL DATA:  Lethargy EXAM: CHEST  2 VIEW COMPARISON:  01/08/2017 FINDINGS: Low lung volumes with mild bibasilar atelectasis. No pleural effusion. No focal consolidation. Normal cardiomediastinal silhouette with atherosclerosis. No pneumothorax. IMPRESSION: Low lung volumes.  No focal infiltrate. Electronically Signed   By: Donavan Foil M.D.   On: 10/03/2017 23:41    Procedures Procedures (including critical care time)  Medications Ordered in ED Medications  sodium chloride 0.9 % bolus 1,000 mL (0 mLs Intravenous Stopped 10/04/17 0050)  cefTRIAXone (ROCEPHIN) 1 g in dextrose 5 % 50 mL IVPB (0 g Intravenous Stopped 10/04/17 0244)     Initial Impression / Assessment and  Plan / ED Course  I have reviewed the triage vital signs and the nursing notes.  Pertinent labs & imaging results that were available during my care of  the patient were reviewed by me and considered in my medical decision making (see chart for details).  83 year old male here with concerns of being overmedicated at home. Patient's children report he has been receiving multiple doses of medications, some of which not prescribed to him. Does have a home health nurse that comes 3 times a week, on the day she is their patient is much more awake and alert.There do appear to be some social issues going on in the home, and all protective services has been involved with family previously. Daughter does report that patient has been given Depakote that was not prescribed to him. On exam, patient is drowsy but arousable and will answer questions and follow commands when prompted. There does appear to be a component of polypharmacy. Will screen for infectious process, Depakote toxicity, or other metabolic instability. Patient clinically dry here, given IV fluids.  Case management and social work aware of the patient and have already met with family.  Screening labs overall reassuring. Depakote level not toxic.  UA does appear infectious, will send for culture.  Normal WBC count and lactate.  CXR clear.  Patient has become more awake, alert during ED stay. He will require admission for treatment of his UTI, possibly some medication adjustments to help with his drowsiness.    Discussed with Dr. Maudie Mercury-- will admit for ongoing care.   Final Clinical Impressions(s) / ED Diagnoses   Final diagnoses:  Polypharmacy  Acute cystitis without hematuria    New Prescriptions New Prescriptions   No medications on file     Larene Pickett, PA-C 10/04/17 0300    Jola Schmidt, MD 10/04/17 [id number]

## 2017-10-03 NOTE — ED Notes (Signed)
Patient transported to X-ray 

## 2017-10-03 NOTE — Care Management Note (Signed)
Case Management Note  Patient Details  Name: Terry Taylor MRN: [id number] Date of Birth: 10/14/1928  Subjective/Objective:         Patient presented to [facility] ED with failure to thrive       Action/Plan: ED CM was consulted by Terry Jacobson RN with concerns by family patient appears to be overmedicated. Patient lives at home with spouse.  CM met with patient and family at bedside to discuss [facility] recommendations family reports patient being active with [facility] with nursing services, PT was refused by spouse. Patient's adult children are the POA . Son  Terry Taylor is concerned over discharging patient to return home with spouse.  CM explained CM and  CSW will follow up in the am  for transitional care planning. EDP is consulting with Hospitalist for further evaluation.  Expected Discharge Date:                  Expected Discharge Plan:  [place]  In-House Referral:  Clinical Social Work, [facility]  Discharge planning Services  CM Consult  Post Acute Care Choice:  Resumption of Svcs/PTA Provider Choice offered to:  Adult Children Terry Taylor [phone] and Terry Taylor [phone] POA)  DME Arranged:    DME Agency:     HH Arranged:    [place] Agency:  [place] (Patient is active with [facility] Nursing services)  Status of Service:  In process, will continue to follow  If discussed at Long Length of Stay Meetings, dates discussed:    Additional CommentsLaurena Slimmer, RN 10/03/2017, 11:31 PM

## 2017-10-03 NOTE — ED Triage Notes (Signed)
Per EMS: Pt has had increasing malaise x 3 days. Pt has not wanted to eat or drink much of anything.  EMS was called out by home health due to shallow respirations. Pt comes from home and his family is concerned that the wife might be over medicating Pt on his Risperidone. Pt just had a new bottle of 60 filled and only 4 were left in bottle.  Pt's wife states she threw them away but fire searched trashed an was unable to find discarded Meds.  A&Ox4

## 2017-10-04 ENCOUNTER — Encounter (HOSPITAL_COMMUNITY): Payer: Self-pay | Admitting: Internal Medicine

## 2017-10-04 DIAGNOSIS — Z7189 Other specified counseling: Secondary | ICD-10-CM | POA: Diagnosis not present

## 2017-10-04 DIAGNOSIS — J449 Chronic obstructive pulmonary disease, unspecified: Secondary | ICD-10-CM | POA: Diagnosis present

## 2017-10-04 DIAGNOSIS — R4182 Altered mental status, unspecified: Secondary | ICD-10-CM

## 2017-10-04 DIAGNOSIS — Y92009 Unspecified place in unspecified non-institutional (private) residence as the place of occurrence of the external cause: Secondary | ICD-10-CM | POA: Diagnosis not present

## 2017-10-04 DIAGNOSIS — I10 Essential (primary) hypertension: Secondary | ICD-10-CM | POA: Diagnosis present

## 2017-10-04 DIAGNOSIS — Z515 Encounter for palliative care: Secondary | ICD-10-CM | POA: Diagnosis present

## 2017-10-04 DIAGNOSIS — D631 Anemia in chronic kidney disease: Secondary | ICD-10-CM | POA: Diagnosis present

## 2017-10-04 DIAGNOSIS — N289 Disorder of kidney and ureter, unspecified: Secondary | ICD-10-CM

## 2017-10-04 DIAGNOSIS — R627 Adult failure to thrive: Secondary | ICD-10-CM | POA: Diagnosis present

## 2017-10-04 DIAGNOSIS — Z8673 Personal history of transient ischemic attack (TIA), and cerebral infarction without residual deficits: Secondary | ICD-10-CM | POA: Diagnosis not present

## 2017-10-04 DIAGNOSIS — D649 Anemia, unspecified: Secondary | ICD-10-CM

## 2017-10-04 DIAGNOSIS — T50901A Poisoning by unspecified drugs, medicaments and biological substances, accidental (unintentional), initial encounter: Secondary | ICD-10-CM | POA: Diagnosis present

## 2017-10-04 DIAGNOSIS — R410 Disorientation, unspecified: Secondary | ICD-10-CM | POA: Diagnosis not present

## 2017-10-04 DIAGNOSIS — F419 Anxiety disorder, unspecified: Secondary | ICD-10-CM | POA: Diagnosis present

## 2017-10-04 DIAGNOSIS — N390 Urinary tract infection, site not specified: Secondary | ICD-10-CM

## 2017-10-04 DIAGNOSIS — T50901D Poisoning by unspecified drugs, medicaments and biological substances, accidental (unintentional), subsequent encounter: Secondary | ICD-10-CM | POA: Diagnosis not present

## 2017-10-04 DIAGNOSIS — L8990 Pressure ulcer of unspecified site, unspecified stage: Secondary | ICD-10-CM | POA: Diagnosis not present

## 2017-10-04 DIAGNOSIS — L89322 Pressure ulcer of left buttock, stage 2: Secondary | ICD-10-CM | POA: Diagnosis present

## 2017-10-04 DIAGNOSIS — I480 Paroxysmal atrial fibrillation: Secondary | ICD-10-CM | POA: Diagnosis present

## 2017-10-04 DIAGNOSIS — I2510 Atherosclerotic heart disease of native coronary artery without angina pectoris: Secondary | ICD-10-CM | POA: Diagnosis present

## 2017-10-04 DIAGNOSIS — Z87891 Personal history of nicotine dependence: Secondary | ICD-10-CM | POA: Diagnosis not present

## 2017-10-04 DIAGNOSIS — R441 Visual hallucinations: Secondary | ICD-10-CM | POA: Diagnosis present

## 2017-10-04 DIAGNOSIS — G3183 Dementia with Lewy bodies: Secondary | ICD-10-CM | POA: Diagnosis present

## 2017-10-04 DIAGNOSIS — F0280 Dementia in other diseases classified elsewhere without behavioral disturbance: Secondary | ICD-10-CM | POA: Diagnosis present

## 2017-10-04 DIAGNOSIS — F29 Unspecified psychosis not due to a substance or known physiological condition: Secondary | ICD-10-CM | POA: Diagnosis not present

## 2017-10-04 DIAGNOSIS — F39 Unspecified mood [affective] disorder: Secondary | ICD-10-CM | POA: Diagnosis present

## 2017-10-04 DIAGNOSIS — E43 Unspecified severe protein-calorie malnutrition: Secondary | ICD-10-CM | POA: Diagnosis present

## 2017-10-04 DIAGNOSIS — E785 Hyperlipidemia, unspecified: Secondary | ICD-10-CM | POA: Diagnosis present

## 2017-10-04 DIAGNOSIS — N3000 Acute cystitis without hematuria: Secondary | ICD-10-CM | POA: Diagnosis present

## 2017-10-04 DIAGNOSIS — B9620 Unspecified Escherichia coli [E. coli] as the cause of diseases classified elsewhere: Secondary | ICD-10-CM | POA: Diagnosis present

## 2017-10-04 DIAGNOSIS — Z23 Encounter for immunization: Secondary | ICD-10-CM | POA: Diagnosis present

## 2017-10-04 DIAGNOSIS — Z66 Do not resuscitate: Secondary | ICD-10-CM | POA: Diagnosis present

## 2017-10-04 DIAGNOSIS — Z85828 Personal history of other malignant neoplasm of skin: Secondary | ICD-10-CM | POA: Diagnosis not present

## 2017-10-04 DIAGNOSIS — G9341 Metabolic encephalopathy: Secondary | ICD-10-CM | POA: Diagnosis present

## 2017-10-04 DIAGNOSIS — G20 Parkinson's disease: Secondary | ICD-10-CM | POA: Diagnosis not present

## 2017-10-04 LAB — COMPREHENSIVE METABOLIC PANEL
ALBUMIN: 3.3 g/dL — AB (ref 3.5–5.0)
ALT: <5 U/L — ABNORMAL LOW (ref 17–63)
AST: 12 U/L — AB (ref 15–41)
Alkaline Phosphatase: 48 U/L (ref 38–126)
Anion gap: 13 (ref 5–15)
BUN: 32 mg/dL — AB (ref 6–20)
CHLORIDE: 101 mmol/L (ref 101–111)
CO2: 23 mmol/L (ref 22–32)
Calcium: 8.7 mg/dL — ABNORMAL LOW (ref 8.9–10.3)
Creatinine, Ser: 1.38 mg/dL — ABNORMAL HIGH (ref 0.61–1.24)
GFR calc Af Amer: 51 mL/min — ABNORMAL LOW (ref >60)
GFR calc non Af Amer: 44 mL/min — ABNORMAL LOW (ref >60)
GLUCOSE: 95 mg/dL (ref 65–99)
POTASSIUM: 4.2 mmol/L (ref 3.5–5.1)
Sodium: 137 mmol/L (ref 135–145)
Total Bilirubin: 1.2 mg/dL (ref 0.3–1.2)
Total Protein: 6.2 g/dL — ABNORMAL LOW (ref 6.5–8.1)

## 2017-10-04 LAB — VALPROIC ACID LEVEL: Valproic Acid Lvl: <10 ug/mL — ABNORMAL LOW (ref 50.0–100.0)

## 2017-10-04 LAB — MRSA PCR SCREENING: MRSA by PCR: NEGATIVE

## 2017-10-04 LAB — URINALYSIS, ROUTINE W REFLEX MICROSCOPIC
BILIRUBIN URINE: NEGATIVE
GLUCOSE, UA: NEGATIVE mg/dL
Ketones, ur: 5 mg/dL — AB
NITRITE: POSITIVE — AB
PH: 5 (ref 5.0–8.0)
PROTEIN: 100 mg/dL — AB
Specific Gravity, Urine: 1.017 (ref 1.005–1.030)

## 2017-10-04 LAB — CBC
HEMATOCRIT: 38.5 % — AB (ref 39.0–52.0)
Hemoglobin: 12.8 g/dL — ABNORMAL LOW (ref 13.0–17.0)
MCH: 28.6 pg (ref 26.0–34.0)
MCHC: 33.2 g/dL (ref 30.0–36.0)
MCV: 86.1 fL (ref 78.0–100.0)
Platelets: 216 10*3/uL (ref 150–400)
RBC: 4.47 MIL/uL (ref 4.22–5.81)
RDW: 14.3 % (ref 11.5–15.5)
WBC: 8.1 10*3/uL (ref 4.0–10.5)

## 2017-10-04 LAB — SEDIMENTATION RATE: Sed Rate: 13 mm/hr (ref 0–16)

## 2017-10-04 LAB — I-STAT ARTERIAL BLOOD GAS, ED
Acid-base deficit: 1 mmol/L (ref 0.0–2.0)
BICARBONATE: 22.6 mmol/L (ref 20.0–28.0)
O2 Saturation: 96 %
PCO2 ART: 32.7 mmHg (ref 32.0–48.0)
Patient temperature: 98.4
TCO2: 24 mmol/L (ref 22–32)
pH, Arterial: 7.448 (ref 7.350–7.450)
pO2, Arterial: 75 mmHg — ABNORMAL LOW (ref 83.0–108.0)

## 2017-10-04 LAB — RAPID URINE DRUG SCREEN, HOSP PERFORMED
Amphetamines: NOT DETECTED
BARBITURATES: NOT DETECTED
BENZODIAZEPINES: NOT DETECTED
COCAINE: NOT DETECTED
OPIATES: NOT DETECTED
TETRAHYDROCANNABINOL: NOT DETECTED

## 2017-10-04 LAB — RPR: RPR Ser Ql: NONREACTIVE

## 2017-10-04 LAB — TSH: TSH: 1.319 u[IU]/mL (ref 0.350–4.500)

## 2017-10-04 LAB — VITAMIN B12: Vitamin B-12: 561 pg/mL (ref 180–914)

## 2017-10-04 MED ORDER — ISOSORBIDE MONONITRATE ER 30 MG PO TB24
15.0000 mg | ORAL_TABLET | Freq: Every day | ORAL | Status: DC
  Administered 2017-10-04 – 2017-10-12 (×9): 15 mg via ORAL
  Filled 2017-10-04 (×10): qty 1

## 2017-10-04 MED ORDER — MELOXICAM 7.5 MG PO TABS
7.5000 mg | ORAL_TABLET | Freq: Every day | ORAL | Status: DC
  Filled 2017-10-04: qty 1

## 2017-10-04 MED ORDER — DEXTROSE 5 % IV SOLN
1.0000 g | Freq: Once | INTRAVENOUS | Status: AC
  Administered 2017-10-04: 1 g via INTRAVENOUS
  Filled 2017-10-04 (×2): qty 10

## 2017-10-04 MED ORDER — FINASTERIDE 5 MG PO TABS
5.0000 mg | ORAL_TABLET | Freq: Every day | ORAL | Status: DC
  Administered 2017-10-04 – 2017-10-12 (×9): 5 mg via ORAL
  Filled 2017-10-04 (×9): qty 1

## 2017-10-04 MED ORDER — SODIUM CHLORIDE 0.9 % IV SOLN
INTRAVENOUS | Status: AC
  Administered 2017-10-04: 06:00:00 via INTRAVENOUS

## 2017-10-04 MED ORDER — TAMSULOSIN HCL 0.4 MG PO CAPS
0.4000 mg | ORAL_CAPSULE | Freq: Every day | ORAL | Status: DC
  Administered 2017-10-05 – 2017-10-12 (×8): 0.4 mg via ORAL
  Filled 2017-10-04 (×9): qty 1

## 2017-10-04 MED ORDER — DIVALPROEX SODIUM 125 MG PO DR TAB
125.0000 mg | DELAYED_RELEASE_TABLET | Freq: Two times a day (BID) | ORAL | Status: DC
  Filled 2017-10-04 (×4): qty 1

## 2017-10-04 MED ORDER — CARBIDOPA-LEVODOPA 25-100 MG PO TABS
1.0000 | ORAL_TABLET | Freq: Three times a day (TID) | ORAL | Status: DC
  Administered 2017-10-04 – 2017-10-10 (×16): 1 via ORAL
  Filled 2017-10-04 (×24): qty 1

## 2017-10-04 MED ORDER — ENOXAPARIN SODIUM 40 MG/0.4ML ~~LOC~~ SOLN
40.0000 mg | Freq: Every day | SUBCUTANEOUS | Status: DC
  Administered 2017-10-05 – 2017-10-12 (×8): 40 mg via SUBCUTANEOUS
  Filled 2017-10-04 (×9): qty 0.4

## 2017-10-04 MED ORDER — DEXTROSE 5 % IV SOLN
1.0000 g | INTRAVENOUS | Status: DC
  Administered 2017-10-04 – 2017-10-06 (×3): 1 g via INTRAVENOUS
  Filled 2017-10-04 (×3): qty 10

## 2017-10-04 MED ORDER — SODIUM CHLORIDE 0.9 % IV SOLN
INTRAVENOUS | Status: AC
  Administered 2017-10-04: 22:00:00 via INTRAVENOUS

## 2017-10-04 MED ORDER — ACETAMINOPHEN 650 MG RE SUPP: 650.0000 mg | Freq: Four times a day (QID) | RECTAL | Status: DC | PRN

## 2017-10-04 MED ORDER — PRAVASTATIN SODIUM 40 MG PO TABS
40.0000 mg | ORAL_TABLET | Freq: Every day | ORAL | Status: DC
  Administered 2017-10-04 – 2017-10-12 (×9): 40 mg via ORAL
  Filled 2017-10-04 (×9): qty 1

## 2017-10-04 MED ORDER — CLONAZEPAM 0.5 MG PO TABS
0.5000 mg | ORAL_TABLET | Freq: Three times a day (TID) | ORAL | Status: DC
  Filled 2017-10-04 (×2): qty 1

## 2017-10-04 MED ORDER — LOSARTAN POTASSIUM 50 MG PO TABS
50.0000 mg | ORAL_TABLET | Freq: Every day | ORAL | Status: DC
  Administered 2017-10-04 – 2017-10-12 (×9): 50 mg via ORAL
  Filled 2017-10-04 (×9): qty 1

## 2017-10-04 MED ORDER — ACETAMINOPHEN 325 MG PO TABS
650.0000 mg | ORAL_TABLET | Freq: Four times a day (QID) | ORAL | Status: DC | PRN
  Administered 2017-10-04 – 2017-10-11 (×5): 650 mg via ORAL
  Filled 2017-10-04 (×5): qty 2

## 2017-10-04 NOTE — Progress Notes (Signed)
Attempted to give patient scheduled depakote and klonopin. He spit both medications out and stated "I'm not taking it."

## 2017-10-04 NOTE — ED Notes (Signed)
SpO2 noted to be <90%. Pt somewhat more agitated. Speech continues to be difficult to understand. Attempted to place pt on [place], but pt wouldn't allow it and kept pulling tubing off immediately.

## 2017-10-04 NOTE — ED Notes (Signed)
Attempted report x1. 

## 2017-10-04 NOTE — ED Notes (Signed)
Sent message to pharmacy to send sinemet

## 2017-10-04 NOTE — H&P (Addendum)
TRH H&P   Patient Demographics:    Terry Taylor, is a 83 y.o. male  MRN: [id number]   DOB - 15-Nov-1928  Admit Date - 10/03/2017  Outpatient Primary MD for the patient is Remi Haggard, FNP  Referring MD/NP/PA: Quincy Carnes  Outpatient Specialists: *  Patient coming from: home  Chief Complaint  Patient presents with  . Failure To Thrive      HPI:    Terry Taylor  is a 83 y.o. male, w Stroke, Pafib, CAD, Hypertension, Copd on home o2, apparently had altered mental status at home.  There was concern for his wife giving him a whole bottle of seroquel and over medicating him.  Pt was brought to ED,    In ED, Wbc 7.9, Hgb 12.5, Plt 230,  Bun/creatinine 35/1.36,   ua => Wbc tntc,  CT brain pending .  CXR negative  Pt will be admitted for altered mental status secondary to UTI and possible overdose on risperdal.      Review of systems:    In addition to the HPI above, pt is a poor historian   No Fever-chills, No Headache, No changes with Vision or hearing, No problems swallowing food or Liquids, No Chest pain, Cough or Shortness of Breath, No Abdominal pain, No Nausea or Vommitting, Bowel movements are regular, No Blood in stool or Urine, No dysuria, No new skin rashes or bruises, No new joints pains-aches,  No new weakness, tingling, numbness in any extremity, No recent weight gain or loss, No polyuria, polydypsia or polyphagia, No significant Mental Stressors.  A full 10 point Review of Systems was done, except as stated above, all other Review of Systems were negative.   With Past History of the following :    Past Medical History:  Diagnosis Date  . Arthritis    "bad in his knees" (01/09/2017)  . Benign prostatic hypertrophy    hx  . Chronic airway obstruction, not elsewhere classified   . Chronic bronchitis ([place])   . Coronary atherosclerosis of  unspecified type of vessel, native or graft   . GERD (gastroesophageal reflux disease)   . New onset atrial fibrillation ([place]) 01/08/2017   [name] 01/08/2017  . On home oxygen therapy    "prn" (01/09/2017)  . Osteoarthrosis, unspecified whether generalized or localized, unspecified site   . Pneumonia    "more than once" (01/09/2017)  . Pure hypercholesterolemia    IIA  . Skin cancer of face   . Stroke [facility])    "they think he had a minor stroke 1-2 yr ago" (01/09/2017)  . Unspecified essential hypertension       Past Surgical History:  Procedure Laterality Date  . CERVICAL DISCECTOMY    . FRACTURE SURGERY    . SKIN CANCER DESTRUCTION Right    "face"  . TIBIA FRACTURE SURGERY Right    "he's got a pin  in there"      Social History:     Social History  Substance Use Topics  . Smoking status: Former Smoker    Years: 40.00    Types: Cigarettes    Quit date: 1979  . Smokeless tobacco: Never Used  . Alcohol use Yes     Comment: 01/09/2017 "nothing in the past several years"     Lives - at home  Mobility - unclear if able to walk   Family History :     Family History  Problem Relation Age of Onset  . Coronary artery disease Unknown        family hx  . Stroke Mother   . Stroke Father      Home Medications:   Prior to Admission medications   Medication Sig Start Date End Date Taking? Authorizing Provider  carbidopa-levodopa (SINEMET IR) 25-100 MG tablet Take 1 tablet by mouth 3 (three) times daily.   Yes [provider]  clonazePAM (KLONOPIN) 0.5 MG tablet Take 0.5 mg by mouth 3 (three) times daily.   Yes [provider]  divalproex (DEPAKOTE) 125 MG DR tablet Take 125 mg by mouth 2 (two) times daily.   Yes [provider]  finasteride (PROSCAR) 5 MG tablet Take 1 tablet (5 mg total) by mouth daily. 09/14/16  Yes Ghimire, Henreitta Leber, MD  isosorbide mononitrate (IMDUR) 30 MG 24 hr tablet Take 0.5 tablets (15 mg total) by mouth daily. 09/13/16   Yes Ghimire, Henreitta Leber, MD  losartan (COZAAR) 50 MG tablet Take 1 tablet (50 mg total) by mouth daily. 03/10/13  Yes Lottie Dawson A, MD  meloxicam (MOBIC) 7.5 MG tablet Take 7.5 mg by mouth daily.   Yes [provider]  pravastatin (PRAVACHOL) 40 MG tablet Take 40 mg by mouth daily.   Yes [provider]  risperiDONE (RISPERDAL) 0.25 MG tablet Take 0.25 mg by mouth 2 (two) times daily.   Yes [provider]  tamsulosin (FLOMAX) 0.4 MG CAPS Take 0.4 mg by mouth daily.   Yes [provider]  azithromycin (ZITHROMAX) 250 MG tablet Take 1 tablet (250 mg total) by mouth daily. Patient not taking: Reported on 10/04/2017 01/11/17   Debbe Odea, MD  oseltamivir (TAMIFLU) 30 MG capsule Take 1 capsule (30 mg total) by mouth 2 (two) times daily. Patient not taking: Reported on 10/04/2017 01/11/17   Debbe Odea, MD  predniSONE (DELTASONE) 20 MG tablet 40 mg tomorrow, 20 mg the next day and 10 mg the last day Patient not taking: Reported on 10/04/2017 01/11/17   Debbe Odea, MD     Allergies:     Allergies  Allergen Reactions  . Bee Venom Anaphylaxis     Physical Exam:   Vitals  Blood pressure (!) 165/97, pulse 68, temperature 98.4 F (36.9 C), temperature source Oral, resp. rate 14, height 6' (1.829 m), weight 74.4 kg (164 lb), SpO2 94 %.   1. General  lying in bed in NAD,    2. Normal affect and insight, Not Suicidal or Homicidal, Awake Alert, Oriented X 1.  3. No F.N deficits, ALL C.Nerves Intact, Strength 5/5 all 4 extremities, Sensation intact all 4 extremities, Plantars down going.  4. Ears and Eyes appear Normal, Conjunctivae clear, PERRLA. Moist Oral Mucosa.  5. Supple Neck, No JVD, No cervical lymphadenopathy appriciated, No Carotid Bruits.  6. Symmetrical Chest wall movement, Good air movement bilaterally, CTAB.  7. RRR, No Gallops, Rubs or Murmurs, No Parasternal Heave.  8. Positive  Bowel Sounds, Abdomen Soft, No tenderness, No  organomegaly appriciated,No rebound -guarding or rigidity.  9.  No Cyanosis, Normal Skin Turgor, No Skin Rash or Bruise.  10. Good muscle tone,  joints appear normal , no effusions, Normal ROM.  11. No Palpable Lymph Nodes in Neck or Axillae  Seems overall somnolent.    Data Review:    CBC  Recent Labs Lab 10/03/17 2220  WBC 7.9  HGB 12.5*  HCT 37.3*  PLT 230  MCV 85.4  MCH 28.6  MCHC 33.5  RDW 14.2  LYMPHSABS 1.7  MONOABS 0.3  EOSABS 0.1  BASOSABS 0.0   ------------------------------------------------------------------------------------------------------------------  Chemistries   Recent Labs Lab 10/03/17 2220  NA 135  K 4.2  CL 104  CO2 25  GLUCOSE 110*  BUN 35*  CREATININE 1.36*  CALCIUM 8.6*  AST 11*  ALT <5*  ALKPHOS 49  BILITOT 1.0   ------------------------------------------------------------------------------------------------------------------ estimated creatinine clearance is 40.3 mL/min (A) (by C-G formula based on SCr of 1.36 mg/dL (H)). ------------------------------------------------------------------------------------------------------------------ No results for input(s): TSH, T4TOTAL, T3FREE, THYROIDAB in the last 72 hours.  Invalid input(s): FREET3  Coagulation profile No results for input(s): INR, PROTIME in the last 168 hours. ------------------------------------------------------------------------------------------------------------------- No results for input(s): DDIMER in the last 72 hours. -------------------------------------------------------------------------------------------------------------------  Cardiac Enzymes No results for input(s): CKMB, TROPONINI, MYOGLOBIN in the last 168 hours.  Invalid input(s): CK ------------------------------------------------------------------------------------------------------------------    Component Value Date/Time   BNP 159.1 (H) 01/08/2017 1944      ---------------------------------------------------------------------------------------------------------------  Urinalysis    Component Value Date/Time   COLORURINE YELLOW 10/04/2017 0055   APPEARANCEUR CLOUDY (A) 10/04/2017 0055   APPEARANCEUR Clear 10/08/2014 0848   LABSPEC 1.017 10/04/2017 0055   LABSPEC 1.009 10/08/2014 0848   PHURINE 5.0 10/04/2017 0055   GLUCOSEU NEGATIVE 10/04/2017 0055   GLUCOSEU Negative 10/08/2014 0848   HGBUR LARGE (A) 10/04/2017 0055   BILIRUBINUR NEGATIVE 10/04/2017 0055   BILIRUBINUR Negative 10/08/2014 0848   KETONESUR 5 (A) 10/04/2017 0055   PROTEINUR 100 (A) 10/04/2017 0055   UROBILINOGEN 1.0 03/06/2013 1924   NITRITE POSITIVE (A) 10/04/2017 0055   LEUKOCYTESUR LARGE (A) 10/04/2017 0055   LEUKOCYTESUR Negative 10/08/2014 0848    ----------------------------------------------------------------------------------------------------------------   Imaging Results:    Dg Chest 2 View  Result Date: 10/03/2017 CLINICAL DATA:  Lethargy EXAM: CHEST  2 VIEW COMPARISON:  01/08/2017 FINDINGS: Low lung volumes with mild bibasilar atelectasis. No pleural effusion. No focal consolidation. Normal cardiomediastinal silhouette with atherosclerosis. No pneumothorax. IMPRESSION: Low lung volumes.  No focal infiltrate. Electronically Signed   By: Donavan Foil M.D.   On: 10/03/2017 23:41       Assessment & Plan:    Principal Problem:   Altered mental status Active Problems:   Overdose   UTI (urinary tract infection)   Anemia   Renal insufficiency    AMS likely secondary to UTI vs risperdal OD Check b12, tsh, esr, ana, rpr Check CT brain noncontrast UDS pending Monitor  UTI Awaiting urine culture Start rocephin 1gm iv qday  Renal insufficiency Hydrate with ns iv Check cmp in am  Anemia Check cbc in am  Hypertension Cont losartan  Bph Continue flomax/ finateride  ? Parkinsons  Continue sinemet  Anxiety/Mood disorder Continue  depakote Cont clonazepam      DVT Prophylaxis Lovenox - SCDs  AM Labs Ordered, also please review Full Orders  Family Communication: Admission, patients condition and plan of care including tests being ordered have been discussed with the patient  who indicate  understanding and agree with the plan and Code Status.  Code Status FULL CODE  Likely DC to  TBD  Condition GUARDED    Consults called: none  Admission status: inpatient  Time spent in minutes : 45    Jani Gravel M.D on 10/04/2017 at 3:21 AM  Between 7am to 7pm - Pager - [phone]   After 7pm go to [email or web address] - password [email or web address]  [facility] - Office  [phone]

## 2017-10-04 NOTE — Progress Notes (Signed)
CSW attempted to speak with pt at bedside. Pt's speech is very hard to understand therefore CSW reached out to pt's daughter [name] [phone]) to gather needed information. CSW left VM for Cindy to call CSW back. CSW will continue to assists with needs as presented.    Terry Taylor, MSW, [facility] [facility] Clinical Social Worker [phone]

## 2017-10-04 NOTE — ED Notes (Signed)
Pt pulled condom cath off.

## 2017-10-04 NOTE — ED Notes (Signed)
Patient confused, does not follow commands.  O2 saturation 88% on room air, patient refuses nasal canula stating "I dont need that shit" and pulls canula off.  MD aware of confusion and oxygen saturation, ABG in process.

## 2017-10-04 NOTE — ED Notes (Signed)
2cm skin tear noted on left forearm.  Wrapped in gauze

## 2017-10-04 NOTE — Clinical Social Work Note (Signed)
Clinical Social Work Assessment  Patient Details  Name: Terry Taylor MRN: [id number] Date of Birth: 1928/07/11  Date of referral:  10/04/17               Reason for consult:  Facility Placement                Permission sought to share information with:  Family Supports Permission granted to share information::  Yes, Verbal Permission Granted  Name::     Terry Taylor  (pt's daughter. )  Agency::     Relationship::     Contact Information:     Housing/Transportation Living arrangements for the past 2 months:  Single Family Home (with wife. ) Source of Information:  Adult Children Patient Interpreter Needed:  None Criminal Activity/Legal Involvement Pertinent to Current Situation/Hospitalization:  No - Comment as needed Significant Relationships:  Adult Children, Spouse Lives with:  Spouse Do you feel safe going back to the place where you live?  Yes Need for family participation in patient care:  Yes (Comment)  Care giving concerns:  CSW spoke with pt's daughter Terry Taylor at bedside. At this time Terry Taylor as well as family are concerned with pt's medical needs overall.    Social Worker assessment / plan:  CSW spoke with pt and pt's daughter at bedside. CSW was unable to understand pt due to speech being unclear. Pt's daughter informed CSW that pt is from home with pt's wife. Pt's wife and children are the primary supports for pt at this time. CSW was informed that the family has sought placement with SNF and hospice in the past but no luck. Pt's wife is reluctant to have [place] as Terry Taylor mentioned that "she wants to be the one to take care of him".   Employment status:  Retired Nurse, adult PT Recommendations:  Not assessed at this time Information / Referral to community resources:  [place]  Patient/Family's Response to care:  Pt's daughter is understanding and agreeable to plan of care at this time.   Patient/Family's Understanding of and Emotional  Response to Diagnosis, Current Treatment, and Prognosis:  No further questions or concerns have been presented to CSW at this time.   Emotional Assessment Appearance:  Appears stated age Attitude/Demeanor/Rapport:    Affect (typically observed):  Calm, Pleasant Orientation:   (CSW spoke with pt's daughter due to pt's speech being unclear. ) Alcohol / Substance use:  Not Applicable Psych involvement (Current and /or in the community):  No (Comment)  Discharge Needs  Concerns to be addressed:  Discharge Planning Concerns Readmission within the last 30 days:  No Current discharge risk:  None Barriers to Discharge:  No Barriers Identified   Wetzel Bjornstad, [place] 10/04/2017, 10:15 AM

## 2017-10-04 NOTE — Progress Notes (Signed)
Patient becomes physically combative when attempts are made to replace his telemetry electrodes. He refuses to wear mittens as well. On-call provider made aware.

## 2017-10-04 NOTE — Progress Notes (Signed)
Patient seen and examined this morning, admitted overnight by Dr. Maudie Mercury.  H&P reviewed, agree with the assessment and plan.  In brief, this is an 83 year old male with prior history of CVA, paroxysmal A. fib, coronary artery disease, hypertension, COPD, who was brought in with acute encephalopathy and with concerns about patient being overmedicated by his wife.  No family at bedside when I evaluated, per EDP note it appears that he has been started on 3 different antipsychotic medication the last few weeks, Depakote, risperidone and potentially Seroquel.  It is unclear how much of each patient took   Acute encephalopathy -Likely due to combination of UTI as well as medications at home.  I am not sure about patient's baseline and whether he has underlying dementia as well.  Apparently patient has been having a progressive decline over the last several weeks.  Urinary tract infection -Empiric antibiotics ceftriaxone, urine cultures are pending  Paroxysmal A. fib -Noted in January 2018, on aspirin due to not a good candidate for anticoagulation due to fragility and high fall risk -Appears to be in sinus however EKG was with poor tracing due to patient's agitation, rate in the 70s  Chronic kidney disease stage III -Baseline creatinine 1.1-1.4, currently at baseline -Discontinue meloxicam  Anemia of chronic disease -Hemoglobin stable  Hypertension -Continue losartan  BPH -Continue Flomax/finasteride  Parkinson's -Continue Sinemet  Anxiety/mood disorder -Continue Depakote, clonazepam  Hyperlipidemia -Continue pravastatin   Jalon Squier M. Cruzita Lederer, MD [facility] [phone]  If 7PM-7AM, please contact night-coverage [email or web address] Password [email or web address]

## 2017-10-04 NOTE — ED Notes (Signed)
Contacted lab about unresulted valproic acid level. Tech agreed to run it off the dark green tube they had on hand.

## 2017-10-05 ENCOUNTER — Inpatient Hospital Stay (HOSPITAL_COMMUNITY): Payer: Medicare HMO

## 2017-10-05 ENCOUNTER — Encounter (HOSPITAL_COMMUNITY): Payer: Self-pay

## 2017-10-05 DIAGNOSIS — T50901D Poisoning by unspecified drugs, medicaments and biological substances, accidental (unintentional), subsequent encounter: Secondary | ICD-10-CM

## 2017-10-05 LAB — BASIC METABOLIC PANEL
ANION GAP: 13 (ref 5–15)
BUN: 23 mg/dL — ABNORMAL HIGH (ref 6–20)
CALCIUM: 8.5 mg/dL — AB (ref 8.9–10.3)
CO2: 21 mmol/L — ABNORMAL LOW (ref 22–32)
CREATININE: 1.18 mg/dL (ref 0.61–1.24)
Chloride: 104 mmol/L (ref 101–111)
GFR, EST NON AFRICAN AMERICAN: 54 mL/min — AB (ref >60)
Glucose, Bld: 75 mg/dL (ref 65–99)
Potassium: 4.2 mmol/L (ref 3.5–5.1)
SODIUM: 138 mmol/L (ref 135–145)

## 2017-10-05 LAB — CBC
HCT: 39.8 % (ref 39.0–52.0)
HEMOGLOBIN: 13.5 g/dL (ref 13.0–17.0)
MCH: 29.2 pg (ref 26.0–34.0)
MCHC: 33.9 g/dL (ref 30.0–36.0)
MCV: 86.1 fL (ref 78.0–100.0)
PLATELETS: 237 10*3/uL (ref 150–400)
RBC: 4.62 MIL/uL (ref 4.22–5.81)
RDW: 14 % (ref 11.5–15.5)
WBC: 7.4 10*3/uL (ref 4.0–10.5)

## 2017-10-05 MED ORDER — INFLUENZA VAC SPLIT HIGH-DOSE 0.5 ML IM SUSY
0.5000 mL | PREFILLED_SYRINGE | INTRAMUSCULAR | Status: AC
  Administered 2017-10-06: 0.5 mL via INTRAMUSCULAR
  Filled 2017-10-05: qty 0.5

## 2017-10-05 MED ORDER — DIVALPROEX SODIUM 125 MG PO DR TAB
125.0000 mg | DELAYED_RELEASE_TABLET | Freq: Every day | ORAL | Status: DC
  Administered 2017-10-06 – 2017-10-10 (×5): 125 mg via ORAL
  Filled 2017-10-05 (×6): qty 1

## 2017-10-05 MED ORDER — ORAL CARE MOUTH RINSE
15.0000 mL | Freq: Two times a day (BID) | OROMUCOSAL | Status: DC
  Administered 2017-10-05 – 2017-10-11 (×12): 15 mL via OROMUCOSAL

## 2017-10-05 MED ORDER — SODIUM CHLORIDE 0.9 % IV SOLN
INTRAVENOUS | Status: DC
  Administered 2017-10-05 – 2017-10-12 (×7): via INTRAVENOUS

## 2017-10-05 MED ORDER — CLONAZEPAM 0.5 MG PO TABS
0.5000 mg | ORAL_TABLET | Freq: Two times a day (BID) | ORAL | Status: DC | PRN
  Administered 2017-10-06 – 2017-10-07 (×2): 0.5 mg via ORAL
  Filled 2017-10-05 (×2): qty 1

## 2017-10-05 MED ORDER — NAPHAZOLINE-GLYCERIN 0.012-0.2 % OP SOLN
1.0000 [drp] | Freq: Four times a day (QID) | OPHTHALMIC | Status: DC | PRN
  Administered 2017-10-09 – 2017-10-10 (×2): 2 [drp] via OPHTHALMIC
  Filled 2017-10-05 (×2): qty 15

## 2017-10-05 MED ORDER — SODIUM CHLORIDE 0.9 % IV SOLN: INTRAVENOUS | Status: DC

## 2017-10-05 MED ORDER — GADOBENATE DIMEGLUMINE 529 MG/ML IV SOLN
15.0000 mL | Freq: Once | INTRAVENOUS | Status: AC
  Administered 2017-10-05: 15 mL via INTRAVENOUS

## 2017-10-05 MED ORDER — LORAZEPAM 2 MG/ML IJ SOLN: 0.5000 mg | Freq: Once | INTRAMUSCULAR | Status: DC

## 2017-10-05 NOTE — Plan of Care (Signed)
Problem: Safety: Goal: Ability to remain free from injury will improve Outcome: Progressing Bed in low position, bed alarm on, family at bedside, non skid socks on, call bell in reach

## 2017-10-05 NOTE — Progress Notes (Signed)
PROGRESS NOTE  Terry Taylor PPI:[id number] DOB: [birth year] DOA: 10/03/2017 PCP: Remi Haggard, FNP   LOS: 1 day   Brief Narrative / Interim history:  83 year old male with prior history of CVA, paroxysmal A. fib, coronary artery disease, hypertension, COPD, who was brought in with acute encephalopathy and with concerns about patient being overmedicated by his wife  Assessment & Plan: Principal Problem:   Altered mental status Active Problems:   Overdose   UTI (urinary tract infection)   Anemia   Renal insufficiency   Acute encephalopathy -Likely due to combination of UTI as well as medications at home.  I am not sure about patient's baseline but probably based on prior notes during his hospitalizations in the past he has underlying dementia as well. -Met with the daughter at bedside and had an extensive conversation.  She is quite concerned that her mother (patient's wife) has been overmedicating the patient.  She feels like his medication regimen has been changed significantly in the last several weeks, it appears that he has placed on Depakote, Klonopin, Risperdal, and she is not sure about the medical need for these medications.  She is also not sure whether the patient has Parkinson's either.  She tells me that there have been marital problems for some time between the patient and his wife, and feels like she is overmedicating the patient to keep him calm. Daughter is concerned that patient has become too lethargic, confused, and not recognizing other family members, more so in the last 3-4 weeks.  He has been having some concerning features for dementia per daughter for several months. -Continue antibiotics for UTI, stop Risperdal, decrease doses of Depakote and Klonopin, obtain an MRI -TSH normal 1.3, vitamin B12 561, RPR negative  Urinary tract infection -Empiric antibiotics ceftriaxone, urine cultures show E. coli, sensitivities pending  Paroxysmal A. fib -Noted in  January 2018, on aspirin due to not a good candidate for anticoagulation due to fragility and high fall risk -Appears to be in sinus however EKG was with poor tracing due to patient's agitation, he is rate controlled  Chronic kidney disease stage III -Baseline creatinine 1.1-1.4, currently at baseline, 1.18 this morning -Discontinue meloxicam  Anemia of chronic disease -Hemoglobin stable  Hypertension -Continue losartan  BPH -Continue Flomax/finasteride  Parkinson's -Continue Sinemet, daughter is questioning this diagnosis, probably once acute encephalopathy episode resolves this can be evaluated by neurology as an outpatient  Anxiety/mood disorder -Continue Depakote, clonazepam  Hyperlipidemia -Continue pravastatin  Probable dementia -Most likely, this has been undiagnosed but based on talking to the daughter as well as chart review -Once acute encephalopathy episode resolved, may benefit from outpatient neuropsychiatric testing   DVT prophylaxis: Lovenox Code Status: Full code (confirmed with daughter on 10/6) Family Communication: Discussed with daughter at bedside Disposition Plan: Remain in stepdown today  Consultants:   None  Procedures:   None   Antimicrobials:  Ceftriaxone 10/5 >>  Subjective: -confused, does not answer questions consistently, however says no to chest pain, shortness of breath, no abdominal pain, nausea or vomiting.   Objective: Vitals:   10/04/17 1823 10/05/17 0022 10/05/17 0437 10/05/17 0800  BP: 117/71 131/73 120/71 (!) 151/80  Pulse: 78 75 64 79  Resp: '12 16 16 18  '$ Temp: 98 F (36.7 C) 97.8 F (36.6 C) 97.8 F (36.6 C) 98.9 F (37.2 C)  TempSrc: Oral Oral Axillary Axillary  SpO2: 96% 97% 93% 93%  Weight:   72.4 kg (159 lb 9.8 oz)   Height:  Intake/Output Summary (Last 24 hours) at 10/05/17 1225 Last data filed at 10/05/17 0900  Gross per 24 hour  Intake             1240 ml  Output              100 ml    Net             1140 ml   Filed Weights   10/03/17 2123 10/05/17 0437  Weight: 74.4 kg (164 lb) 72.4 kg (159 lb 9.8 oz)    Examination:  Constitutional: NAD, disheveled Caucasian male Eyes: PERRL, left eye ectropion ENMT: Mucous membranes are moist. Neck: normal, supple Respiratory: clear to auscultation bilaterally, no wheezing, no crackles. Normal respiratory effort. No accessory muscle use.  Cardiovascular: Regular rate and rhythm, no murmurs / rubs / gallops. No LE edema. 2+ pedal pulses.  Abdomen: no tenderness. Bowel sounds positive.  Musculoskeletal: Decreased muscle tone Skin: no rashes Neurologic: Moves all 4 extremities, does not follow commands but does not appear to exhibit focal findings Psychiatric: Alert to person only   Data Reviewed: I have independently reviewed following labs and imaging studies   CBC:  Recent Labs Lab 10/03/17 2220 10/04/17 0438 10/05/17 0854  WBC 7.9 8.1 7.4  NEUTROABS 5.8  --   --   HGB 12.5* 12.8* 13.5  HCT 37.3* 38.5* 39.8  MCV 85.4 86.1 86.1  PLT 230 216 237   Basic Metabolic Panel:  Recent Labs Lab 10/03/17 2220 10/04/17 0438 10/05/17 0854  NA 135 137 138  K 4.2 4.2 4.2  CL 104 101 104  CO2 25 23 21*  GLUCOSE 110* 95 75  BUN 35* 32* 23*  CREATININE 1.36* 1.38* 1.18  CALCIUM 8.6* 8.7* 8.5*   GFR: Estimated Creatinine Clearance: 45.2 mL/min (by C-G formula based on SCr of 1.18 mg/dL). Liver Function Tests:  Recent Labs Lab 10/03/17 2220 10/04/17 0438  AST 11* 12*  ALT <5* <5*  ALKPHOS 49 48  BILITOT 1.0 1.2  PROT 5.7* 6.2*  ALBUMIN 3.2* 3.3*   No results for input(s): LIPASE, AMYLASE in the last 168 hours. No results for input(s): AMMONIA in the last 168 hours. Coagulation Profile: No results for input(s): INR, PROTIME in the last 168 hours. Cardiac Enzymes: No results for input(s): CKTOTAL, CKMB, CKMBINDEX, TROPONINI in the last 168 hours. BNP (last 3 results) No results for input(s): PROBNP in the  last 8760 hours. HbA1C: No results for input(s): HGBA1C in the last 72 hours. CBG: No results for input(s): GLUCAP in the last 168 hours. Lipid Profile: No results for input(s): CHOL, HDL, LDLCALC, TRIG, CHOLHDL, LDLDIRECT in the last 72 hours. Thyroid Function Tests:  Recent Labs  10/04/17 0438  TSH 1.319   Anemia Panel:  Recent Labs  10/04/17 0438  VITAMINB12 561   Urine analysis:    Component Value Date/Time   COLORURINE YELLOW 10/04/2017 0055   APPEARANCEUR CLOUDY (A) 10/04/2017 0055   APPEARANCEUR Clear 10/08/2014 0848   LABSPEC 1.017 10/04/2017 0055   LABSPEC 1.009 10/08/2014 0848   PHURINE 5.0 10/04/2017 0055   GLUCOSEU NEGATIVE 10/04/2017 0055   GLUCOSEU Negative 10/08/2014 0848   HGBUR LARGE (A) 10/04/2017 0055   BILIRUBINUR NEGATIVE 10/04/2017 0055   BILIRUBINUR Negative 10/08/2014 0848   KETONESUR 5 (A) 10/04/2017 0055   PROTEINUR 100 (A) 10/04/2017 0055   UROBILINOGEN 1.0 03/06/2013 1924   NITRITE POSITIVE (A) 10/04/2017 0055   LEUKOCYTESUR LARGE (A) 10/04/2017 0055   LEUKOCYTESUR Negative 10/08/2014  0848   Sepsis Labs: Invalid input(s): PROCALCITONIN, LACTICIDVEN  Recent Results (from the past 240 hour(s))  Urine culture     Status: Abnormal (Preliminary result)   Collection Time: 10/04/17 12:55 AM  Result Value Ref Range Status   Specimen Description URINE, RANDOM  Final   Special Requests NONE  Final   Culture >=100,000 COLONIES/mL ESCHERICHIA COLI (A)  Final   Report Status PENDING  Incomplete  MRSA PCR Screening     Status: None   Collection Time: 10/04/17  6:30 PM  Result Value Ref Range Status   MRSA by PCR NEGATIVE NEGATIVE Final    Comment:        The GeneXpert MRSA Assay (FDA approved for NASAL specimens only), is one component of a comprehensive MRSA colonization surveillance program. It is not intended to diagnose MRSA infection nor to guide or monitor treatment for MRSA infections.       Radiology Studies: Dg Chest 2  View  Result Date: 10/03/2017 CLINICAL DATA:  Lethargy EXAM: CHEST  2 VIEW COMPARISON:  01/08/2017 FINDINGS: Low lung volumes with mild bibasilar atelectasis. No pleural effusion. No focal consolidation. Normal cardiomediastinal silhouette with atherosclerosis. No pneumothorax. IMPRESSION: Low lung volumes.  No focal infiltrate. Electronically Signed   By: Donavan Foil M.D.   On: 10/03/2017 23:41     Scheduled Meds: . carbidopa-levodopa  1 tablet Oral TID  . [START ON 10/06/2017] divalproex  125 mg Oral Daily  . enoxaparin (LOVENOX) injection  40 mg Subcutaneous Daily  . finasteride  5 mg Oral Daily  . [START ON 10/06/2017] Influenza vac split quadrivalent PF  0.5 mL Intramuscular Tomorrow-1000  . isosorbide mononitrate  15 mg Oral Daily  . losartan  50 mg Oral Daily  . mouth rinse  15 mL Mouth Rinse BID  . pravastatin  40 mg Oral Daily  . tamsulosin  0.4 mg Oral Daily   Continuous Infusions: . cefTRIAXone (ROCEPHIN)  IV Stopped (10/05/17 0446)       Time spent: 40 minutes, more than 50% at bedside involved in discussions/counseling with her daughter as above.    Marzetta Board, MD, PhD [facility] Pager [phone]  If 7PM-7AM, please contact night-coverage [email or web address] Password [email or web address] 10/05/2017, 12:25 PM

## 2017-10-05 NOTE — Progress Notes (Signed)
Dr. Cruzita Lederer paged regarding family request for eye drops for lubrication for patient. Awaiting call back.

## 2017-10-05 NOTE — Progress Notes (Signed)
Dr. Cruzita Lederer made aware of pt's bp 84/55. Pt is asymptomatic. MD to order IVF.

## 2017-10-06 LAB — COMPREHENSIVE METABOLIC PANEL
ALBUMIN: 2.9 g/dL — AB (ref 3.5–5.0)
ALT: 11 U/L — ABNORMAL LOW (ref 17–63)
AST: 12 U/L — AB (ref 15–41)
Alkaline Phosphatase: 46 U/L (ref 38–126)
Anion gap: 10 (ref 5–15)
BUN: 27 mg/dL — AB (ref 6–20)
CHLORIDE: 107 mmol/L (ref 101–111)
CO2: 22 mmol/L (ref 22–32)
Calcium: 8.3 mg/dL — ABNORMAL LOW (ref 8.9–10.3)
Creatinine, Ser: 1.28 mg/dL — ABNORMAL HIGH (ref 0.61–1.24)
GFR calc Af Amer: 56 mL/min — ABNORMAL LOW (ref >60)
GFR, EST NON AFRICAN AMERICAN: 49 mL/min — AB (ref >60)
Glucose, Bld: 108 mg/dL — ABNORMAL HIGH (ref 65–99)
POTASSIUM: 3.9 mmol/L (ref 3.5–5.1)
SODIUM: 139 mmol/L (ref 135–145)
Total Bilirubin: 0.7 mg/dL (ref 0.3–1.2)
Total Protein: 5.4 g/dL — ABNORMAL LOW (ref 6.5–8.1)

## 2017-10-06 LAB — CBC
HCT: 39.2 % (ref 39.0–52.0)
Hemoglobin: 12.9 g/dL — ABNORMAL LOW (ref 13.0–17.0)
MCH: 28.3 pg (ref 26.0–34.0)
MCHC: 32.9 g/dL (ref 30.0–36.0)
MCV: 86 fL (ref 78.0–100.0)
PLATELETS: 230 10*3/uL (ref 150–400)
RBC: 4.56 MIL/uL (ref 4.22–5.81)
RDW: 14.2 % (ref 11.5–15.5)
WBC: 7.9 10*3/uL (ref 4.0–10.5)

## 2017-10-06 LAB — URINE CULTURE

## 2017-10-06 MED ORDER — CEPHALEXIN 500 MG PO CAPS
500.0000 mg | ORAL_CAPSULE | Freq: Three times a day (TID) | ORAL | Status: DC
  Administered 2017-10-06 – 2017-10-10 (×12): 500 mg via ORAL
  Filled 2017-10-06 (×12): qty 1

## 2017-10-06 NOTE — Evaluation (Signed)
Clinical/Bedside Swallow Evaluation Patient Details  Name: Terry Taylor MRN: [id number] Date of Birth: 1928/11/30  Today's Date: 10/06/2017 Time: SLP Start Time (ACUTE ONLY): 1140 SLP Stop Time (ACUTE ONLY): 1200 SLP Time Calculation (min) (ACUTE ONLY): 20 min  Past Medical History:  Past Medical History:  Diagnosis Date  . Arthritis    "bad in his knees" (01/09/2017)  . Benign prostatic hypertrophy    hx  . Chronic airway obstruction, not elsewhere classified   . Chronic bronchitis ([place])   . Coronary atherosclerosis of unspecified type of vessel, native or graft   . GERD (gastroesophageal reflux disease)   . New onset atrial fibrillation ([place]) 01/08/2017   Terry Taylor 01/08/2017  . On home oxygen therapy    "prn" (01/09/2017)  . Osteoarthrosis, unspecified whether generalized or localized, unspecified site   . Pneumonia    "more than once" (01/09/2017)  . Pure hypercholesterolemia    IIA  . Skin cancer of face   . Stroke [facility])    "they think he had a minor stroke 1-2 yr ago" (01/09/2017)  . Unspecified essential hypertension    Past Surgical History:  Past Surgical History:  Procedure Laterality Date  . CERVICAL DISCECTOMY    . FRACTURE SURGERY    . SKIN CANCER DESTRUCTION Right    "face"  . TIBIA FRACTURE SURGERY Right    "he's got a pin in there"   HPI:  ClarenceSummersis a 83 y.o.male,w Stroke, Pafib, CAD, Hypertension, Copd on home o2, apparently had altered mental status at home. There was concern for his wife giving him a whole bottle of seroquel and over medicated him. Pt was brought to ED.  In ED, Wbc 7.9, Hgb 12.5, Plt 230, Bun/creatinine 35/1.36, ua =>Wbc tntc, CT brain pending . CXR negative Pt will be admitted for altered mental status secondary to UTI and possible overdose.  MRI was normal for age.  Chest xray showing low lung volumes but no focal infilitrates.      Assessment / Plan / Recommendation Clinical Impression  Clinical swallowing  evaluation was completed using thin liquids, pureed material and dual textured solids in setting for admission with failure to thrive. Most recent chest xray is negative for focal infilitrates.  The patient's daughter who was present reported decline in intake for a few days prior to admission but that prior to that he had a healthy appetite.  Nursing reported no issues with his breakfast tray except with regular solids.  Limited oral mechanism exam was completed as the patient was unable to follow all commands.  Good lingual protrusion, laterilization and depression was noted.  He struggled to elevate his tongue.  He was unable to perform any of the labial tasks and strength was unable to be assessed.  He also has lower dentures that were not available, however, his daughter reported that he does not like to wear them secondary to discomfort.  He presented with a possible oral dysphagia.  The oral phase was characterized by some oral holding and delayed oral transit for dual textured solids.  Swallow trigger appeared to be timely and hyo-laryngeal excursion was appreciated to palpation.  Consistent overt s/s of aspiration were not seen.  Cough x 1 was seen at end of exam following thin liquids via straw sips following dual textured solids.  Recommend a ground dysphagia 2 diet with thin liquids.  ST will follow up for possible diet advancement and need for instrumental exam.     SLP Visit Diagnosis: Dysphagia, oral phase (  R13.11)    Aspiration Risk  Mild aspiration risk    Diet Recommendation   Dysphagia 2 with thin  Medication Administration: Whole meds with puree    Other  Recommendations Oral Care Recommendations: Oral care BID   Follow up Recommendations Other (comment) (TBD)      Frequency and Duration min 2x/week  2 weeks       Prognosis Prognosis for Safe Diet Advancement: Good      Swallow Study   General Date of Onset: 10/03/17 HPI: ClarenceSummersis a 83 y.o.male,w Stroke,  Pafib, CAD, Hypertension, Copd on home o2, apparently had altered mental status at home. There was concern for his wife giving him a whole bottle of seroquel and over medicated him. Pt was brought to ED.  In ED, Wbc 7.9, Hgb 12.5, Plt 230, Bun/creatinine 35/1.36, ua =>Wbc tntc, CT brain pending . CXR negative Pt will be admitted for altered mental status secondary to UTI and possible overdose.  MRI was normal for age.  Chest xray showing low lung volumes but no focal infilitrates.    Type of Study: Bedside Swallow Evaluation Previous Swallow Assessment: None noted at [facility].   Diet Prior to this Study: Regular;Thin liquids Temperature Spikes Noted: No Respiratory Status: Nasal cannula History of Recent Intubation: No Behavior/Cognition: Alert;Cooperative Oral Cavity Assessment: Within Functional Limits Oral Care Completed by SLP: Recent completion by staff Oral Cavity - Dentition: Dentures, bottom;Dentures, not available Vision: Functional for self-feeding Self-Feeding Abilities: Needs assist Patient Positioning: Upright in chair Baseline Vocal Quality: Normal;Low vocal intensity Volitional Cough: Weak Volitional Swallow: Able to elicit    Oral/Motor/Sensory Function Overall Oral Motor/Sensory Function:  (Limited assessment due to issues following commands.  )   Ice Chips Ice chips: Not tested   Thin Liquid Thin Liquid: Impaired Presentation: Cup;Spoon;Straw;Self Fed Oral Phase Impairments: Impaired mastication Oral Phase Functional Implications: Oral holding Pharyngeal  Phase Impairments: Cough - Immediate (Only seen 1x following straw sips)    Nectar Thick Nectar Thick Liquid: Not tested   Honey Thick Honey Thick Liquid: Not tested   Puree Puree: Within functional limits Presentation: Spoon   Solid   GO   Solid: Impaired Oral Phase Impairments: Impaired mastication Oral Phase Functional Implications: Impaired mastication;Prolonged oral transit        Shelly Flatten,  MA, CCC-SLP Acute Rehab SLP [phone] Lamar Sprinkles 10/06/2017,12:49 PM

## 2017-10-06 NOTE — Progress Notes (Signed)
Report called pt transferring to 5C10 via bed with belongings and daughter cindy. telesitter monitor sent with pt also.

## 2017-10-06 NOTE — Progress Notes (Signed)
PROGRESS NOTE  Terry Taylor PTW:[id number] DOB: 24-May-1928 DOA: 10/03/2017 PCP: Remi Haggard, FNP   LOS: 2 days   Brief Narrative / Interim history:  83 year old male with prior history of CVA, paroxysmal A. fib, coronary artery disease, hypertension, COPD, who was brought in with acute encephalopathy and with concerns about patient being overmedicated by his wife  Assessment & Plan: Principal Problem:   Altered mental status Active Problems:   Overdose   UTI (urinary tract infection)   Anemia   Renal insufficiency   Acute encephalopathy -Likely due to combination of UTI as well as medications at home.  I am not sure about patient's baseline but probably based on prior notes during his hospitalizations in the past he has underlying dementia as well. -Met with the daughter at bedside and had an extensive conversation on 10/6 and 10/7.  She is quite concerned that her mother (patient's wife) has been overmedicating the patient.  She feels like his medication regimen has been changed significantly in the last several weeks, it appears that he has placed on Depakote, Klonopin, Risperdal, and she is not sure about the medical need for these medications.  She is also not sure whether the patient has Parkinson's either.  She tells me that there have been marital problems for some time between the patient and his wife, and feels like she is overmedicating the patient to keep him calm. Daughter is concerned that patient has become too lethargic, confused, and not recognizing other family members, more so in the last 3-4 weeks.  He has been having some concerning features for dementia per daughter for several months. -Continue antibiotics for UTI, stop Risperdal, decrease doses of Depakote and Klonopin -TSH normal 1.3, vitamin B12 561, RPR negative  -MRI of the brain showed atrophy but no acute findings  -patient is improving  Urinary tract infection -Initially placed on empiric  antibiotics with ceftriaxone, urine cultures showed E. coli which is pansensitive.  Narrowed to Keflex today.  Paroxysmal A. fib -Noted in January 2018, on aspirin due to not a good candidate for anticoagulation due to fragility and high fall risk -Appears to be in sinus now  Chronic kidney disease stage III -Baseline creatinine 1.1-1.4, currently at baseline, 1.18 this morning -Discontinue meloxicam  Anemia of chronic disease -Hemoglobin stable  Hypertension -Continue losartan  BPH -Continue Flomax/finasteride  Parkinson's -Continue Sinemet, daughter is questioning this diagnosis, probably once acute encephalopathy episode resolves this can be evaluated by neurology as an outpatient -Has some cogwheel rigidity and tremor at rest which goes away with intentional movement, suggesting true underlying Parkinson's disease.  This was discussed with daughter  Anxiety/mood disorder -Continue Depakote, clonazepam  Hyperlipidemia -Continue pravastatin  Probable dementia -Most likely, this has been undiagnosed but based on talking to the daughter as well as chart review -Once acute encephalopathy episode resolved, may benefit from outpatient neuropsychiatric testing   DVT prophylaxis: Lovenox Code Status: Full code (confirmed with daughter on 10/6) Family Communication: Discussed with daughter at bedside Disposition Plan: Transfer to [place]  Consultants:   None  Procedures:   None   Antimicrobials:  Ceftriaxone 10/5 >> 10/7  Keflex 10/7 >>  Subjective: - no chest pain, shortness of breath, no abdominal pain, nausea or vomiting.   Objective: Vitals:   10/06/17 0013 10/06/17 0406 10/06/17 0746 10/06/17 1156  BP: (!) 164/90 (!) 182/89 (!) 179/89 91/60  Pulse:  68 68 73  Resp:  '17 13 15  '$ Temp: 98.1 F (36.7 C) 98.1  F (36.7 C) 98.3 F (36.8 C) 98.7 F (37.1 C)  TempSrc: Oral Oral Axillary Axillary  SpO2:  100% 98% 96%  Weight:      Height:         Intake/Output Summary (Last 24 hours) at 10/06/17 1220 Last data filed at 10/06/17 1156  Gross per 24 hour  Intake          1838.34 ml  Output             1000 ml  Net           838.34 ml   Filed Weights   10/03/17 2123 10/05/17 0437 10/05/17 1700  Weight: 74.4 kg (164 lb) 72.4 kg (159 lb 9.8 oz) 69.9 kg (154 lb 1.6 oz)    Examination:  Constitutional: NAD Eyes: PERRL, left eye ectropion Respiratory: CTA biL, no wheezing, no crackles  Cardiovascular: RRR no MRG. No edema Abdomen: Soft, nondistended, nontender.  Bowel sounds positive Musculoskeletal: Decreased muscle tone Neurologic: Nonfocal Psychiatric: Alert to person only  Data Reviewed: I have independently reviewed following labs and imaging studies   CBC:  Recent Labs Lab 10/03/17 2220 10/04/17 0438 10/05/17 0854 10/06/17 0231  WBC 7.9 8.1 7.4 7.9  NEUTROABS 5.8  --   --   --   HGB 12.5* 12.8* 13.5 12.9*  HCT 37.3* 38.5* 39.8 39.2  MCV 85.4 86.1 86.1 86.0  PLT 230 216 237 024   Basic Metabolic Panel:  Recent Labs Lab 10/03/17 2220 10/04/17 0438 10/05/17 0854 10/06/17 0231  NA 135 137 138 139  K 4.2 4.2 4.2 3.9  CL 104 101 104 107  CO2 25 23 21* 22  GLUCOSE 110* 95 75 108*  BUN 35* 32* 23* 27*  CREATININE 1.36* 1.38* 1.18 1.28*  CALCIUM 8.6* 8.7* 8.5* 8.3*   GFR: Estimated Creatinine Clearance: 40.2 mL/min (A) (by C-G formula based on SCr of 1.28 mg/dL (H)). Liver Function Tests:  Recent Labs Lab 10/03/17 2220 10/04/17 0438 10/06/17 0231  AST 11* 12* 12*  ALT <5* <5* 11*  ALKPHOS 49 48 46  BILITOT 1.0 1.2 0.7  PROT 5.7* 6.2* 5.4*  ALBUMIN 3.2* 3.3* 2.9*   No results for input(s): LIPASE, AMYLASE in the last 168 hours. No results for input(s): AMMONIA in the last 168 hours. Coagulation Profile: No results for input(s): INR, PROTIME in the last 168 hours. Cardiac Enzymes: No results for input(s): CKTOTAL, CKMB, CKMBINDEX, TROPONINI in the last 168 hours. BNP (last 3 results) No  results for input(s): PROBNP in the last 8760 hours. HbA1C: No results for input(s): HGBA1C in the last 72 hours. CBG: No results for input(s): GLUCAP in the last 168 hours. Lipid Profile: No results for input(s): CHOL, HDL, LDLCALC, TRIG, CHOLHDL, LDLDIRECT in the last 72 hours. Thyroid Function Tests:  Recent Labs  10/04/17 0438  TSH 1.319   Anemia Panel:  Recent Labs  10/04/17 0438  VITAMINB12 561   Urine analysis:    Component Value Date/Time   COLORURINE YELLOW 10/04/2017 0055   APPEARANCEUR CLOUDY (A) 10/04/2017 0055   APPEARANCEUR Clear 10/08/2014 0848   LABSPEC 1.017 10/04/2017 0055   LABSPEC 1.009 10/08/2014 0848   PHURINE 5.0 10/04/2017 0055   GLUCOSEU NEGATIVE 10/04/2017 0055   GLUCOSEU Negative 10/08/2014 0848   HGBUR LARGE (A) 10/04/2017 0055   BILIRUBINUR NEGATIVE 10/04/2017 0055   BILIRUBINUR Negative 10/08/2014 0848   KETONESUR 5 (A) 10/04/2017 0055   PROTEINUR 100 (A) 10/04/2017 0055   UROBILINOGEN 1.0 03/06/2013 1924  NITRITE POSITIVE (A) 10/04/2017 0055   LEUKOCYTESUR LARGE (A) 10/04/2017 0055   LEUKOCYTESUR Negative 10/08/2014 0848   Sepsis Labs: Invalid input(s): PROCALCITONIN, LACTICIDVEN  Recent Results (from the past 240 hour(s))  Urine culture     Status: Abnormal   Collection Time: 10/04/17 12:55 AM  Result Value Ref Range Status   Specimen Description URINE, RANDOM  Final   Special Requests NONE  Final   Culture >=100,000 COLONIES/mL ESCHERICHIA COLI (A)  Final   Report Status 10/06/2017 FINAL  Final   Organism ID, Bacteria ESCHERICHIA COLI (A)  Final      Susceptibility   Escherichia coli - MIC*    AMPICILLIN <=2 SENSITIVE Sensitive     CEFAZOLIN <=4 SENSITIVE Sensitive     CEFTRIAXONE <=1 SENSITIVE Sensitive     CIPROFLOXACIN <=0.25 SENSITIVE Sensitive     GENTAMICIN <=1 SENSITIVE Sensitive     IMIPENEM <=0.25 SENSITIVE Sensitive     NITROFURANTOIN <=16 SENSITIVE Sensitive     TRIMETH/SULFA <=20 SENSITIVE Sensitive      AMPICILLIN/SULBACTAM <=2 SENSITIVE Sensitive     PIP/TAZO <=4 SENSITIVE Sensitive     Extended ESBL NEGATIVE Sensitive     * >=100,000 COLONIES/mL ESCHERICHIA COLI  MRSA PCR Screening     Status: None   Collection Time: 10/04/17  6:30 PM  Result Value Ref Range Status   MRSA by PCR NEGATIVE NEGATIVE Final    Comment:        The GeneXpert MRSA Assay (FDA approved for NASAL specimens only), is one component of a comprehensive MRSA colonization surveillance program. It is not intended to diagnose MRSA infection nor to guide or monitor treatment for MRSA infections.       Radiology Studies: Mr Jeri Cos OE Contrast  Result Date: 10/05/2017 CLINICAL DATA:  Altered mental status. EXAM: MRI HEAD WITHOUT AND WITH CONTRAST TECHNIQUE: Multiplanar, multiecho pulse sequences of the brain and surrounding structures were obtained without and with intravenous contrast. CONTRAST:  <See Chart> MULTIHANCE GADOBENATE DIMEGLUMINE 529 MG/ML IV SOLN COMPARISON:  CT press.  Ray. FINDINGS: Brain: The images demonstrate acute or subacute infarct. Moderate and white matter changes are stable, for age. No hemorrhage or mass lesion present but to the degree of atrophy. The brainstem cerebellum normal. The auditory canals are bilaterally. Postcontrast images demonstrate no pathologic enhancement. Vascular: Flow is present in the major intracranial arteries. Skull and upper cervical spine: Skullbase is within limits. Craniocervical junction is normal. Grade 1 anterolisthesis at C2-3 is stable. Marrow signal is unremarkable. Sinuses/Orbits: Mild mucosal thickening is present within the anterior ethmoid air cells bilaterally. The remaining paranasal sinuses and the mastoid air cells are clear. The globes and orbits are within normal limits bilaterally. IMPRESSION: 1. Normal MRI the brain for age. 2. Minimal anterior ethmoid sinus disease. Electronically Signed   By: San Morelle M.D.   On: 10/05/2017 13:25      Scheduled Meds: . carbidopa-levodopa  1 tablet Oral TID  . cephALEXin  500 mg Oral Q8H  . divalproex  125 mg Oral Daily  . enoxaparin (LOVENOX) injection  40 mg Subcutaneous Daily  . finasteride  5 mg Oral Daily  . isosorbide mononitrate  15 mg Oral Daily  . losartan  50 mg Oral Daily  . mouth rinse  15 mL Mouth Rinse BID  . pravastatin  40 mg Oral Daily  . tamsulosin  0.4 mg Oral Daily   Continuous Infusions: . sodium chloride 50 mL/hr at 10/06/17 0420     Keshun Berrett  Cruzita Lederer, MD, PhD [facility] Pager [phone]  If 7PM-7AM, please contact night-coverage [email or web address] Password [email or web address] 10/06/2017, 12:20 PM

## 2017-10-06 NOTE — Evaluation (Signed)
Physical Therapy Evaluation Patient Details Name: Terry Taylor MRN: [id number] DOB: [birth year] Today's Date: 10/06/2017   History of Present Illness  83 year old male with prior history of CVA, paroxysmal A. fib, coronary artery disease, hypertension, COPD, who was brought in with acute encephalopathy and with concerns about patient being overmedicated by his wife  Clinical Impression   Pt admitted with above diagnosis. Pt currently with functional limitations due to the deficits listed below (see PT Problem List). Presents with decr cognition, generalized weakness, decr knee flexion bilaterally, and functional dependencies as outlined below; Prior to this functional decline (approx 3 weeks per daughter), he was ambulatory with RW; Daughter is requesting CIR for post-acute rehab, and this is not unreasonable; sticking point may be the need to have a solid discharge plan; Will place Rehab screen;  Pt will benefit from skilled PT to increase their independence and safety with mobility to allow discharge to the venue listed below.       Follow Up Recommendations Other (comment) (Post-acute Rehabilitation)    Equipment Recommendations  Rolling walker with 5" wheels;3in1 (PT)    Recommendations for Other Services OT consult     Precautions / Restrictions Precautions Precautions: Fall      Mobility  Bed Mobility Overal bed mobility: Needs Assistance Bed Mobility: Rolling;Sidelying to Sit Rolling: Mod assist Sidelying to sit: Max assist;+2 for physical assistance       General bed mobility comments: Cues for technqiue and max assist of 2 to elevate trunk to sit  Transfers Overall transfer level: Needs assistance Equipment used: 2 person hand held assist (supported hips on bed pad) Transfers: Squat Pivot Transfers     Squat pivot transfers: Max assist;+2 physical assistance     General transfer comment: Noted some bil LE muscle recruitment with a moderate amount of rise  against gravity during transfer; posterior bias, and required knee block for stability and 2 person assist for safety  Ambulation/Gait                Stairs            Wheelchair Mobility    Modified Rankin (Stroke Patients Only)       Balance Overall balance assessment: Needs assistance   Sitting balance-Leahy Scale: Poor (approaching Fair) Sitting balance - Comments: tends to drift into posterior lean     Standing balance-Leahy Scale: Zero                               Pertinent Vitals/Pain      Home Living Family/patient expects to be discharged to:: Private residence Living Arrangements: Spouse/significant other (Family is questioning the safety of pt staying with spouse) Available Help at Discharge: Family (to be determined) Type of Home: House Home Access: Level entry     Home Layout: Two level;Able to live on main level with bedroom/bathroom Home Equipment: Other (comment);Hospital bed;Grab bars - toilet;Bedside commode;Wheelchair - manual (3 wheeled RW) Additional Comments: has urinal, uses diapers, lift chair    Prior Function Level of Independence: Needs assistance   Gait / Transfers Assistance Needed: ambulates with 3WRW short distances with A, but daughter says it is not good quality  ADL's / Homemaking Assistance Needed: wife assists with all ADL        Hand Dominance   Dominant Hand: Right    Extremity/Trunk Assessment   Upper Extremity Assessment Upper Extremity Assessment: Generalized weakness    Lower Extremity Assessment  Lower Extremity Assessment: Generalized weakness;RLE deficits/detail;LLE deficits/detail RLE Deficits / Details: Noted decr knee flexion, to approx 75 deg LLE Deficits / Details: Noted decr flexion, to approx 65 deg       Communication   Communication: HOH  Cognition Arousal/Alertness: Awake/alert Behavior During Therapy: WFL for tasks assessed/performed Overall Cognitive Status:  Impaired/Different from baseline Area of Impairment: Orientation;Attention;Memory;Following commands;Safety/judgement;Problem solving                 Orientation Level: Disoriented to;Place;Time;Situation Current Attention Level: Sustained Memory: Decreased recall of precautions;Decreased short-term memory Following Commands: Follows one step commands with increased time Safety/Judgement: Decreased awareness of safety;Decreased awareness of deficits   Problem Solving: Slow processing;Decreased initiation;Difficulty sequencing;Requires verbal cues;Requires tactile cues        General Comments General comments (skin integrity, edema, etc.): VSS during session    Exercises     Assessment/Plan    PT Assessment Patient needs continued PT services  PT Problem List Decreased strength;Decreased range of motion;Decreased activity tolerance;Decreased balance;Decreased mobility;Decreased coordination;Decreased cognition;Decreased knowledge of use of DME;Decreased safety awareness;Decreased knowledge of precautions       PT Treatment Interventions DME instruction;Gait training;Functional mobility training;Therapeutic activities;Therapeutic exercise;Balance training;Neuromuscular re-education;Cognitive remediation;Patient/family education    PT Goals (Current goals can be found in the Care Plan section)  Acute Rehab PT Goals Patient Stated Goal: Unable to state PT Goal Formulation: With patient/family Time For Goal Achievement: 10/20/17 Potential to Achieve Goals: Good    Frequency Min 3X/week   Barriers to discharge Other (comment) it is apparent that he cannot go back to his home with wife per conversation with daughter    Co-evaluation               AM-PAC PT "6 Clicks" Daily Activity  Outcome Measure Difficulty turning over in bed (including adjusting bedclothes, sheets and blankets)?: Unable Difficulty moving from lying on back to sitting on the side of the bed? :  Unable Difficulty sitting down on and standing up from a chair with arms (e.g., wheelchair, bedside commode, etc,.)?: Unable Help needed moving to and from a bed to chair (including a wheelchair)?: A Lot Help needed walking in hospital room?: Total Help needed climbing 3-5 steps with a railing? : Total 6 Click Score: 7    End of Session Equipment Utilized During Treatment: Gait belt (bed pad) Activity Tolerance: Patient tolerated treatment well Patient left: in chair;with call bell/phone within reach;with chair alarm set;with nursing/sitter in room;with family/visitor present;Other (comment) (Dr. Cruzita Lederer in room) Nurse Communication: Mobility status PT Visit Diagnosis: Other abnormalities of gait and mobility (R26.89);Muscle weakness (generalized) (M62.81);Difficulty in walking, not elsewhere classified (R26.2)    Time: 1937-9024 PT Time Calculation (min) (ACUTE ONLY): 26 min   Charges:   PT Evaluation $PT Eval Moderate Complexity: 1 Mod PT Treatments $Therapeutic Activity: 8-22 mins   PT G Codes:        Roney Marion, PT  Acute Rehabilitation Services Pager [phone] Office [phone]   [place] 10/06/2017, 1:40 PM

## 2017-10-06 NOTE — Progress Notes (Signed)
Inpatient Rehabilitation  Per PT request, patient was screened by Gunnar Fusi for appropriateness for an Inpatient Acute Rehab consult.  At this time note that an IP Rehab consult order has also been placed.  Please plan for an Admission's Coordinator to follow up after the consult has been completed.    Carmelia Roller., CCC/SLP Admission Coordinator  [place]  Cell [phone]

## 2017-10-06 NOTE — Progress Notes (Signed)
Terry Taylor arrived to the unit via bed from 2C-05 at 1600, new low bed ordered, daughter in room on arrival with patient and belongings. Family made aware of surveillance video in room as well as teli-sitter presence. Patient has only upper dentures in mouth on arrival to unit.

## 2017-10-06 NOTE — Plan of Care (Signed)
Problem: Education: Goal: Knowledge of [facility] Education information/materials will improve Outcome: Not Progressing Remains confused,

## 2017-10-06 NOTE — Progress Notes (Signed)
PHARMACY NOTE:  ANTIMICROBIAL RENAL DOSAGE ADJUSTMENT  Current antimicrobial regimen includes a mismatch between antimicrobial dosage and estimated renal function.  As per policy approved by the Pharmacy & Therapeutics and Medical Executive Committees, the antimicrobial dosage will be adjusted accordingly.  Current antimicrobial dosage:  Keflex 500 mg q6hrs  Indication: UTI  Renal Function:  Estimated Creatinine Clearance: 40.2 mL/min (A) (by C-G formula based on SCr of 1.28 mg/dL (H)). []      On intermittent HD, scheduled: []      On CRRT    Antimicrobial dosage has been changed to:  Keflex 500 mg q8hrs  Additional comments: Changed at order verification   Thank you for allowing pharmacy to be a part of this patient's care.  Carlean Jews, [facility] 10/06/2017 11:19 AM

## 2017-10-07 ENCOUNTER — Encounter (HOSPITAL_COMMUNITY): Payer: Self-pay | Admitting: Physical Medicine and Rehabilitation

## 2017-10-07 DIAGNOSIS — R4182 Altered mental status, unspecified: Secondary | ICD-10-CM

## 2017-10-07 DIAGNOSIS — D649 Anemia, unspecified: Secondary | ICD-10-CM

## 2017-10-07 DIAGNOSIS — G20 Parkinson's disease: Secondary | ICD-10-CM

## 2017-10-07 DIAGNOSIS — J449 Chronic obstructive pulmonary disease, unspecified: Secondary | ICD-10-CM

## 2017-10-07 DIAGNOSIS — I10 Essential (primary) hypertension: Secondary | ICD-10-CM

## 2017-10-07 DIAGNOSIS — N289 Disorder of kidney and ureter, unspecified: Secondary | ICD-10-CM

## 2017-10-07 DIAGNOSIS — E8809 Other disorders of plasma-protein metabolism, not elsewhere classified: Secondary | ICD-10-CM

## 2017-10-07 DIAGNOSIS — E46 Unspecified protein-calorie malnutrition: Secondary | ICD-10-CM

## 2017-10-07 DIAGNOSIS — N3000 Acute cystitis without hematuria: Principal | ICD-10-CM

## 2017-10-07 DIAGNOSIS — I480 Paroxysmal atrial fibrillation: Secondary | ICD-10-CM

## 2017-10-07 DIAGNOSIS — Z79899 Other long term (current) drug therapy: Secondary | ICD-10-CM

## 2017-10-07 DIAGNOSIS — Z9981 Dependence on supplemental oxygen: Secondary | ICD-10-CM

## 2017-10-07 LAB — CBC
HCT: 40 % (ref 39.0–52.0)
Hemoglobin: 13.7 g/dL (ref 13.0–17.0)
MCH: 29 pg (ref 26.0–34.0)
MCHC: 34.3 g/dL (ref 30.0–36.0)
MCV: 84.6 fL (ref 78.0–100.0)
Platelets: 233 10*3/uL (ref 150–400)
RBC: 4.73 MIL/uL (ref 4.22–5.81)
RDW: 13.8 % (ref 11.5–15.5)
WBC: 8.6 10*3/uL (ref 4.0–10.5)

## 2017-10-07 LAB — COMPREHENSIVE METABOLIC PANEL
ALBUMIN: 3.1 g/dL — AB (ref 3.5–5.0)
ALK PHOS: 49 U/L (ref 38–126)
ALT: <5 U/L — ABNORMAL LOW (ref 17–63)
AST: 13 U/L — AB (ref 15–41)
Anion gap: 10 (ref 5–15)
BILIRUBIN TOTAL: 1 mg/dL (ref 0.3–1.2)
BUN: 16 mg/dL (ref 6–20)
CALCIUM: 8.4 mg/dL — AB (ref 8.9–10.3)
CO2: 23 mmol/L (ref 22–32)
Chloride: 103 mmol/L (ref 101–111)
Creatinine, Ser: 0.93 mg/dL (ref 0.61–1.24)
GFR calc Af Amer: >60 mL/min (ref >60)
GFR calc non Af Amer: >60 mL/min (ref >60)
GLUCOSE: 101 mg/dL — AB (ref 65–99)
Potassium: 3.7 mmol/L (ref 3.5–5.1)
Sodium: 136 mmol/L (ref 135–145)
TOTAL PROTEIN: 5.8 g/dL — AB (ref 6.5–8.1)

## 2017-10-07 NOTE — Progress Notes (Signed)
Physical Therapy Treatment Patient Details Name: Terry Taylor MRN: [id number] DOB: 18-Dec-1928 Today's Date: 10/07/2017    History of Present Illness 83 year old male with prior history of CVA, paroxysmal A. fib, coronary artery disease, hypertension, COPD, who was brought in with acute encephalopathy and with concerns about patient being overmedicated by his wife    PT Comments    Patient seen for activity progression. Patient requiring more assist for mobility to EOB and could not tolerate OOB today. Very rigid throughout session, question if timing of therapy and meds were impact. Will try to coordinate with parkinson's meds next session.  Continues to required +2 physical assist. Will follow.  Follow Up Recommendations  SNF     Equipment Recommendations  Rolling walker with 5" wheels;3in1 (PT)    Recommendations for Other Services OT consult     Precautions / Restrictions Precautions Precautions: Fall    Mobility  Bed Mobility Overal bed mobility: Needs Assistance Bed Mobility: Rolling;Sidelying to Sit;Sit to Supine Rolling: Mod assist Sidelying to sit: Max assist;+2 for physical assistance   Sit to supine: Max assist   General bed mobility comments: Cues for technqiue and max assist of 2 to elevate trunk to sit and bring LEs off EOB, Max assist to return to supine  Transfers Overall transfer level: Needs assistance               General transfer comment: could not attempt today +dizziness and extremely rigid possibly related to timing of sinement  Ambulation/Gait                 Stairs            Wheelchair Mobility    Modified Rankin (Stroke Patients Only)       Balance Overall balance assessment: Needs assistance Sitting-balance support: Feet supported Sitting balance-Leahy Scale: Poor (approaching Fair) Sitting balance - Comments: tends to drift into posterior lean     Standing balance-Leahy Scale: Zero                               Cognition Arousal/Alertness: Awake/alert Behavior During Therapy: WFL for tasks assessed/performed Overall Cognitive Status: Impaired/Different from baseline Area of Impairment: Orientation;Attention;Memory;Following commands;Safety/judgement;Problem solving                 Orientation Level: Disoriented to;Place;Time;Situation Current Attention Level: Sustained Memory: Decreased recall of precautions;Decreased short-term memory Following Commands: Follows one step commands with increased time Safety/Judgement: Decreased awareness of safety;Decreased awareness of deficits   Problem Solving: Slow processing;Decreased initiation;Difficulty sequencing;Requires verbal cues;Requires tactile cues        Exercises      General Comments        Pertinent Vitals/Pain      Home Living                      Prior Function            PT Goals (current goals can now be found in the care plan section) Acute Rehab PT Goals Patient Stated Goal: Unable to state PT Goal Formulation: With patient/family Time For Goal Achievement: 10/20/17 Potential to Achieve Goals: Good Progress towards PT goals: Not progressing toward goals - comment (very limited today, extremely rigid)    Frequency    Min 2X/week      PT Plan Current plan remains appropriate    Co-evaluation  AM-PAC PT "6 Clicks" Daily Activity  Outcome Measure  Difficulty turning over in bed (including adjusting bedclothes, sheets and blankets)?: Unable Difficulty moving from lying on back to sitting on the side of the bed? : Unable Difficulty sitting down on and standing up from a chair with arms (e.g., wheelchair, bedside commode, etc,.)?: Unable Help needed moving to and from a bed to chair (including a wheelchair)?: Total Help needed walking in hospital room?: Total Help needed climbing 3-5 steps with a railing? : Total 6 Click Score: 6    End of Session  Equipment Utilized During Treatment: Gait belt (bed pad) Activity Tolerance: Patient tolerated treatment well Patient left: in bed;with call bell/phone within reach;with bed alarm set;with nursing/sitter in room;with family/visitor present;Other (comment) Nurse Communication: Mobility status PT Visit Diagnosis: Other abnormalities of gait and mobility (R26.89);Muscle weakness (generalized) (M62.81);Difficulty in walking, not elsewhere classified (R26.2)     Time: [id number] PT Time Calculation (min) (ACUTE ONLY): 17 min  Charges:  $Therapeutic Activity: 8-22 mins                    G Codes:       Alben Deeds, PT DPT  Board Certified Neurologic Specialist [place] 10/07/2017, 4:53 PM
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
## 2017-10-07 NOTE — Progress Notes (Signed)
Pt not a candidate for an inpt rehab admit at this time. RN CM is aware. We will sign off. [phone]
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
## 2017-10-07 NOTE — Progress Notes (Addendum)
PROGRESS NOTE  Terry Taylor RJJ:[id number] DOB: [birth year] DOA: 10/03/2017 PCP: Remi Haggard, FNP   LOS: 3 days   Brief Narrative / Interim history:  83 year old male with prior history of CVA, paroxysmal A. fib, coronary artery disease, hypertension, COPD, who was brought in with acute encephalopathy and with concerns about patient being overmedicated by his wife  Assessment & Plan: Principal Problem:   Altered mental status Active Problems:   Overdose   UTI (urinary tract infection)   Anemia   Renal insufficiency   Acute encephalopathy -Likely due to combination of UTI as well as medications at home.  I am not sure about patient's baseline but probably based on prior notes during his hospitalizations in the past he has underlying dementia as well. -Met with the daughter at bedside and had an extensive conversation on 10/6 and 10/7.  She is quite concerned that her mother (patient's wife) has been overmedicating the patient.  She feels like his medication regimen has been changed significantly in the last several weeks, it appears that he has placed on Depakote, Klonopin, Risperdal, and she is not sure about the medical need for these medications.  She is also not sure whether the patient has Parkinson's either.  She tells me that there have been marital problems for some time between the patient and his wife, and feels like she is overmedicating the patient to keep him calm. Daughter is concerned that patient has become too lethargic, confused, and not recognizing other family members, more so in the last 3-4 weeks.  He has been having some concerning features for dementia per daughter for several months. -Continue antibiotics for UTI, stop Risperdal, decrease doses of Depakote and hold Klonopin as he is overly sedated this morning -TSH normal 1.3, vitamin B12 561, RPR negative  -MRI of the brain showed atrophy but no acute findings  -patient is improving  Urinary tract  infection -Initially placed on empiric antibiotics with ceftriaxone, urine cultures showed E. coli which is pansensitive.  Narrowed to Keflex.  Paroxysmal A. fib -Noted in January 2018, on aspirin due to not a good candidate for anticoagulation due to fragility and high fall risk -Appears to be in sinus now  Chronic kidney disease stage III -Baseline creatinine 1.1-1.4, currently at baseline, 1.18 this morning -Discontinue meloxicam  Anemia of chronic disease -Hemoglobin stable  Hypertension -Continue losartan  BPH -Continue Flomax/finasteride  Parkinson's -Continue Sinemet, daughter is questioning this diagnosis, probably once acute encephalopathy episode resolves this can be evaluated by neurology as an outpatient -Has some cogwheel rigidity and tremor at rest which goes away with intentional movement, suggesting true underlying Parkinson's disease.  This was discussed with daughter  Anxiety/mood disorder -Continue Depakote  Hyperlipidemia -Continue pravastatin  Dementia -Most likely, this has been undiagnosed but based on talking to the daughter as well as chart review -Once acute encephalopathy episode resolved, may benefit from outpatient neuropsychiatric testing  Disposition -CIR versus SNF, patient on restraints this morning as well as having a dialysis   DVT prophylaxis: Lovenox Code Status: Full code (confirmed with daughter on 10/6) Family Communication: Discussed with daughter and son at bedside Disposition Plan: Transfer to [place]  Consultants:   None  Procedures:   None   Antimicrobials:  Ceftriaxone 10/5 >> 10/7  Keflex 10/7 >>  Subjective: -Sleepy this morning, but wakes up easily and denies any complaints  Objective: Vitals:   10/06/17 2100 10/07/17 0100 10/07/17 0500 10/07/17 0939  BP: (!) 147/82 138/84 123/84 136/70  Pulse: 66 62 68 67  Resp: '15 14 15 20  '$ Temp: 98.5 F (36.9 C) 98.4 F (36.9 C) 98.5 F (36.9 C) 98.8 F  (37.1 C)  TempSrc: Axillary Axillary Axillary Oral  SpO2: 97% 96% 97% 97%  Weight:   71.2 kg (157 lb)   Height:   '5\' 10"'$  (1.778 m)     Intake/Output Summary (Last 24 hours) at 10/07/17 1127 Last data filed at 10/07/17 0500  Gross per 24 hour  Intake          1386.66 ml  Output              950 ml  Net           436.66 ml   Filed Weights   10/05/17 1700 10/06/17 1540 10/07/17 0500  Weight: 69.9 kg (154 lb 1.6 oz) 71.5 kg (157 lb 9.6 oz) 71.2 kg (157 lb)    Examination:  Constitutional: NAD Eyes: left eye ectropion Respiratory: CTA biL Cardiovascular: RRR  Data Reviewed: I have independently reviewed following labs and imaging studies   CBC:  Recent Labs Lab 10/03/17 2220 10/04/17 0438 10/05/17 0854 10/06/17 0231 10/07/17 0518  WBC 7.9 8.1 7.4 7.9 8.6  NEUTROABS 5.8  --   --   --   --   HGB 12.5* 12.8* 13.5 12.9* 13.7  HCT 37.3* 38.5* 39.8 39.2 40.0  MCV 85.4 86.1 86.1 86.0 84.6  PLT 230 216 237 230 863   Basic Metabolic Panel:  Recent Labs Lab 10/03/17 2220 10/04/17 0438 10/05/17 0854 10/06/17 0231 10/07/17 0518  NA 135 137 138 139 136  K 4.2 4.2 4.2 3.9 3.7  CL 104 101 104 107 103  CO2 25 23 21* 22 23  GLUCOSE 110* 95 75 108* 101*  BUN 35* 32* 23* 27* 16  CREATININE 1.36* 1.38* 1.18 1.28* 0.93  CALCIUM 8.6* 8.7* 8.5* 8.3* 8.4*   GFR: Estimated Creatinine Clearance: 56.4 mL/min (by C-G formula based on SCr of 0.93 mg/dL). Liver Function Tests:  Recent Labs Lab 10/03/17 2220 10/04/17 0438 10/06/17 0231 10/07/17 0518  AST 11* 12* 12* 13*  ALT <5* <5* 11* <5*  ALKPHOS 49 48 46 49  BILITOT 1.0 1.2 0.7 1.0  PROT 5.7* 6.2* 5.4* 5.8*  ALBUMIN 3.2* 3.3* 2.9* 3.1*   No results for input(s): LIPASE, AMYLASE in the last 168 hours. No results for input(s): AMMONIA in the last 168 hours. Coagulation Profile: No results for input(s): INR, PROTIME in the last 168 hours. Cardiac Enzymes: No results for input(s): CKTOTAL, CKMB, CKMBINDEX, TROPONINI  in the last 168 hours. BNP (last 3 results) No results for input(s): PROBNP in the last 8760 hours. HbA1C: No results for input(s): HGBA1C in the last 72 hours. CBG: No results for input(s): GLUCAP in the last 168 hours. Lipid Profile: No results for input(s): CHOL, HDL, LDLCALC, TRIG, CHOLHDL, LDLDIRECT in the last 72 hours. Thyroid Function Tests: No results for input(s): TSH, T4TOTAL, FREET4, T3FREE, THYROIDAB in the last 72 hours. Anemia Panel: No results for input(s): VITAMINB12, FOLATE, FERRITIN, TIBC, IRON, RETICCTPCT in the last 72 hours. Urine analysis:    Component Value Date/Time   COLORURINE YELLOW 10/04/2017 0055   APPEARANCEUR CLOUDY (A) 10/04/2017 0055   APPEARANCEUR Clear 10/08/2014 0848   LABSPEC 1.017 10/04/2017 0055   LABSPEC 1.009 10/08/2014 0848   PHURINE 5.0 10/04/2017 0055   GLUCOSEU NEGATIVE 10/04/2017 0055   GLUCOSEU Negative 10/08/2014 0848   HGBUR LARGE (A) 10/04/2017 0055   BILIRUBINUR  NEGATIVE 10/04/2017 0055   BILIRUBINUR Negative 10/08/2014 0848   KETONESUR 5 (A) 10/04/2017 0055   PROTEINUR 100 (A) 10/04/2017 0055   UROBILINOGEN 1.0 03/06/2013 1924   NITRITE POSITIVE (A) 10/04/2017 0055   LEUKOCYTESUR LARGE (A) 10/04/2017 0055   LEUKOCYTESUR Negative 10/08/2014 0848   Sepsis Labs: Invalid input(s): PROCALCITONIN, LACTICIDVEN  Recent Results (from the past 240 hour(s))  Urine culture     Status: Abnormal   Collection Time: 10/04/17 12:55 AM  Result Value Ref Range Status   Specimen Description URINE, RANDOM  Final   Special Requests NONE  Final   Culture >=100,000 COLONIES/mL ESCHERICHIA COLI (A)  Final   Report Status 10/06/2017 FINAL  Final   Organism ID, Bacteria ESCHERICHIA COLI (A)  Final      Susceptibility   Escherichia coli - MIC*    AMPICILLIN <=2 SENSITIVE Sensitive     CEFAZOLIN <=4 SENSITIVE Sensitive     CEFTRIAXONE <=1 SENSITIVE Sensitive     CIPROFLOXACIN <=0.25 SENSITIVE Sensitive     GENTAMICIN <=1 SENSITIVE Sensitive      IMIPENEM <=0.25 SENSITIVE Sensitive     NITROFURANTOIN <=16 SENSITIVE Sensitive     TRIMETH/SULFA <=20 SENSITIVE Sensitive     AMPICILLIN/SULBACTAM <=2 SENSITIVE Sensitive     PIP/TAZO <=4 SENSITIVE Sensitive     Extended ESBL NEGATIVE Sensitive     * >=100,000 COLONIES/mL ESCHERICHIA COLI  MRSA PCR Screening     Status: None   Collection Time: 10/04/17  6:30 PM  Result Value Ref Range Status   MRSA by PCR NEGATIVE NEGATIVE Final    Comment:        The GeneXpert MRSA Assay (FDA approved for NASAL specimens only), is one component of a comprehensive MRSA colonization surveillance program. It is not intended to diagnose MRSA infection nor to guide or monitor treatment for MRSA infections.       Radiology Studies: Mr Jeri Cos ZS Contrast  Result Date: 10/05/2017 CLINICAL DATA:  Altered mental status. EXAM: MRI HEAD WITHOUT AND WITH CONTRAST TECHNIQUE: Multiplanar, multiecho pulse sequences of the brain and surrounding structures were obtained without and with intravenous contrast. CONTRAST:  <See Chart> MULTIHANCE GADOBENATE DIMEGLUMINE 529 MG/ML IV SOLN COMPARISON:  CT press.  Ray. FINDINGS: Brain: The images demonstrate acute or subacute infarct. Moderate and white matter changes are stable, for age. No hemorrhage or mass lesion present but to the degree of atrophy. The brainstem cerebellum normal. The auditory canals are bilaterally. Postcontrast images demonstrate no pathologic enhancement. Vascular: Flow is present in the major intracranial arteries. Skull and upper cervical spine: Skullbase is within limits. Craniocervical junction is normal. Grade 1 anterolisthesis at C2-3 is stable. Marrow signal is unremarkable. Sinuses/Orbits: Mild mucosal thickening is present within the anterior ethmoid air cells bilaterally. The remaining paranasal sinuses and the mastoid air cells are clear. The globes and orbits are within normal limits bilaterally. IMPRESSION: 1. Normal MRI the brain for  age. 2. Minimal anterior ethmoid sinus disease. Electronically Signed   By: San Morelle M.D.   On: 10/05/2017 13:25     Scheduled Meds: . carbidopa-levodopa  1 tablet Oral TID  . cephALEXin  500 mg Oral Q8H  . divalproex  125 mg Oral Daily  . enoxaparin (LOVENOX) injection  40 mg Subcutaneous Daily  . finasteride  5 mg Oral Daily  . isosorbide mononitrate  15 mg Oral Daily  . losartan  50 mg Oral Daily  . mouth rinse  15 mL Mouth Rinse BID  .  pravastatin  40 mg Oral Daily  . tamsulosin  0.4 mg Oral Daily   Continuous Infusions: . sodium chloride 50 mL/hr at 10/06/17 1856     Marzetta Board, MD, PhD [facility] Pager [phone]  If 7PM-7AM, please contact night-coverage [email or web address] Password [email or web address] 10/07/2017, 11:27 AM

## 2017-10-07 NOTE — Progress Notes (Signed)
Terry Taylor transferred to room [phone] related to privacy block with personal belongings and son Stanislaus II notified of new room.

## 2017-10-07 NOTE — Progress Notes (Signed)
Plan is CIR vs SNF. CM following for d/c disposition.

## 2017-10-08 LAB — ANTINUCLEAR ANTIBODIES, IFA: ANTINUCLEAR ANTIBODIES, IFA: NEGATIVE

## 2017-10-08 NOTE — Care Management Important Message (Signed)
Important Message  Patient Details  Name: Terry Taylor MRN: [id number] Date of Birth: [birth year]   Medicare Important Message Given:  Yes    Branch Pacitti Abena 10/08/2017, 9:32 AM

## 2017-10-08 NOTE — NC FL2 (Signed)
[place] LEVEL OF CARE SCREENING TOOL     IDENTIFICATION  Patient Name: Terry Taylor Birthdate: 1928-05-05 Sex: male Admission Date (Current Location): 10/03/2017  [facility] and [place] Number:  Herbalist and Address:  [facility] [place], [place] [address], [place], [place] [address]      Provider Number: [id number]  Attending Physician Name and Address:  Caren Griffins, MD  Relative Name and Phone Number:       Current Level of Care: Hospital Recommended Level of Care: [place] Prior Approval Number:    Date Approved/Denied:   PASRR Number: Pending; manual review  Discharge Plan: SNF    Current Diagnoses: Patient Active Problem List   Diagnosis Date Noted  . Polypharmacy   . Supplemental oxygen dependent   . PAF (paroxysmal atrial fibrillation) ([place])   . Benign essential HTN   . Parkinson disease ([place])   . Hypoalbuminemia due to protein-calorie malnutrition ([place])   . Overdose 10/04/2017  . Altered mental status 10/04/2017  . UTI (urinary tract infection) 10/04/2017  . Anemia 10/04/2017  . Renal insufficiency 10/04/2017  . Paroxysmal A-fib ([place]) 01/11/2017  . Influenza A 01/09/2017  . COPD (chronic obstructive pulmonary disease) ([place]) 01/08/2017  . Dementia 09/13/2016  . Chest pain, rule out acute myocardial infarction 09/12/2016  . Fever 03/07/2013  . Acute encephalopathy 03/07/2013  . Weakness generalized 03/07/2013  . HYPERCHOLESTEROLEMIA  IIA 04/09/2009  . Essential hypertension 04/09/2009  . Coronary artery disease due to lipid rich plaque 04/09/2009  . COPD exacerbation ([place]) 04/09/2009  . OSTEOARTHRITIS 04/09/2009  . BENIGN PROSTATIC HYPERTROPHY, HX OF 04/09/2009    Orientation RESPIRATION BLADDER Height & Weight     Self  Normal Incontinent, External catheter (placed 10/04/17) Weight: 150 lb (68 kg) Height:  5\' 10"  (177.8 cm)  BEHAVIORAL SYMPTOMS/MOOD NEUROLOGICAL BOWEL NUTRITION  STATUS      Incontinent Diet (dysphagia 1)  AMBULATORY STATUS COMMUNICATION OF NEEDS Skin   Extensive Assist Verbally (dysarthria, hard to understand) PU Stage and Appropriate Care   PU Stage 2 Dressing:  (on buttocks; foam dressing, change every 3 days)                   Personal Care Assistance Level of Assistance  Bathing, Feeding, Dressing Bathing Assistance: Maximum assistance Feeding assistance: Maximum assistance Dressing Assistance: Maximum assistance     Functional Limitations Info  Speech     Speech Info: Impaired    SPECIAL CARE FACTORS FREQUENCY  PT (By licensed PT), OT (By licensed OT), Speech therapy     PT Frequency: 5x/wk OT Frequency: 5x/wk     Speech Therapy Frequency: 5x/wk      Contractures      Additional Factors Info  Code Status, Allergies, Psychotropic Code Status Info: full Allergies Info: Bee Venom Psychotropic Info: Depakote 125 mg         Current Medications (10/08/2017):  This is the current hospital active medication list Current Facility-Administered Medications  Medication Dose Route Frequency Provider Last Rate Last Dose  . 0.9 %  sodium chloride infusion   Intravenous Continuous Blount, Xenia T, NP 50 mL/hr at 10/08/17 1517    . acetaminophen (TYLENOL) tablet 650 mg  650 mg Oral Q6H PRN Jani Gravel, MD   650 mg at 10/04/17 0240   Or  . acetaminophen (TYLENOL) suppository 650 mg  650 mg Rectal Q6H PRN Jani Gravel, MD      . carbidopa-levodopa (SINEMET IR)  25-100 MG per tablet immediate release 1 tablet  1 tablet Oral TID Jani Gravel, MD   1 tablet at 10/08/17 1510  . cephALEXin (KEFLEX) capsule 500 mg  500 mg Oral Q8H Gherghe, Costin M, MD   500 mg at 10/08/17 1400  . divalproex (DEPAKOTE) DR tablet 125 mg  125 mg Oral Daily Caren Griffins, MD   125 mg at 10/08/17 0847  . enoxaparin (LOVENOX) injection 40 mg  40 mg Subcutaneous Daily Jani Gravel, MD   40 mg at 10/08/17 0847  . finasteride (PROSCAR) tablet 5 mg  5 mg Oral Daily  Jani Gravel, MD   5 mg at 10/08/17 0848  . isosorbide mononitrate (IMDUR) 24 hr tablet 15 mg  15 mg Oral Daily Jani Gravel, MD   15 mg at 10/08/17 0848  . losartan (COZAAR) tablet 50 mg  50 mg Oral Daily Jani Gravel, MD   50 mg at 10/08/17 0848  . MEDLINE mouth rinse  15 mL Mouth Rinse BID Caren Griffins, MD   15 mL at 10/08/17 0849  . naphazoline-glycerin (CLEAR EYES) ophth solution 1-2 drop  1-2 drop Both Eyes QID PRN Caren Griffins, MD      . pravastatin (PRAVACHOL) tablet 40 mg  40 mg Oral Daily Jani Gravel, MD   40 mg at 10/08/17 0848  . tamsulosin (FLOMAX) capsule 0.4 mg  0.4 mg Oral Daily Jani Gravel, MD   0.4 mg at 10/08/17 0848     Discharge Medications: Please see discharge summary for a list of discharge medications.  Relevant Imaging Results:  Relevant Lab Results:   Additional Information SS#: [id number]  Geralynn Ochs, LCSW

## 2017-10-08 NOTE — Progress Notes (Signed)
PROGRESS NOTE  Terry Taylor XBJ:[id number] DOB: [birth year] DOA: 10/03/2017 PCP: Remi Haggard, FNP   LOS: 4 days   Brief Narrative / Interim history:  83 year old male with prior history of CVA, paroxysmal A. fib, coronary artery disease, hypertension, COPD, who was brought in with acute encephalopathy and with concerns about patient being overmedicated by his wife.  His home medications were held, he was also found to have a UTI and he was started on treatment.  He is improving, SNF placement is pending at this time, patient had a tele-sitter 10/8-10/9 overnight and cannot be discharged unless he goes 24 hours without sitter or restraints.  Assessment & Plan: Principal Problem:   Altered mental status Active Problems:   Overdose   UTI (urinary tract infection)   Anemia   Renal insufficiency   Polypharmacy   Supplemental oxygen dependent   PAF (paroxysmal atrial fibrillation) (HCC)   Benign essential HTN   Parkinson disease (HCC)   Hypoalbuminemia due to protein-calorie malnutrition (HCC)   Acute encephalopathy -Likely due to combination of UTI as well as medications at home.  I am not sure about patient's baseline but probably based on prior notes during his hospitalizations in the past he has underlying dementia as well. -Met with the daughter at bedside and had an extensive conversations daily.  She is quite concerned that her mother (patient's wife) has been overmedicating the patient.  She feels like his medication regimen has been changed significantly in the last several weeks, it appears that he has placed on Depakote, Klonopin, Risperdal, and she is not sure about the medical need for these medications.  She is also not sure whether the patient has Parkinson's either.  She tells me that there have been marital problems for some time between the patient and his wife, and feels like she is overmedicating the patient to keep him calm. Daughter is concerned that patient  has become too lethargic, confused, and not recognizing other family members, more so in the last 3-4 weeks.  He has been having some concerning features for dementia per daughter for several months. -Continue antibiotics for UTI, stop Risperdal, decrease doses of Depakote and hold Klonopin as he is overly sedated this morning -TSH normal 1.3, vitamin B12 561, RPR negative  -MRI of the brain showed atrophy but no acute findings  -patient is improving  Urinary tract infection -Initially placed on empiric antibiotics with ceftriaxone, urine cultures showed E. coli which is pansensitive.  Narrowed to Keflex.  Paroxysmal A. fib -Noted in January 2018, on aspirin due to not a good candidate for anticoagulation due to fragility and high fall risk -Appears to be in sinus now  Chronic kidney disease stage III -Baseline creatinine 1.1-1.4, currently at baseline, 0.93 today -Discontinue meloxicam  Anemia of chronic disease -Hemoglobin stable  Hypertension -Continue losartan  BPH -Continue Flomax/finasteride  Parkinson's -Continue Sinemet, daughter is questioning this diagnosis, probably once acute encephalopathy episode resolves this can be evaluated by neurology as an outpatient -Has cogwheel rigidity exam and tremor at rest which goes away with intentional movement, suggesting true underlying Parkinson's disease.  This was discussed with daughter  Anxiety/mood disorder -Continue Depakote but dose decreased   Hyperlipidemia -Continue pravastatin  Dementia -Most likely, this has been undiagnosed but based on talking to the daughter as well as chart review -Once acute encephalopathy episode resolved, may benefit from outpatient neuropsychiatric testing  Disposition -SNF, CIR declined patient   DVT prophylaxis: Lovenox Code Status: Full code (confirmed with  daughter on 10/6) Family Communication: Discussed with daughter at bedside Disposition Plan: SNF once off  restraints  Consultants:   None  Procedures:   None   Antimicrobials:  Ceftriaxone 10/5 >> 10/7  Keflex 10/7 >>  Subjective: -No complaints, confused, somewhat sleepy  Objective: Vitals:   10/07/17 2133 10/08/17 0100 10/08/17 0411 10/08/17 0928  BP: 134/89 124/78 (!) 151/72 (!) 139/94  Pulse: 74 76 74 79  Resp: '18 18 18 18  '$ Temp: 99.5 F (37.5 C) 99.8 F (37.7 C) 99.3 F (37.4 C) 98.6 F (37 C)  TempSrc: Axillary Axillary Axillary Oral  SpO2: 97% 94% 99% 97%  Weight:  68 kg (150 lb)    Height:        Intake/Output Summary (Last 24 hours) at 10/08/17 1147 Last data filed at 10/08/17 0100  Gross per 24 hour  Intake                0 ml  Output              800 ml  Net             -800 ml   Filed Weights   10/06/17 1540 10/07/17 0500 10/08/17 0100  Weight: 71.5 kg (157 lb 9.6 oz) 71.2 kg (157 lb) 68 kg (150 lb)    Examination:  Constitutional: NAD Eyes: left eye ectropion Respiratory: CTA Cardiovascular: RRR  Data Reviewed: I have independently reviewed following labs and imaging studies   CBC:  Recent Labs Lab 10/03/17 2220 10/04/17 0438 10/05/17 0854 10/06/17 0231 10/07/17 0518  WBC 7.9 8.1 7.4 7.9 8.6  NEUTROABS 5.8  --   --   --   --   HGB 12.5* 12.8* 13.5 12.9* 13.7  HCT 37.3* 38.5* 39.8 39.2 40.0  MCV 85.4 86.1 86.1 86.0 84.6  PLT 230 216 237 230 854   Basic Metabolic Panel:  Recent Labs Lab 10/03/17 2220 10/04/17 0438 10/05/17 0854 10/06/17 0231 10/07/17 0518  NA 135 137 138 139 136  K 4.2 4.2 4.2 3.9 3.7  CL 104 101 104 107 103  CO2 25 23 21* 22 23  GLUCOSE 110* 95 75 108* 101*  BUN 35* 32* 23* 27* 16  CREATININE 1.36* 1.38* 1.18 1.28* 0.93  CALCIUM 8.6* 8.7* 8.5* 8.3* 8.4*   GFR: Estimated Creatinine Clearance: 53.8 mL/min (by C-G formula based on SCr of 0.93 mg/dL). Liver Function Tests:  Recent Labs Lab 10/03/17 2220 10/04/17 0438 10/06/17 0231 10/07/17 0518  AST 11* 12* 12* 13*  ALT <5* <5* 11* <5*  ALKPHOS  49 48 46 49  BILITOT 1.0 1.2 0.7 1.0  PROT 5.7* 6.2* 5.4* 5.8*  ALBUMIN 3.2* 3.3* 2.9* 3.1*   No results for input(s): LIPASE, AMYLASE in the last 168 hours. No results for input(s): AMMONIA in the last 168 hours. Coagulation Profile: No results for input(s): INR, PROTIME in the last 168 hours. Cardiac Enzymes: No results for input(s): CKTOTAL, CKMB, CKMBINDEX, TROPONINI in the last 168 hours. BNP (last 3 results) No results for input(s): PROBNP in the last 8760 hours. HbA1C: No results for input(s): HGBA1C in the last 72 hours. CBG: No results for input(s): GLUCAP in the last 168 hours. Lipid Profile: No results for input(s): CHOL, HDL, LDLCALC, TRIG, CHOLHDL, LDLDIRECT in the last 72 hours. Thyroid Function Tests: No results for input(s): TSH, T4TOTAL, FREET4, T3FREE, THYROIDAB in the last 72 hours. Anemia Panel: No results for input(s): VITAMINB12, FOLATE, FERRITIN, TIBC, IRON, RETICCTPCT in the last  72 hours. Urine analysis:    Component Value Date/Time   COLORURINE YELLOW 10/04/2017 0055   APPEARANCEUR CLOUDY (A) 10/04/2017 0055   APPEARANCEUR Clear 10/08/2014 0848   LABSPEC 1.017 10/04/2017 0055   LABSPEC 1.009 10/08/2014 0848   PHURINE 5.0 10/04/2017 0055   GLUCOSEU NEGATIVE 10/04/2017 0055   GLUCOSEU Negative 10/08/2014 0848   HGBUR LARGE (A) 10/04/2017 0055   BILIRUBINUR NEGATIVE 10/04/2017 0055   BILIRUBINUR Negative 10/08/2014 0848   KETONESUR 5 (A) 10/04/2017 0055   PROTEINUR 100 (A) 10/04/2017 0055   UROBILINOGEN 1.0 03/06/2013 1924   NITRITE POSITIVE (A) 10/04/2017 0055   LEUKOCYTESUR LARGE (A) 10/04/2017 0055   LEUKOCYTESUR Negative 10/08/2014 0848   Sepsis Labs: Invalid input(s): PROCALCITONIN, LACTICIDVEN  Recent Results (from the past 240 hour(s))  Urine culture     Status: Abnormal   Collection Time: 10/04/17 12:55 AM  Result Value Ref Range Status   Specimen Description URINE, RANDOM  Final   Special Requests NONE  Final   Culture >=100,000  COLONIES/mL ESCHERICHIA COLI (A)  Final   Report Status 10/06/2017 FINAL  Final   Organism ID, Bacteria ESCHERICHIA COLI (A)  Final      Susceptibility   Escherichia coli - MIC*    AMPICILLIN <=2 SENSITIVE Sensitive     CEFAZOLIN <=4 SENSITIVE Sensitive     CEFTRIAXONE <=1 SENSITIVE Sensitive     CIPROFLOXACIN <=0.25 SENSITIVE Sensitive     GENTAMICIN <=1 SENSITIVE Sensitive     IMIPENEM <=0.25 SENSITIVE Sensitive     NITROFURANTOIN <=16 SENSITIVE Sensitive     TRIMETH/SULFA <=20 SENSITIVE Sensitive     AMPICILLIN/SULBACTAM <=2 SENSITIVE Sensitive     PIP/TAZO <=4 SENSITIVE Sensitive     Extended ESBL NEGATIVE Sensitive     * >=100,000 COLONIES/mL ESCHERICHIA COLI  MRSA PCR Screening     Status: None   Collection Time: 10/04/17  6:30 PM  Result Value Ref Range Status   MRSA by PCR NEGATIVE NEGATIVE Final    Comment:        The GeneXpert MRSA Assay (FDA approved for NASAL specimens only), is one component of a comprehensive MRSA colonization surveillance program. It is not intended to diagnose MRSA infection nor to guide or monitor treatment for MRSA infections.       Radiology Studies: No results found.   Scheduled Meds: . carbidopa-levodopa  1 tablet Oral TID  . cephALEXin  500 mg Oral Q8H  . divalproex  125 mg Oral Daily  . enoxaparin (LOVENOX) injection  40 mg Subcutaneous Daily  . finasteride  5 mg Oral Daily  . isosorbide mononitrate  15 mg Oral Daily  . losartan  50 mg Oral Daily  . mouth rinse  15 mL Mouth Rinse BID  . pravastatin  40 mg Oral Daily  . tamsulosin  0.4 mg Oral Daily   Continuous Infusions: . sodium chloride 50 mL/hr at 10/07/17 1551     Marzetta Board, MD, PhD [facility] Pager [phone]  If 7PM-7AM, please contact night-coverage [email or web address] Password [email or web address] 10/08/2017, 11:47 AM

## 2017-10-08 NOTE — Progress Notes (Signed)
Telemonitor discontinued 0900.  Patient resting quietly in bed, call light within reach.  No noted discomfort.
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
## 2017-10-09 DIAGNOSIS — L8990 Pressure ulcer of unspecified site, unspecified stage: Secondary | ICD-10-CM | POA: Insufficient documentation

## 2017-10-09 LAB — BASIC METABOLIC PANEL
ANION GAP: 12 (ref 5–15)
BUN: 13 mg/dL (ref 6–20)
CO2: 21 mmol/L — ABNORMAL LOW (ref 22–32)
Calcium: 8.5 mg/dL — ABNORMAL LOW (ref 8.9–10.3)
Chloride: 103 mmol/L (ref 101–111)
Creatinine, Ser: 1.01 mg/dL (ref 0.61–1.24)
GFR calc Af Amer: >60 mL/min (ref >60)
Glucose, Bld: 88 mg/dL (ref 65–99)
POTASSIUM: 3.9 mmol/L (ref 3.5–5.1)
SODIUM: 136 mmol/L (ref 135–145)

## 2017-10-09 MED ORDER — NAPHAZOLINE-GLYCERIN 0.012-0.2 % OP SOLN: 1.0000 [drp] | Freq: Four times a day (QID) | OPHTHALMIC | 0 refills | Status: AC | PRN

## 2017-10-09 MED ORDER — CEPHALEXIN 500 MG PO CAPS: 500.0000 mg | ORAL_CAPSULE | Freq: Three times a day (TID) | ORAL | Status: DC

## 2017-10-09 MED ORDER — DIVALPROEX SODIUM 125 MG PO DR TAB: 125.0000 mg | DELAYED_RELEASE_TABLET | Freq: Every day | ORAL | Status: DC
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
## 2017-10-09 NOTE — Care Management Note (Signed)
Case Management Note  Patient Details  Name: Terry Taylor MRN: [id number] Date of Birth: [birth year]  Subjective/Objective:                    Action/Plan: Pt is discharging to [facility] No further needs per CM.  Expected Discharge Date:  10/09/17               Expected Discharge Plan:  [place]  In-House Referral:  Clinical Social Work, [facility]  Discharge planning Services  CM Consult  Post Acute Care Choice:  Resumption of Svcs/PTA Provider Choice offered to:  Adult Children Breyon Sigg [phone] and Jules Baty [place] [phone] POA)  DME Arranged:    DME Agency:     HH Arranged:    HH Agency:   (Patient is active with [facility])  Status of Service:  Completed, signed off  If discussed at [place] of Stay Meetings, dates discussed:    Additional Comments:  Pollie Friar, RN 10/09/2017, 2:42 PM

## 2017-10-10 ENCOUNTER — Inpatient Hospital Stay (HOSPITAL_COMMUNITY): Payer: Medicare HMO

## 2017-10-10 DIAGNOSIS — Z87891 Personal history of nicotine dependence: Secondary | ICD-10-CM

## 2017-10-10 DIAGNOSIS — Z515 Encounter for palliative care: Secondary | ICD-10-CM

## 2017-10-10 DIAGNOSIS — R441 Visual hallucinations: Secondary | ICD-10-CM

## 2017-10-10 DIAGNOSIS — F29 Unspecified psychosis not due to a substance or known physiological condition: Secondary | ICD-10-CM

## 2017-10-10 DIAGNOSIS — N390 Urinary tract infection, site not specified: Secondary | ICD-10-CM

## 2017-10-10 DIAGNOSIS — L8990 Pressure ulcer of unspecified site, unspecified stage: Secondary | ICD-10-CM

## 2017-10-10 DIAGNOSIS — Z7189 Other specified counseling: Secondary | ICD-10-CM

## 2017-10-10 LAB — BASIC METABOLIC PANEL
ANION GAP: 8 (ref 5–15)
BUN: 16 mg/dL (ref 6–20)
CHLORIDE: 102 mmol/L (ref 101–111)
CO2: 24 mmol/L (ref 22–32)
Calcium: 8.1 mg/dL — ABNORMAL LOW (ref 8.9–10.3)
Creatinine, Ser: 1.01 mg/dL (ref 0.61–1.24)
GFR calc Af Amer: >60 mL/min (ref >60)
GFR calc non Af Amer: >60 mL/min (ref >60)
Glucose, Bld: 97 mg/dL (ref 65–99)
POTASSIUM: 3.8 mmol/L (ref 3.5–5.1)
Sodium: 134 mmol/L — ABNORMAL LOW (ref 135–145)

## 2017-10-10 LAB — CBC
HEMATOCRIT: 33.9 % — AB (ref 39.0–52.0)
Hemoglobin: 11.8 g/dL — ABNORMAL LOW (ref 13.0–17.0)
MCH: 29 pg (ref 26.0–34.0)
MCHC: 34.8 g/dL (ref 30.0–36.0)
MCV: 83.3 fL (ref 78.0–100.0)
Platelets: 242 10*3/uL (ref 150–400)
RBC: 4.07 MIL/uL — AB (ref 4.22–5.81)
RDW: 13.9 % (ref 11.5–15.5)
WBC: 10.6 10*3/uL — AB (ref 4.0–10.5)

## 2017-10-10 MED ORDER — CIPROFLOXACIN HCL 500 MG PO TABS: 500.0000 mg | ORAL_TABLET | Freq: Two times a day (BID) | ORAL | Status: DC

## 2017-10-10 MED ORDER — AMOXICILLIN 500 MG PO CAPS: 500.0000 mg | ORAL_CAPSULE | Freq: Three times a day (TID) | ORAL | Status: DC

## 2017-10-10 MED ORDER — CLONAZEPAM 0.5 MG PO TABS
0.5000 mg | ORAL_TABLET | Freq: Every day | ORAL | Status: DC
  Administered 2017-10-10 – 2017-10-11 (×2): 0.5 mg via ORAL
  Filled 2017-10-10 (×2): qty 1

## 2017-10-10 MED ORDER — RISPERIDONE 0.5 MG PO TABS
0.2500 mg | ORAL_TABLET | Freq: Two times a day (BID) | ORAL | Status: DC
  Administered 2017-10-10: 0.25 mg via ORAL
  Filled 2017-10-10: qty 1
  Filled 2017-10-10: qty 0.5

## 2017-10-10 MED ORDER — LEVOFLOXACIN 500 MG PO TABS
500.0000 mg | ORAL_TABLET | Freq: Every day | ORAL | Status: DC
  Administered 2017-10-10: 500 mg via ORAL
  Filled 2017-10-10: qty 1

## 2017-10-10 MED ORDER — AMOXICILLIN 500 MG PO CAPS
500.0000 mg | ORAL_CAPSULE | Freq: Three times a day (TID) | ORAL | Status: DC
  Administered 2017-10-10 – 2017-10-12 (×6): 500 mg via ORAL
  Filled 2017-10-10 (×7): qty 1

## 2017-10-10 MED ORDER — LEVOFLOXACIN 500 MG PO TABS: 500.0000 mg | ORAL_TABLET | Freq: Every day | ORAL | Status: DC

## 2017-10-10 NOTE — Consult Note (Signed)
[place] [facility] Consult   Reason for Consult:  Dementia, parkinson and AMS Referring Physician:  Dr. Tana Coast Patient Identification: AMYR SLUDER MRN:  [id number] Principal Diagnosis: Altered mental status Diagnosis:   Patient Active Problem List   Diagnosis Date Noted  . Pressure injury of skin [L89.90] 10/09/2017  . Polypharmacy [Z79.899]   . Supplemental oxygen dependent [Z99.81]   . PAF (paroxysmal atrial fibrillation) ([place]) [I48.0]   . Benign essential HTN [I10]   . Parkinson disease ([place]) [G20]   . Hypoalbuminemia due to protein-calorie malnutrition ([place]) [E46]   . Overdose [T50.901A] 10/04/2017  . Altered mental status [R41.82] 10/04/2017  . UTI (urinary tract infection) [N39.0] 10/04/2017  . Anemia [D64.9] 10/04/2017  . Renal insufficiency [N28.9] 10/04/2017  . Paroxysmal A-fib ([place]) [I48.0] 01/11/2017  . Influenza A [J10.1] 01/09/2017  . COPD (chronic obstructive pulmonary disease) ([place]) [J44.9] 01/08/2017  . Dementia [F03.90] 09/13/2016  . Chest pain, rule out acute myocardial infarction [R07.9] 09/12/2016  . Fever [R50.9] 03/07/2013  . Acute encephalopathy [G93.40] 03/07/2013  . Weakness generalized [R53.1] 03/07/2013  . HYPERCHOLESTEROLEMIA  IIA [E78.00] 04/09/2009  . Essential hypertension [I10] 04/09/2009  . Coronary artery disease due to lipid rich plaque [I25.10, I25.83] 04/09/2009  . COPD exacerbation ([place]) [J44.1] 04/09/2009  . OSTEOARTHRITIS [M19.90] 04/09/2009  . BENIGN PROSTATIC HYPERTROPHY, HX OF [Z87.898] 04/09/2009    Total Time spent with patient: 1 hour  Subjective:   NIKITAS DAVTYAN is a 83 y.o. male patient admitted with altered mental state and questionable over rmediation by family .  HPI:  Connor Meacham is a 83 year old male with parkinson disease and visual hallucination seen by a neurologist at [facility], [place] and was placed on medications - depakote, risperidone and clonazepam. Patient is a poor historian and  information obtained from charts, staff RN, LCSW and review of medical charts. Patient family called home health who saw me with altered mental and physical state and called EMS. Patient daughter, son and EMS suspects he was over medicated by his wife.   Patient has hard hearing and does not followed directions, repeatedly calling his daughter name and asking to keep him get up from bed. Reportedly he has no strengh and balance to walk for the last three weeks. Patient is awake, not alert, poorly oriented. He has mask like face and rigid posturing of both legs and hands and not able to move with instruction. Patient has UTI which is treated with antibiotics. Patient does not meet criteria for capacity to make his medical decision and believe his son has MVPOA. Patient may benefit from neurology evaluation for appropriate parkinson medication management. Agree with the plan of SNF placement with rehabilitation.   Past Psychiatric History: Dementia, parkinson and history of stroke. Patient has no previous history of mental illness.   Risk to Self: Is patient at risk for suicide?: No Risk to Others:   Prior Inpatient Therapy:   Prior Outpatient Therapy:    Past Medical History:  Past Medical History:  Diagnosis Date  . Anxiety   . Arthritis    "bad in his knees" (01/09/2017)  . Benign prostatic hypertrophy    hx  . Chronic airway obstruction, not elsewhere classified   . Chronic bronchitis ([place])   . Coronary atherosclerosis of unspecified type of vessel, native or graft   . GERD (gastroesophageal reflux disease)   . New onset atrial fibrillation ([place]) 01/08/2017   [name] 01/08/2017  . On home oxygen therapy    "prn" (01/09/2017)  .  Osteoarthrosis, unspecified whether generalized or localized, unspecified site   . Parkinson's disease ([place])    with dementia and psychosis.   . Pneumonia    "more than once" (01/09/2017)  . Pure hypercholesterolemia    IIA  . Skin cancer of face   . Stroke  [facility])    "they think he had a minor stroke 1-2 yr ago" (01/09/2017)  . Unspecified essential hypertension     Past Surgical History:  Procedure Laterality Date  . CERVICAL DISCECTOMY    . FRACTURE SURGERY    . SKIN CANCER DESTRUCTION Right    "face"  . TIBIA FRACTURE SURGERY Right    "he's got a pin in there"   Family History:  Family History  Problem Relation Age of Onset  . Coronary artery disease Unknown        family hx  . Stroke Mother   . Stroke Father    Family Psychiatric  History: No known family history of mental illness and reportedly his wife suffered with substance abuse and married about 61 years.  Social History:  History  Alcohol Use  . Yes    Comment: 01/09/2017 "nothing in the past several years"     History  Drug Use No    Social History   Social History  . Marital status: Married    Spouse name: N/A  . Number of children: N/A  . Years of education: N/A   Social History Main Topics  . Smoking status: Former Smoker    Years: 40.00    Types: Cigarettes    Quit date: 1979  . Smokeless tobacco: Never Used  . Alcohol use Yes     Comment: 01/09/2017 "nothing in the past several years"  . Drug use: No  . Sexual activity: No   Other Topics Concern  . None   Social History Narrative   Retired, married.    Additional Social History:    Allergies:   Allergies  Allergen Reactions  . Bee Venom Anaphylaxis    Labs:  Results for orders placed or performed during the hospital encounter of 10/03/17 (from the past 48 hour(s))  Basic metabolic panel     Status: Abnormal   Collection Time: 10/09/17  6:35 AM  Result Value Ref Range   Sodium 136 135 - 145 mmol/L   Potassium 3.9 3.5 - 5.1 mmol/L   Chloride 103 101 - 111 mmol/L   CO2 21 (L) 22 - 32 mmol/L   Glucose, Bld 88 65 - 99 mg/dL   BUN 13 6 - 20 mg/dL   Creatinine, Ser 1.01 0.61 - 1.24 mg/dL   Calcium 8.5 (L) 8.9 - 10.3 mg/dL   GFR calc non Af Amer >60 >60 mL/min   GFR calc Af Amer >60 >60  mL/min    Comment: (NOTE) The eGFR has been calculated using the CKD EPI equation. This calculation has not been validated in all clinical situations. eGFR's persistently <60 mL/min signify possible Chronic Kidney Disease.    Anion gap 12 5 - 15  CBC     Status: Abnormal   Collection Time: 10/10/17  7:41 AM  Result Value Ref Range   WBC 10.6 (H) 4.0 - 10.5 K/uL   RBC 4.07 (L) 4.22 - 5.81 MIL/uL   Hemoglobin 11.8 (L) 13.0 - 17.0 g/dL   HCT 33.9 (L) 39.0 - 52.0 %   MCV 83.3 78.0 - 100.0 fL   MCH 29.0 26.0 - 34.0 pg   MCHC 34.8 30.0 -  36.0 g/dL   RDW 13.9 11.5 - 15.5 %   Platelets 242 150 - 400 K/uL  Basic metabolic panel     Status: Abnormal   Collection Time: 10/10/17  7:41 AM  Result Value Ref Range   Sodium 134 (L) 135 - 145 mmol/L   Potassium 3.8 3.5 - 5.1 mmol/L   Chloride 102 101 - 111 mmol/L   CO2 24 22 - 32 mmol/L   Glucose, Bld 97 65 - 99 mg/dL   BUN 16 6 - 20 mg/dL   Creatinine, Ser 1.01 0.61 - 1.24 mg/dL   Calcium 8.1 (L) 8.9 - 10.3 mg/dL   GFR calc non Af Amer >60 >60 mL/min   GFR calc Af Amer >60 >60 mL/min    Comment: (NOTE) The eGFR has been calculated using the CKD EPI equation. This calculation has not been validated in all clinical situations. eGFR's persistently <60 mL/min signify possible Chronic Kidney Disease.    Anion gap 8 5 - 15    Current Facility-Administered Medications  Medication Dose Route Frequency Provider Last Rate Last Dose  . 0.9 %  sodium chloride infusion   Intravenous Continuous Lovey Newcomer T, NP 50 mL/hr at 10/08/17 1517    . acetaminophen (TYLENOL) tablet 650 mg  650 mg Oral Q6H PRN Jani Gravel, MD   650 mg at 10/09/17 1733   Or  . acetaminophen (TYLENOL) suppository 650 mg  650 mg Rectal Q6H PRN Jani Gravel, MD      . carbidopa-levodopa (SINEMET IR) 25-100 MG per tablet immediate release 1 tablet  1 tablet Oral TID Jani Gravel, MD   1 tablet at 10/10/17 1052  . divalproex (DEPAKOTE) DR tablet 125 mg  125 mg Oral Daily Caren Griffins, MD   125 mg at 10/09/17 1049  . enoxaparin (LOVENOX) injection 40 mg  40 mg Subcutaneous Daily Jani Gravel, MD   40 mg at 10/10/17 1051  . finasteride (PROSCAR) tablet 5 mg  5 mg Oral Daily Jani Gravel, MD   5 mg at 10/10/17 1052  . isosorbide mononitrate (IMDUR) 24 hr tablet 15 mg  15 mg Oral Daily Jani Gravel, MD   15 mg at 10/09/17 1050  . levofloxacin (LEVAQUIN) tablet 500 mg  500 mg Oral Daily Rai, Ripudeep K, MD      . losartan (COZAAR) tablet 50 mg  50 mg Oral Daily Jani Gravel, MD   50 mg at 10/10/17 1055  . MEDLINE mouth rinse  15 mL Mouth Rinse BID Caren Griffins, MD   15 mL at 10/09/17 2107  . naphazoline-glycerin (CLEAR EYES) ophth solution 1-2 drop  1-2 drop Both Eyes QID PRN Caren Griffins, MD   2 drop at 10/09/17 1830  . pravastatin (PRAVACHOL) tablet 40 mg  40 mg Oral Daily Jani Gravel, MD   40 mg at 10/10/17 1053  . tamsulosin (FLOMAX) capsule 0.4 mg  0.4 mg Oral Daily Jani Gravel, MD   0.4 mg at 10/10/17 1054    Musculoskeletal: Strength & Muscle Tone: decreased Gait & Station: unable to stand Patient leans: N/A  Psychiatric Specialty Exam: Physical Exam as per history and physical  ROS unable to obtain due to current altered mental state  Blood pressure 140/60, pulse 69, temperature 99.2 F (37.3 C), temperature source Oral, resp. rate 20, height '5\' 10"'$  (1.778 m), weight 68.8 kg (151 lb 9.6 oz), SpO2 96 %.Body mass index is 21.75 kg/m.  General Appearance: Guarded  Eye Contact:  Fair  Speech:  Blocked and Slow  Volume:  Decreased  Mood:  Depressed  Affect:  Flat and mask like face noted  Thought Process:  NA  Orientation:  Other:  he is able to identify that  he is in hospital and his daughter and called her name.  Thought Content:  Hallucinations: Visual, Paranoid Ideation and Rumination  Suicidal Thoughts:  No  Homicidal Thoughts:  No  Memory:  NA  Judgement:  NA  Insight:  Lacking  Psychomotor Activity:  Decreased  Concentration:  Concentration:  Poor and Attention Span: Poor  Recall:  [employer] of Knowledge:  Fair  Language:  Fair  Akathisia:  Negative  Handed:  Right  AIMS (if indicated):     Assets:  Leisure Time Microbiologist  ADL's:  Impaired  Cognition:  Impaired,  Severe  Sleep:        Treatment Plan Summary: Daily contact with patient to assess and evaluate symptoms and progress in treatment and Medication management   Dementia with hallucination due to Parkinson disease Questionable delirium due to UTI  Case discussed with Dr. Tana Coast about my below recommendations, who agrees.   Recommendation:  Patient does not meet criteria for capacity to make his own medical decisions and living arrangements. Contact his son with [facility] for placement needs Consult neurology for medication management of parkinson disease Risperidone 0.25 mg PO BID for psychosis Consider Clonazepm 0.5 mg at bed time only Discontinue depakote until further evaluated by neurology Benefit from SNF with rehabilitaion as he needs 24/7 care at this time when medically stable  Disposition: Patient does not meet criteria for psychiatric inpatient admission. Supportive therapy provided about ongoing stressors.  Ambrose Finland, MD 10/10/2017 11:26 AM

## 2017-10-10 NOTE — Consult Note (Signed)
Consultation Note Date: 10/10/2017   Patient Name: Terry Taylor  DOB: Dec 18, 1928  MRN: [id number]  Age / Sex: 83 y.o., male  PCP: Remi Haggard, FNP Referring Physician: Mendel Corning, MD  Reason for Consultation: Establishing goals of care  HPI/Patient Profile: 83 y.o. male  with past medical history of CVA, GERD, afib, HLD, HTN, ?Parkinsons, and COPD on home O2 admitted on 10/03/2017 with AMS. Concern for wife overmedicating him with Risperdal. Pt also diagnosed with UTI and treated with IV abx. Daughter is concerned that mother has been overmedicating the patient to keep him calm - she is unsure why he was started on depakote, klonopin, and risperdal. Daughter reports he has had concerning features of dementia for past several months. MRI of brain showed atrophy, no acute findings. Plans for pt to be [facility]'d to [facility] soon.   Clinical Assessment and Goals of Care: Kathie Rhodes, NP and Vinie Sill, NP met with patient's son and daughter. They shared with [place] how patient had small decline over the past year - increasingly forgetful, limited mobility - but showed significant decline over the past month while patient's son was on vacation. They are fearful that patient's spouse has been overmedicating patient and that is the cause of his acute decline. They find this situation particularly difficult to accept because it does not appear to be the natural trajectory of his illness, but is combined with overmedication by patient's spouse. There is a long history of distrust of Mr. Barkley Boards wife (their mother).  According to children, in August patient could dress himself and ambulate with walker. He did need assistance with bathing. Daughter and son expressed that they want their dad treated but if he were to experience cardiac or pulmonary arrest they would not want him resuscitated - they would want him to "die in peace". They say that he has  worked to hard his entire life to have to suffer in any way. Allowed family time and space to express frustrations with this difficult situation. Emotional support provided.   Primary Decision Maker [facility] - son, [name] and daughter, [name]    SUMMARY OF RECOMMENDATIONS   - change code status to DNR - SNF w/ palliative care following - please request this in D/C summary - continued discussions about disease (Parkinsons/dementia) trajectory  Code Status/Advance Care Planning:  DNR   Symptom Management:   None  Palliative Prophylaxis:   Aspiration, Frequent Pain Assessment, Oral Care and Turn Reposition  Additional Recommendations (Limitations, Scope, Preferences):  Full Scope Treatment and DNR  Psycho-social/Spiritual:   Desire for further Chaplaincy support:no  Additional Recommendations: Caregiving  Support/Resources and Education on Hospice  Prognosis:   < 6 months and could be much less with continued decline and poor intake  Discharge Planning: [place] for rehab with Palliative care service follow-up      Primary Diagnoses: Present on Admission: . Overdose   I have reviewed the medical record, interviewed the patient and family, and examined the patient. The following aspects are pertinent.  Past Medical History:  Diagnosis Date  . Anxiety   . Arthritis    "bad in his knees" (01/09/2017)  . Benign prostatic hypertrophy    hx  . Chronic airway obstruction, not elsewhere classified   . Chronic bronchitis ([place])   . Coronary atherosclerosis of unspecified type of vessel, native or graft   . GERD (gastroesophageal reflux disease)   . New onset atrial fibrillation ([place]) 01/08/2017   [name] 01/08/2017  .  On home oxygen therapy    "prn" (01/09/2017)  . Osteoarthrosis, unspecified whether generalized or localized, unspecified site   . Parkinson's disease ([place])    with dementia and psychosis.   . Pneumonia    "more than once" (01/09/2017)  . Pure  hypercholesterolemia    IIA  . Skin cancer of face   . Stroke [facility])    "they think he had a minor stroke 1-2 yr ago" (01/09/2017)  . Unspecified essential hypertension    Social History   Social History  . Marital status: Married    Spouse name: N/A  . Number of children: N/A  . Years of education: N/A   Social History Main Topics  . Smoking status: Former Smoker    Years: 40.00    Types: Cigarettes    Quit date: 1979  . Smokeless tobacco: Never Used  . Alcohol use Yes     Comment: 01/09/2017 "nothing in the past several years"  . Drug use: No  . Sexual activity: No   Other Topics Concern  . None   Social History Narrative   Retired, married.    Family History  Problem Relation Age of Onset  . Coronary artery disease Unknown        family hx  . Stroke Mother   . Stroke Father    Scheduled Meds: . carbidopa-levodopa  1 tablet Oral TID  . divalproex  125 mg Oral Daily  . enoxaparin (LOVENOX) injection  40 mg Subcutaneous Daily  . finasteride  5 mg Oral Daily  . isosorbide mononitrate  15 mg Oral Daily  . levofloxacin  500 mg Oral Daily  . losartan  50 mg Oral Daily  . mouth rinse  15 mL Mouth Rinse BID  . pravastatin  40 mg Oral Daily  . tamsulosin  0.4 mg Oral Daily   Continuous Infusions: . sodium chloride 50 mL/hr at 10/10/17 1232   PRN Meds:.acetaminophen **OR** acetaminophen, naphazoline-glycerin Allergies  Allergen Reactions  . Bee Venom Anaphylaxis   Review of Systems  Constitutional: Positive for activity change, appetite change, fatigue, fever and unexpected weight change.  Skin: Positive for wound.       Sacral pressure ulcer  Neurological: Positive for tremors, speech difficulty and weakness.  Psychiatric/Behavioral: Positive for agitation, behavioral problems, confusion, decreased concentration and hallucinations. The patient is nervous/anxious.     Physical Exam  Constitutional: He appears well-developed. He has a sickly appearance.  HENT:   Head: Normocephalic and atraumatic.  Eyes:  Lt lower eyelid red, inflammed  Cardiovascular: Normal rate and regular rhythm.   Pulmonary/Chest: Effort normal. No accessory muscle usage. No tachypnea. No respiratory distress.  Abdominal: Soft. Normal appearance.  Neurological: He is alert. He is disoriented.  Skin:  Thin, frail  Psychiatric:  Smiles, laughs in conversation  Nursing note and vitals reviewed.   Vital Signs: BP 140/60 (BP Location: Left Arm)   Pulse 69   Temp 99.2 F (37.3 C) (Oral)   Resp 20   Ht '5\' 10"'$  (1.778 m)   Wt 68.8 kg (151 lb 9.6 oz)   SpO2 96%   BMI 21.75 kg/m  Pain Assessment: PAINAD POSS *See Group Information*: 1-Acceptable,Awake and alert Pain Score: Asleep   SpO2: SpO2: 96 % O2 Device:SpO2: 96 % O2 Flow Rate: .   IO: Intake/output summary:  Intake/Output Summary (Last 24 hours) at 10/10/17 1326 Last data filed at 10/10/17 1232  Gross per 24 hour  Intake  480 ml  Output                0 ml  Net              480 ml    LBM: Last BM Date: 10/07/17 Baseline Weight: Weight: 74.4 kg (164 lb) Most recent weight: Weight: 68.8 kg (151 lb 9.6 oz)     Palliative Assessment/Data: 30%     Time In: 13:30 Time Out: 15:00 Time Total: 90 minutes Greater than 50%  of this time was spent counseling and coordinating care related to the above assessment and plan.  Juel Burrow, DNP, AGNP-C Palliative Medicine Team [phone]  Vinie Sill, NP Palliative Medicine Team Pager # [phone] (M-F 8a-5p) Team Phone # [phone] (Nights/Weekends)

## 2017-10-10 NOTE — Progress Notes (Addendum)
[facility]                                                                              Patient Demographics  Terry Taylor, is a 83 y.o. male, DOB - June 14, 1928, XNA:[id number]  Admit date - 10/03/2017   Admitting Physician Jani Gravel, MD  Outpatient Primary MD for the patient is Remi Haggard, FNP  Outpatient specialists:   LOS - 6  days   Medical records reviewed and are as summarized below:    Chief Complaint  Patient presents with  . Failure To Thrive       Brief summary   83 year old male with prior history of CVA, paroxysmal A. fib, coronary artery disease, hypertension, COPD, who was brought in with acute encephalopathy and with concerns about patient being overmedicated by his wife.  His home medications were held, he was also found to have a UTI and he was started on treatment.  He is improving, SNF placement is pending at this time   Assessment & Plan     Acute metabolic encephalopathy - Likely due to combination of UTI, over medications at home/Polypharmacy, unsure of baseline - Patient's family ( daughter, son) concern that patient's wife had been overmedicating the patient, patient appeared to have been placed on Depakote, Klonopin, Risperdal and not sure about the medical needs for these medications - Patient's family also reported dementia for the last several months - Decreased Depakote and continue to hold Klonopin and discontinued Risperdal.  - MRI of the brain showed atrophic, no infarct - TSH normal 1.3, B12 561, RPR negative - patient alert and awake however still somewhat confused, expecting that this is probably his new baseline. I have requested psychiatry to evaluate patient.  - placed palliative consult for goals of care.   Active Problems:    UTI (urinary tract infection) - urine culture showed pansensitive Escherichia coli, initially placed on ceftriaxone, narrowed to Keflex. Patient spiked low-grade fever yesterday,  as received adequate antibiotic dosing for UTI. -chest x-ray today showed no pneumonia. Changed antibiotic to Levaquin.     Anemia Of chronic disease - H&H stable.  Chronic kidney disease stage III - Currently stable, baseline creatinine 1.1-1.4 - Meloxicam discontinued - creatinine stable 1.0    PAF (paroxysmal atrial fibrillation) ([place]) - noted in January 2018, not a good candidate for anticoagulation due to high fall risk - Continue aspirin - Rate controlled    Benign essential HTN - continue losartan    Parkinson disease ([place]), dementia - Continue Sinemet, outpatient neurology follow-up and outpatient neuropsychiatry testing recommended    Hypoalbuminemia due to protein-calorie malnutrition ([place]) - continue nutritional supplements    Pressure injury of skin - Stage II left buttocks, wound care  Code Status: full  DVT Prophylaxis:  Lovenox  Family Communication: no family member at the bedside  Disposition Plan: awaiting skilled nursing facility. Pending psychiatry consult  Time Spent in minutes   25 minutes  Procedures:  None   Consultants:   none  Antimicrobials:      Medications  Scheduled Meds: . carbidopa-levodopa  1 tablet Oral TID  . ciprofloxacin  500 mg  Oral BID  . divalproex  125 mg Oral Daily  . enoxaparin (LOVENOX) injection  40 mg Subcutaneous Daily  . finasteride  5 mg Oral Daily  . isosorbide mononitrate  15 mg Oral Daily  . losartan  50 mg Oral Daily  . mouth rinse  15 mL Mouth Rinse BID  . pravastatin  40 mg Oral Daily  . tamsulosin  0.4 mg Oral Daily   Continuous Infusions: . sodium chloride 50 mL/hr at 10/08/17 1517   PRN Meds:.acetaminophen **OR** acetaminophen, naphazoline-glycerin   Antibiotics   Anti-infectives    Start     Dose/Rate Route Frequency Ordered Stop   10/10/17 1000  ciprofloxacin (CIPRO) tablet 500 mg     500 mg Oral 2 times daily 10/10/17 0947     10/10/17 0000  ciprofloxacin (CIPRO) 500 MG tablet      500 mg Oral 2 times daily 10/10/17 0950     10/09/17 0000  cephALEXin (KEFLEX) 500 MG capsule     500 mg Oral Every 8 hours 10/09/17 1014     10/06/17 1400  cephALEXin (KEFLEX) capsule 500 mg  Status:  Discontinued     500 mg Oral Every 8 hours 10/06/17 1115 10/10/17 0947   10/04/17 0430  cefTRIAXone (ROCEPHIN) 1 g in dextrose 5 % 50 mL IVPB  Status:  Discontinued     1 g 100 mL/hr over 30 Minutes Intravenous Every 24 hours 10/04/17 0427 10/06/17 1115   10/04/17 0145  cefTRIAXone (ROCEPHIN) 1 g in dextrose 5 % 50 mL IVPB     1 g 100 mL/hr over 30 Minutes Intravenous  Once 10/04/17 0136 10/04/17 0244        Subjective:   Terry Taylor was seen and examined today. No complaints, oriented to self, low-grade fever overnight 100.40F.  Difficult to obtain review of system from the patient due to his mental status.   Objective:   Vitals:   10/09/17 2107 10/10/17 0100 10/10/17 0514 10/10/17 0950  BP: (!) 102/58 (!) 146/103 (!) 148/57 140/60  Pulse: 78 75 73 69  Resp: 18 17 15 20   Temp: (!) 100.4 F (38 C) 99 F (37.2 C) 99.3 F (37.4 C) 99.2 F (37.3 C)  TempSrc: Axillary Axillary Axillary Oral  SpO2: 98% 99% 95% 96%  Weight:   68.8 kg (151 lb 9.6 oz)   Height:        Intake/Output Summary (Last 24 hours) at 10/10/17 1006 Last data filed at 10/10/17 0800  Gross per 24 hour  Intake              300 ml  Output                0 ml  Net              300 ml     Wt Readings from Last 3 Encounters:  10/10/17 68.8 kg (151 lb 9.6 oz)  09/20/16 74.8 kg (164 lb 12.8 oz)  09/13/16 72.8 kg (160 lb 6.4 oz)     Exam General: Alert and oriented x1, NAD, comfortable Eyes: , HEENT:   Cardiovascular: S1 S2 auscultated,RRR. No pedal edema b/l Respiratory: Clear to auscultation bilaterally, no wheezing, rales or rhonchi Gastrointestinal: Soft, nontender, nondistended, + bowel sounds Ext: no pedal edema bilaterally Neuro: moving all 4 extremities Musculoskeletal: No digital  cyanosis, clubbing Skin: No rashes Psych: somewhat confused, oriented 1 to self     Data Reviewed:  I have personally reviewed following labs and  imaging studies  Micro Results Recent Results (from the past 240 hour(s))  Urine culture     Status: Abnormal   Collection Time: 10/04/17 12:55 AM  Result Value Ref Range Status   Specimen Description URINE, RANDOM  Final   Special Requests NONE  Final   Culture >=100,000 COLONIES/mL ESCHERICHIA COLI (A)  Final   Report Status 10/06/2017 FINAL  Final   Organism ID, Bacteria ESCHERICHIA COLI (A)  Final      Susceptibility   Escherichia coli - MIC*    AMPICILLIN <=2 SENSITIVE Sensitive     CEFAZOLIN <=4 SENSITIVE Sensitive     CEFTRIAXONE <=1 SENSITIVE Sensitive     CIPROFLOXACIN <=0.25 SENSITIVE Sensitive     GENTAMICIN <=1 SENSITIVE Sensitive     IMIPENEM <=0.25 SENSITIVE Sensitive     NITROFURANTOIN <=16 SENSITIVE Sensitive     TRIMETH/SULFA <=20 SENSITIVE Sensitive     AMPICILLIN/SULBACTAM <=2 SENSITIVE Sensitive     PIP/TAZO <=4 SENSITIVE Sensitive     Extended ESBL NEGATIVE Sensitive     * >=100,000 COLONIES/mL ESCHERICHIA COLI  MRSA PCR Screening     Status: None   Collection Time: 10/04/17  6:30 PM  Result Value Ref Range Status   MRSA by PCR NEGATIVE NEGATIVE Final    Comment:        The GeneXpert MRSA Assay (FDA approved for NASAL specimens only), is one component of a comprehensive MRSA colonization surveillance program. It is not intended to diagnose MRSA infection nor to guide or monitor treatment for MRSA infections.     Radiology Reports Dg Chest 2 View  Result Date: 10/03/2017 CLINICAL DATA:  Lethargy EXAM: CHEST  2 VIEW COMPARISON:  01/08/2017 FINDINGS: Low lung volumes with mild bibasilar atelectasis. No pleural effusion. No focal consolidation. Normal cardiomediastinal silhouette with atherosclerosis. No pneumothorax. IMPRESSION: Low lung volumes.  No focal infiltrate. Electronically Signed   By: Donavan Foil M.D.   On: 10/03/2017 23:41   Mr Jeri Cos TM Contrast  Result Date: 10/05/2017 CLINICAL DATA:  Altered mental status. EXAM: MRI HEAD WITHOUT AND WITH CONTRAST TECHNIQUE: Multiplanar, multiecho pulse sequences of the brain and surrounding structures were obtained without and with intravenous contrast. CONTRAST:  <See Chart> MULTIHANCE GADOBENATE DIMEGLUMINE 529 MG/ML IV SOLN COMPARISON:  CT press.  Ray. FINDINGS: Brain: The images demonstrate acute or subacute infarct. Moderate and white matter changes are stable, for age. No hemorrhage or mass lesion present but to the degree of atrophy. The brainstem cerebellum normal. The auditory canals are bilaterally. Postcontrast images demonstrate no pathologic enhancement. Vascular: Flow is present in the major intracranial arteries. Skull and upper cervical spine: Skullbase is within limits. Craniocervical junction is normal. Grade 1 anterolisthesis at C2-3 is stable. Marrow signal is unremarkable. Sinuses/Orbits: Mild mucosal thickening is present within the anterior ethmoid air cells bilaterally. The remaining paranasal sinuses and the mastoid air cells are clear. The globes and orbits are within normal limits bilaterally. IMPRESSION: 1. Normal MRI the brain for age. 2. Minimal anterior ethmoid sinus disease. Electronically Signed   By: San Morelle M.D.   On: 10/05/2017 13:25    Lab Data:  CBC:  Recent Labs Lab 10/03/17 2220 10/04/17 0438 10/05/17 0854 10/06/17 0231 10/07/17 0518 10/10/17 0741  WBC 7.9 8.1 7.4 7.9 8.6 10.6*  NEUTROABS 5.8  --   --   --   --   --   HGB 12.5* 12.8* 13.5 12.9* 13.7 11.8*  HCT 37.3* 38.5* 39.8 39.2 40.0 33.9*  MCV  85.4 86.1 86.1 86.0 84.6 83.3  PLT 230 216 237 230 233 638   Basic Metabolic Panel:  Recent Labs Lab 10/05/17 0854 10/06/17 0231 10/07/17 0518 10/09/17 0635 10/10/17 0741  NA 138 139 136 136 134*  K 4.2 3.9 3.7 3.9 3.8  CL 104 107 103 103 102  CO2 21* 22 23 21* 24  GLUCOSE 75  108* 101* 88 97  BUN 23* 27* 16 13 16   CREATININE 1.18 1.28* 0.93 1.01 1.01  CALCIUM 8.5* 8.3* 8.4* 8.5* 8.1*   GFR: Estimated Creatinine Clearance: 50.1 mL/min (by C-G formula based on SCr of 1.01 mg/dL). Liver Function Tests:  Recent Labs Lab 10/03/17 2220 10/04/17 0438 10/06/17 0231 10/07/17 0518  AST 11* 12* 12* 13*  ALT <5* <5* 11* <5*  ALKPHOS 49 48 46 49  BILITOT 1.0 1.2 0.7 1.0  PROT 5.7* 6.2* 5.4* 5.8*  ALBUMIN 3.2* 3.3* 2.9* 3.1*   No results for input(s): LIPASE, AMYLASE in the last 168 hours. No results for input(s): AMMONIA in the last 168 hours. Coagulation Profile: No results for input(s): INR, PROTIME in the last 168 hours. Cardiac Enzymes: No results for input(s): CKTOTAL, CKMB, CKMBINDEX, TROPONINI in the last 168 hours. BNP (last 3 results) No results for input(s): PROBNP in the last 8760 hours. HbA1C: No results for input(s): HGBA1C in the last 72 hours. CBG: No results for input(s): GLUCAP in the last 168 hours. Lipid Profile: No results for input(s): CHOL, HDL, LDLCALC, TRIG, CHOLHDL, LDLDIRECT in the last 72 hours. Thyroid Function Tests: No results for input(s): TSH, T4TOTAL, FREET4, T3FREE, THYROIDAB in the last 72 hours. Anemia Panel: No results for input(s): VITAMINB12, FOLATE, FERRITIN, TIBC, IRON, RETICCTPCT in the last 72 hours. Urine analysis:    Component Value Date/Time   COLORURINE YELLOW 10/04/2017 0055   APPEARANCEUR CLOUDY (A) 10/04/2017 0055   APPEARANCEUR Clear 10/08/2014 0848   LABSPEC 1.017 10/04/2017 0055   LABSPEC 1.009 10/08/2014 0848   PHURINE 5.0 10/04/2017 0055   GLUCOSEU NEGATIVE 10/04/2017 0055   GLUCOSEU Negative 10/08/2014 0848   HGBUR LARGE (A) 10/04/2017 0055   BILIRUBINUR NEGATIVE 10/04/2017 0055   BILIRUBINUR Negative 10/08/2014 0848   KETONESUR 5 (A) 10/04/2017 0055   PROTEINUR 100 (A) 10/04/2017 0055   UROBILINOGEN 1.0 03/06/2013 1924   NITRITE POSITIVE (A) 10/04/2017 0055   LEUKOCYTESUR LARGE (A)  10/04/2017 0055   LEUKOCYTESUR Negative 10/08/2014 0848     Ripudeep Rai M.D. [facility] 10/10/2017, 10:06 AM  Pager: [phone] Between 7am to 7pm - call Pager - [phone]  After 7pm go to [email or web address] - password [email or web address]  Call night coverage person covering after 7pm

## 2017-10-10 NOTE — Consult Note (Signed)
           [facility] CM Primary Care Navigator  10/10/2017  Terry Taylor November 23, 1928 [id number]   Went to see patient at the bedside to identify possible discharge needs but psychiatrist is in the room assessing and evaluating patient at this time.  Will meet with patient at another time when he is available in the room.     Addendum: (10/11/17):  Seen patient, daughter Terry Taylor) and son (Terry Taylor) at the bedside to identify possible discharge needs.  Daughter reports of concerns about patient being overmedicated by his wife and had significant change in mental status that hadled to this admission.  Patient's daughter confirms that Dr. Frazier Taylor with [facility] in [place] is theprimary care provider.   Daughter Terry Taylor in [place] to obtain medications without difficulty. Daughter mentioned that pharmacy and primary care provider may be changed later to avoid wife having access to patient's health information and medications.  Son states that her mother was managing patient's medications before, but son eventually has to do it. He managesmedications for patient straight out of the containers.  According to son, he has been providing transportationto his doctors'appointments but mother was doing it sometimes.  Patient's children report that mother has been the primary caregiver for patient, but son has recently been assisting patient with his care needs.    Anticipated discharge plan is skilled nursing facility (SNF) for rehabilitation with Palliative care service follow-up.  Daughter mentioned that skilled nursing facility placement will more likely be a long term placement for patient for his safety.  Patient's children voiced understanding to follow-up with primary care provider if patientwill return backhome,for a post discharge follow-up within 1-2 weeks or sooner if needed. Patient letter (with PCP's contact  number) was provided as areminder.   Discussed with patient's children regarding Terry Taylor CM services available for health managementat home. Bothexpressed understanding to seekreferral from primary care provider to [facility] care management ifdeemed necessary and appropriatefor services in the future-- if patient will return back home.  [facility] care management information was provided for future needs that he may have.   For questions, please contact:  Dannielle Huh, BSN, RN- [facility] Primary Care Navigator  Telephone: [phone] [place]

## 2017-10-10 NOTE — Progress Notes (Signed)
PHARMACIST - PHYSICIAN COMMUNICATION  CONCERNING:  Levaquin   RECOMMENDATION: Consider switching antibiotic to amoxicillin  - 500 mg po Q 8 hours  DESCRIPTION: Currently on Levaquin, E Coli sensitive to amoxicillin  Thank you Anette Guarneri, PharmD [phone]

## 2017-10-10 NOTE — Progress Notes (Signed)
  Speech Language Pathology Treatment: Dysphagia  Patient Details Name: Terry Taylor MRN: [id number] DOB: [birth year] Today's Date: 10/10/2017 Time: 7026-3785 SLP Time Calculation (min) (ACUTE ONLY): 10 min  Assessment / Plan / Recommendation Clinical Impression  Pt seen at bedside to assess diet tolerance and provide education. Pt had finished lunch, but was agreeable to taking a few more bites/sips. Daughter reports tolerance of current diet but poor intake. No overt s/s aspiration observed at this time. Extended oral prep was noted, but no oral residue after the swallow. ST will continue to follow to assess readiness to advance solids, and continue education as needed. Safe swallow precautions posted at [facility].   HPI HPI: ClarenceSummersis a 83 y.o.male,w Stroke, Pafib, CAD, Hypertension, Copd on home o2, apparently had altered mental status at home. There was concern for his wife giving him a whole bottle of seroquel and over medicated him. Pt was brought to ED.  In ED, Wbc 7.9, Hgb 12.5, Plt 230, Bun/creatinine 35/1.36, ua =>Wbc tntc, CT brain pending . CXR negative Pt will be admitted for altered mental status secondary to UTI and possible overdose.  MRI was normal for age.  Chest xray showing low lung volumes but no focal infilitrates.         SLP Plan  Continue with current plan of care       Recommendations  Diet recommendations: Dysphagia 2 (fine chop);Thin liquid Liquids provided via: Cup;Straw Medication Administration: Whole meds with puree Supervision: Staff to assist with self feeding Compensations: Slow rate;Small sips/bites;Follow solids with liquid Postural Changes and/or Swallow Maneuvers: Seated upright 90 degrees;Upright 30-60 min after meal                Oral Care Recommendations: Oral care BID Follow up Recommendations: Inpatient Rehab SLP Visit Diagnosis: Dysphagia, oral phase (R13.11) Plan: Continue with current plan of care        [place] [facility], CCC-SLP Speech Language Pathologist [phone]  Shonna Chock 10/10/2017, 1:02 PM

## 2017-10-10 NOTE — Progress Notes (Signed)
PT Cancellation Note  Patient Details Name: Terry Taylor MRN: [id number] DOB: 12/31/1928   Cancelled Treatment:    Reason Eval/Treat Not Completed: Other (comment). Pt currently having Psych consult and due for Palliative Consult right after. PT will continue to f/u with pt as available.    [place] 10/10/2017, 1:35 PM

## 2017-10-11 DIAGNOSIS — R410 Disorientation, unspecified: Secondary | ICD-10-CM

## 2017-10-11 DIAGNOSIS — Z515 Encounter for palliative care: Secondary | ICD-10-CM

## 2017-10-11 LAB — CREATININE, SERUM
Creatinine, Ser: 0.9 mg/dL (ref 0.61–1.24)
GFR calc non Af Amer: >60 mL/min (ref >60)

## 2017-10-11 MED ORDER — QUETIAPINE FUMARATE 25 MG PO TABS
25.0000 mg | ORAL_TABLET | Freq: Every day | ORAL | Status: DC
  Administered 2017-10-11: 25 mg via ORAL
  Filled 2017-10-11: qty 1

## 2017-10-11 MED ORDER — CARBIDOPA-LEVODOPA 25-100 MG PO TABS
2.0000 | ORAL_TABLET | Freq: Three times a day (TID) | ORAL | Status: DC
  Administered 2017-10-11 – 2017-10-12 (×4): 2 via ORAL
  Filled 2017-10-11 (×4): qty 2

## 2017-10-11 MED ORDER — ENSURE ENLIVE PO LIQD
237.0000 mL | Freq: Two times a day (BID) | ORAL | Status: DC
  Administered 2017-10-11 – 2017-10-12 (×2): 237 mL via ORAL

## 2017-10-11 NOTE — Progress Notes (Signed)
Physical Therapy Treatment Patient Details Name: Terry Taylor MRN: [id number] DOB: 1928-06-25 Today's Date: 10/11/2017    History of Present Illness 83 year old male with prior history of CVA, paroxysmal A. fib, coronary artery disease, hypertension, COPD, who was brought in with acute encephalopathy and with concerns about patient being overmedicated by his wife    PT Comments    Pt was able to better participate today and transfer to chair performed with max assist of 2 people. Pt's daughter present for session and educated on PT plan during session. Pt continues to be rigid and reports pain with all movements, unable to state a number or describe. RN was notified of pain reports. Acute PT to continue during pt's hospital stay.    Follow Up Recommendations  SNF     Recommendations for Other Services OT consult     Precautions / Restrictions Precautions Precautions: Fall Restrictions Weight Bearing Restrictions: No    Mobility  Bed Mobility Overal bed mobility: Needs Assistance   Rolling: Mod assist Sidelying to sit: Max assist       General bed mobility comments: able to roll and sit to EOB with 1 person assist today (2cd person managing IV line). cues needed for technique and sequencing with rail used and HOB 40 degrees.   Transfers Overall transfer level: Needs assistance Equipment used: None Transfers: Sit to/from [facility] Sit to Stand: Max assist;+2 physical assistance   Squat pivot transfers: Max assist;+2 physical assistance     General transfer comment: cues needed for weight shifting with standing, bil knees blocked. once standing cues/facilitation needed for full upright posture on 1st stand. on 2cd stand performed stand<>pivot transfer to chair with no LE advancement noted during transfer.         Balance Overall balance assessment: Needs assistance Sitting-balance support: Feet supported Sitting balance-Leahy Scale:  Fair Sitting balance - Comments: pt progressed from min assist to supervision with intermittent UE support on bed surface after neuro re-ed performed at bed side to work on midline orientation.      Standing balance-Leahy Scale: Zero             Cognition Arousal/Alertness: Awake/alert Behavior During Therapy: WFL for tasks assessed/performed Overall Cognitive Status: Impaired/Different from baseline Area of Impairment: Orientation;Attention;Memory;Following commands;Safety/judgement;Problem solving                 Orientation Level: Disoriented to;Place;Time;Situation Current Attention Level: Sustained Memory: Decreased recall of precautions;Decreased short-term memory Following Commands: Follows one step commands with increased time     Problem Solving: Slow processing;Decreased initiation;Difficulty sequencing;Requires verbal cues;Requires tactile cues         Pertinent Vitals/Pain Pain Assessment: 0-10 Pain Score:  (unable to rate/describe due to cognition) Pain Intervention(s): Limited activity within patient's tolerance;Monitored during session;Repositioned;Patient requesting pain meds-RN notified     PT Goals (current goals can now be found in the care plan section) Acute Rehab PT Goals Patient Stated Goal: Unable to state PT Goal Formulation: With patient/family Time For Goal Achievement: 10/20/17 Potential to Achieve Goals: Good Progress towards PT goals: Progressing toward goals    Frequency    Min 2X/week      PT Plan Current plan remains appropriate    AM-PAC PT "6 Clicks" Daily Activity  Outcome Measure  Difficulty turning over in bed (including adjusting bedclothes, sheets and blankets)?: Unable Difficulty moving from lying on back to sitting on the side of the bed? : Unable Difficulty sitting down on and standing up from a  chair with arms (e.g., wheelchair, bedside commode, etc,.)?: Unable Help needed moving to and from a bed to chair  (including a wheelchair)?: A Lot Help needed walking in hospital room?: Total Help needed climbing 3-5 steps with a railing? : Total 6 Click Score: 7    End of Session Equipment Utilized During Treatment: Gait belt Activity Tolerance: Patient tolerated treatment well;Patient limited by pain Patient left: in chair;with call bell/phone within reach;with family/visitor present Nurse Communication: Mobility status;Need for lift equipment (use of steady/sara + for back to bed) PT Visit Diagnosis: Unsteadiness on feet (R26.81);Other abnormalities of gait and mobility (R26.89);Muscle weakness (generalized) (M62.81)     Time: 0600-4599 PT Time Calculation (min) (ACUTE ONLY): 19 min  Charges:  $Therapeutic Activity: 8-22 mins            Willow Ora, PTA, CLT Acute Rehab Services Office- [phone] 10/11/17, 2:15 PM           Willow Ora 10/11/2017, 2:13 PM

## 2017-10-11 NOTE — Progress Notes (Signed)
CSW following to facilitate discharge to SNF. Per MD, discharge will occur tomorrow, after observation of med changes. CSW updated facility of admit date, and updated [employer] with change in admission date. CSW provided updated information to [place], and confirmed ability for patient to admit to SNF tomorrow.  CSW will continue to follow to facilitate discharge to SNF tomorrow.  Laveda Abbe, [place] Clinical Social Worker [phone]

## 2017-10-11 NOTE — Progress Notes (Signed)
[facility]                                                                              Patient Demographics  Terry Taylor, is a 83 y.o. male, DOB - [birth year], BMW:[id number]  Admit date - 10/03/2017   Admitting Physician Jani Gravel, MD  Outpatient Primary MD for the patient is Remi Haggard, FNP  Outpatient specialists:   LOS - 7  days   Medical records reviewed and are as summarized below:    Chief Complaint  Patient presents with  . Failure To Thrive       Brief summary   83 year old male with prior history of CVA, paroxysmal A. fib, coronary artery disease, hypertension, COPD, who was brought in with acute encephalopathy and with concerns about patient being overmedicated by his wife.  His home medications were held, he was also found to have a UTI and he was started on treatment.  He is improving, SNF placement is pending at this time   Assessment & Plan     Acute metabolic encephalopathy in the setting of polypharmacy, lewy body dementia  - Likely due to combination of UTI, over medications at home/Polypharmacy, unsure of baseline - Patient's family (daughter, son) concerned that patient's wife had been overmedicating the patient, patient appeared to have been placed on Depakote, Klonopin, Risperdal and not sure about the medical needs for these medications - MRI of the brain showed atrophic, no infarct - TSH normal 1.3, B12 561, RPR negative - requested neurology evaluation this morning, patient was seen by Dr. Orlena Sheldon who suspect patient has Lewy body dementia given parkinsonism, dementia and prominent visual hallucinations. Recommended to discontinue risperidone and increase his Sinemet dose to improve his parkinsonian symptoms however that may increase his risk of hallucinations. Also recommended low-dose Seroquel. - had a long discussion with patient's daughter and the son at the bedside, updated psychiatry recommendations and  neurological evaluation from today. Patient's son and daughter are distraught over their mother's behavior about overmedicating the patient which may have caused significant damage to the patient's brain. I also explained to them that patient has Lewy body dementia per the neurology evaluation which is also a rapidly progressive neurological disease. Overall poor prognosis. -    Active Problems:    UTI (urinary tract infection) - urine culture showed pansensitive Escherichia coli, initially placed on ceftriaxone, narrowed to Keflex. Patient spiked low-grade fever yesterday, as received adequate antibiotic dosing for UTI. -chest x-ray today showed no pneumonia.  - change antibiotic to amoxicillin per pharmacy recommendation    Anemia Of chronic disease - H&H stable.  Chronic kidney disease stage III - Currently stable, baseline creatinine 1.1-1.4 - Meloxicam discontinued - creatinine stable 1.0    PAF (paroxysmal atrial fibrillation) ([place]) - noted in January 2018, not a good candidate for anticoagulation due to high fall risk - Continue aspirin - Rate controlled    Benign essential HTN - continue losartan    Parkinson disease ([place]), dementia - Continue Sinemet, increased his Sinemet dose - outpatient neurology follow-up and outpatient neuropsychiatry testing recommended    Hypoalbuminemia due to protein-calorie malnutrition ([place]) - continue  nutritional supplements    Pressure injury of skin - Stage II left buttocks, wound care  Code Status: full  DVT Prophylaxis:  Lovenox  Family Communication:discussed in detail with the patient's son and daughter at the bedside  Disposition Plan: plan for skilled nursing severity in a.m.  Time Spent in minutes   25 minutes  Procedures:  None   Consultants:   Palliative Psychiatry Neurology  Antimicrobials:      Medications  Scheduled Meds: . amoxicillin  500 mg Oral Q8H  . carbidopa-levodopa  2 tablet Oral TID  .  clonazePAM  0.5 mg Oral QHS  . enoxaparin (LOVENOX) injection  40 mg Subcutaneous Daily  . feeding supplement (ENSURE ENLIVE)  237 mL Oral BID BM  . finasteride  5 mg Oral Daily  . isosorbide mononitrate  15 mg Oral Daily  . losartan  50 mg Oral Daily  . mouth rinse  15 mL Mouth Rinse BID  . pravastatin  40 mg Oral Daily  . QUEtiapine  25 mg Oral QHS  . tamsulosin  0.4 mg Oral Daily   Continuous Infusions: . sodium chloride 50 mL/hr at 10/10/17 1232   PRN Meds:.acetaminophen **OR** acetaminophen, naphazoline-glycerin   Antibiotics   Anti-infectives    Start     Dose/Rate Route Frequency Ordered Stop   10/11/17 0000  levofloxacin (LEVAQUIN) 500 MG tablet  Status:  Discontinued     500 mg Oral Daily 10/10/17 1330 10/10/17    10/10/17 1400  amoxicillin (AMOXIL) capsule 500 mg     500 mg Oral Every 8 hours 10/10/17 1333 10/13/17 1359   10/10/17 1015  levofloxacin (LEVAQUIN) tablet 500 mg  Status:  Discontinued     500 mg Oral Daily 10/10/17 1010 10/10/17 1331   10/10/17 1000  ciprofloxacin (CIPRO) tablet 500 mg  Status:  Discontinued     500 mg Oral 2 times daily 10/10/17 0947 10/10/17 1010   10/10/17 0000  ciprofloxacin (CIPRO) 500 MG tablet  Status:  Discontinued     500 mg Oral 2 times daily 10/10/17 0950 10/10/17    10/10/17 0000  amoxicillin (AMOXIL) 500 MG capsule     500 mg Oral Every 8 hours 10/10/17 1333     10/09/17 0000  cephALEXin (KEFLEX) 500 MG capsule  Status:  Discontinued     500 mg Oral Every 8 hours 10/09/17 1014 10/10/17    10/06/17 1400  cephALEXin (KEFLEX) capsule 500 mg  Status:  Discontinued     500 mg Oral Every 8 hours 10/06/17 1115 10/10/17 0947   10/04/17 0430  cefTRIAXone (ROCEPHIN) 1 g in dextrose 5 % 50 mL IVPB  Status:  Discontinued     1 g 100 mL/hr over 30 Minutes Intravenous Every 24 hours 10/04/17 0427 10/06/17 1115   10/04/17 0145  cefTRIAXone (ROCEPHIN) 1 g in dextrose 5 % 50 mL IVPB     1 g 100 mL/hr over 30 Minutes Intravenous  Once  10/04/17 0136 10/04/17 0244        Subjective:   Terry Taylor was seen and examined today. Much more alert and awake however still confused. Oriented to staff, no fevers.  Difficult to obtain review of system from the patient due to his mental status. Family the bedside.  Objective:   Vitals:   10/10/17 2054 10/11/17 0148 10/11/17 0635 10/11/17 1040  BP: (!) 141/71 (!) 127/55 (!) 160/79 (!) 157/77  Pulse: 66 62 74 67  Resp: 20 20 18 20   Temp: 99.1 F (37.3  C) 99.3 F (37.4 C) 99 F (37.2 C) 98.5 F (36.9 C)  TempSrc: Axillary Axillary Axillary Oral  SpO2: 96% 94% 98% 99%  Weight:      Height:        Intake/Output Summary (Last 24 hours) at 10/11/17 1202 Last data filed at 10/10/17 1232  Gross per 24 hour  Intake              240 ml  Output                0 ml  Net              240 ml     Wt Readings from Last 3 Encounters:  10/10/17 68.8 kg (151 lb 9.6 oz)  09/20/16 74.8 kg (164 lb 12.8 oz)  09/13/16 72.8 kg (160 lb 6.4 oz)     Exam   General: Alert and oriented x self, NAD  Eyes:   HEENT:    Cardiovascular: S1 S2 auscultated, Regular rate and rhythm. No pedal edema b/l  Respiratory: CTAB  Gastrointestinal: Soft, nontender, nondistended, + bowel sounds  Ext: no pedal edema bilaterally  Neuro: severe rigidity, masked facies, does not follow commands  Musculoskeletal: No digital cyanosis, clubbing  Skin: No rashes  Psych:confused, oriented x self   Data Reviewed:  I have personally reviewed following labs and imaging studies  Micro Results Recent Results (from the past 240 hour(s))  Urine culture     Status: Abnormal   Collection Time: 10/04/17 12:55 AM  Result Value Ref Range Status   Specimen Description URINE, RANDOM  Final   Special Requests NONE  Final   Culture >=100,000 COLONIES/mL ESCHERICHIA COLI (A)  Final   Report Status 10/06/2017 FINAL  Final   Organism ID, Bacteria ESCHERICHIA COLI (A)  Final      Susceptibility    Escherichia coli - MIC*    AMPICILLIN <=2 SENSITIVE Sensitive     CEFAZOLIN <=4 SENSITIVE Sensitive     CEFTRIAXONE <=1 SENSITIVE Sensitive     CIPROFLOXACIN <=0.25 SENSITIVE Sensitive     GENTAMICIN <=1 SENSITIVE Sensitive     IMIPENEM <=0.25 SENSITIVE Sensitive     NITROFURANTOIN <=16 SENSITIVE Sensitive     TRIMETH/SULFA <=20 SENSITIVE Sensitive     AMPICILLIN/SULBACTAM <=2 SENSITIVE Sensitive     PIP/TAZO <=4 SENSITIVE Sensitive     Extended ESBL NEGATIVE Sensitive     * >=100,000 COLONIES/mL ESCHERICHIA COLI  MRSA PCR Screening     Status: None   Collection Time: 10/04/17  6:30 PM  Result Value Ref Range Status   MRSA by PCR NEGATIVE NEGATIVE Final    Comment:        The GeneXpert MRSA Assay (FDA approved for NASAL specimens only), is one component of a comprehensive MRSA colonization surveillance program. It is not intended to diagnose MRSA infection nor to guide or monitor treatment for MRSA infections.     Radiology Reports Dg Chest 2 View  Result Date: 10/03/2017 CLINICAL DATA:  Lethargy EXAM: CHEST  2 VIEW COMPARISON:  01/08/2017 FINDINGS: Low lung volumes with mild bibasilar atelectasis. No pleural effusion. No focal consolidation. Normal cardiomediastinal silhouette with atherosclerosis. No pneumothorax. IMPRESSION: Low lung volumes.  No focal infiltrate. Electronically Signed   By: Donavan Foil M.D.   On: 10/03/2017 23:41   Mr Jeri Cos IH Contrast  Result Date: 10/05/2017 CLINICAL DATA:  Altered mental status. EXAM: MRI HEAD WITHOUT AND WITH CONTRAST TECHNIQUE: Multiplanar, multiecho pulse sequences of the  brain and surrounding structures were obtained without and with intravenous contrast. CONTRAST:  <See Chart> MULTIHANCE GADOBENATE DIMEGLUMINE 529 MG/ML IV SOLN COMPARISON:  CT press.  Ray. FINDINGS: Brain: The images demonstrate acute or subacute infarct. Moderate and white matter changes are stable, for age. No hemorrhage or mass lesion present but to the degree  of atrophy. The brainstem cerebellum normal. The auditory canals are bilaterally. Postcontrast images demonstrate no pathologic enhancement. Vascular: Flow is present in the major intracranial arteries. Skull and upper cervical spine: Skullbase is within limits. Craniocervical junction is normal. Grade 1 anterolisthesis at C2-3 is stable. Marrow signal is unremarkable. Sinuses/Orbits: Mild mucosal thickening is present within the anterior ethmoid air cells bilaterally. The remaining paranasal sinuses and the mastoid air cells are clear. The globes and orbits are within normal limits bilaterally. IMPRESSION: 1. Normal MRI the brain for age. 2. Minimal anterior ethmoid sinus disease. Electronically Signed   By: San Morelle M.D.   On: 10/05/2017 13:25   Dg Chest Port 1 View  Result Date: 10/10/2017 CLINICAL DATA:  Fever. EXAM: PORTABLE CHEST 1 VIEW COMPARISON:  Chest x-rays dated 10/03/2017, 01/08/2017 and 09/12/2016. FINDINGS: Study is hypoinspiratory with crowding of the perihilar bronchovascular markings. Given the low lung volumes, lungs appear clear. No pleural effusion or pneumothorax seen. Heart size and mediastinal contours appear grossly stable. Atherosclerotic changes again noted at the aortic arch. No acute or suspicious osseous finding. Degenerative changes again noted at both shoulders. IMPRESSION: Low lung volumes. No active disease. No evidence of pneumonia or pulmonary edema. Electronically Signed   By: Franki Cabot M.D.   On: 10/10/2017 10:47    Lab Data:  CBC:  Recent Labs Lab 10/05/17 0854 10/06/17 0231 10/07/17 0518 10/10/17 0741  WBC 7.4 7.9 8.6 10.6*  HGB 13.5 12.9* 13.7 11.8*  HCT 39.8 39.2 40.0 33.9*  MCV 86.1 86.0 84.6 83.3  PLT 237 230 233 250   Basic Metabolic Panel:  Recent Labs Lab 10/05/17 0854 10/06/17 0231 10/07/17 0518 10/09/17 0635 10/10/17 0741 10/11/17 0816  NA 138 139 136 136 134*  --   K 4.2 3.9 3.7 3.9 3.8  --   CL 104 107 103 103 102   --   CO2 21* 22 23 21* 24  --   GLUCOSE 75 108* 101* 88 97  --   BUN 23* 27* 16 13 16   --   CREATININE 1.18 1.28* 0.93 1.01 1.01 0.90  CALCIUM 8.5* 8.3* 8.4* 8.5* 8.1*  --    GFR: Estimated Creatinine Clearance: 56.3 mL/min (by C-G formula based on SCr of 0.9 mg/dL). Liver Function Tests:  Recent Labs Lab 10/06/17 0231 10/07/17 0518  AST 12* 13*  ALT 11* <5*  ALKPHOS 46 49  BILITOT 0.7 1.0  PROT 5.4* 5.8*  ALBUMIN 2.9* 3.1*   No results for input(s): LIPASE, AMYLASE in the last 168 hours. No results for input(s): AMMONIA in the last 168 hours. Coagulation Profile: No results for input(s): INR, PROTIME in the last 168 hours. Cardiac Enzymes: No results for input(s): CKTOTAL, CKMB, CKMBINDEX, TROPONINI in the last 168 hours. BNP (last 3 results) No results for input(s): PROBNP in the last 8760 hours. HbA1C: No results for input(s): HGBA1C in the last 72 hours. CBG: No results for input(s): GLUCAP in the last 168 hours. Lipid Profile: No results for input(s): CHOL, HDL, LDLCALC, TRIG, CHOLHDL, LDLDIRECT in the last 72 hours. Thyroid Function Tests: No results for input(s): TSH, T4TOTAL, FREET4, T3FREE, THYROIDAB in the last 72  hours. Anemia Panel: No results for input(s): VITAMINB12, FOLATE, FERRITIN, TIBC, IRON, RETICCTPCT in the last 72 hours. Urine analysis:    Component Value Date/Time   COLORURINE YELLOW 10/04/2017 0055   APPEARANCEUR CLOUDY (A) 10/04/2017 0055   APPEARANCEUR Clear 10/08/2014 0848   LABSPEC 1.017 10/04/2017 0055   LABSPEC 1.009 10/08/2014 0848   PHURINE 5.0 10/04/2017 0055   GLUCOSEU NEGATIVE 10/04/2017 0055   GLUCOSEU Negative 10/08/2014 0848   HGBUR LARGE (A) 10/04/2017 0055   BILIRUBINUR NEGATIVE 10/04/2017 0055   BILIRUBINUR Negative 10/08/2014 0848   KETONESUR 5 (A) 10/04/2017 0055   PROTEINUR 100 (A) 10/04/2017 0055   UROBILINOGEN 1.0 03/06/2013 1924   NITRITE POSITIVE (A) 10/04/2017 0055   LEUKOCYTESUR LARGE (A) 10/04/2017 0055    LEUKOCYTESUR Negative 10/08/2014 0848     Shawanda Sievert M.D. [facility] 10/11/2017, 12:02 PM  Pager: [phone] Between 7am to 7pm - call Pager - [phone]  After 7pm go to [email or web address] - password [email or web address]  Call night coverage person covering after 7pm

## 2017-10-11 NOTE — Care Management Note (Signed)
Case Management Note  Patient Details  Name: MD SMOLA MRN: [id number] Date of Birth: 07-31-1928  Subjective/Objective:                    Action/Plan: Plan is for patient to d/c to [facility] tomorrow. No further needs per CM.   Expected Discharge Date:  10/09/17               Expected Discharge Plan:  [place]  In-House Referral:  Clinical Social Work, [facility]  Discharge planning Services  CM Consult  Post Acute Care Choice:  Resumption of Svcs/PTA Provider Choice offered to:  Adult Children Tayvien Kane [phone] and Orlando Devereux [place] [phone] POA)  DME Arranged:    DME Agency:     HH Arranged:    HH Agency:   (Patient is active with [facility] Nursing services)  Status of Service:  Completed, signed off  If discussed at [place] of Stay Meetings, dates discussed:    Additional Comments:  Pollie Friar, RN 10/11/2017, 10:40 AM

## 2017-10-11 NOTE — Care Management Important Message (Signed)
Important Message  Patient Details  Name: Terry Taylor MRN: [id number] Date of Birth: 19-Dec-1928   Medicare Important Message Given:  Yes    Lindberg Zenon Abena 10/11/2017, 11:18 AM

## 2017-10-11 NOTE — Consult Note (Signed)
Reason for Consult: PD management Referring Physician: Dr. [name] is an 83 y.o. male.  HPI: Wife and family are not here.  Patient cannot give any history. He has a history of dementia.  Per records, he has had increasing hallucinations and wife may have been overmedicating him.  He was seen by psychiatry and started on Risperdal.    Past Medical History:  Diagnosis Date  . Anxiety   . Arthritis    "bad in his knees" (01/09/2017)  . Benign prostatic hypertrophy    hx  . Chronic airway obstruction, not elsewhere classified   . Chronic bronchitis ([place])   . Coronary atherosclerosis of unspecified type of vessel, native or graft   . GERD (gastroesophageal reflux disease)   . New onset atrial fibrillation ([place]) 01/08/2017   [name] 01/08/2017  . On home oxygen therapy    "prn" (01/09/2017)  . Osteoarthrosis, unspecified whether generalized or localized, unspecified site   . Parkinson's disease ([place])    with dementia and psychosis.   . Pneumonia    "more than once" (01/09/2017)  . Pure hypercholesterolemia    IIA  . Skin cancer of face   . Stroke [facility])    "they think he had a minor stroke 1-2 yr ago" (01/09/2017)  . Unspecified essential hypertension     Past Surgical History:  Procedure Laterality Date  . CERVICAL DISCECTOMY    . FRACTURE SURGERY    . SKIN CANCER DESTRUCTION Right    "face"  . TIBIA FRACTURE SURGERY Right    "he's got a pin in there"    Family History  Problem Relation Age of Onset  . Coronary artery disease Unknown        family hx  . Stroke Mother   . Stroke Father     Social History:  reports that he quit smoking about 39 years ago. His smoking use included Cigarettes. He quit after 40.00 years of use. He has never used smokeless tobacco. He reports that he drinks alcohol. He reports that he does not use drugs.  Allergies:  Allergies  Allergen Reactions  . Bee Venom Anaphylaxis    Prior to Admission medications   Medication Sig  Start Date End Date Taking? Authorizing Provider  carbidopa-levodopa (SINEMET IR) 25-100 MG tablet Take 1 tablet by mouth 3 (three) times daily.   Yes [provider]  clonazePAM (KLONOPIN) 0.5 MG tablet Take 0.5 mg by mouth 3 (three) times daily.   Yes [provider]  finasteride (PROSCAR) 5 MG tablet Take 1 tablet (5 mg total) by mouth daily. 09/14/16  Yes Ghimire, Henreitta Leber, MD  isosorbide mononitrate (IMDUR) 30 MG 24 hr tablet Take 0.5 tablets (15 mg total) by mouth daily. 09/13/16  Yes Ghimire, Henreitta Leber, MD  losartan (COZAAR) 50 MG tablet Take 1 tablet (50 mg total) by mouth daily. 03/10/13  Yes Lottie Dawson A, MD  meloxicam (MOBIC) 7.5 MG tablet Take 7.5 mg by mouth daily.   Yes [provider]  pravastatin (PRAVACHOL) 40 MG tablet Take 40 mg by mouth daily.   Yes [provider]  risperiDONE (RISPERDAL) 0.25 MG tablet Take 0.25 mg by mouth 2 (two) times daily.   Yes [provider]  tamsulosin (FLOMAX) 0.4 MG CAPS Take 0.4 mg by mouth daily.   Yes [provider]  amoxicillin (AMOXIL) 500 MG capsule Take 1 capsule (500 mg total) by mouth every 8 (eight) hours. X 3 days 10/10/17   Rai,  Ripudeep K, MD  azithromycin (ZITHROMAX) 250 MG tablet Take 1 tablet (250 mg total) by mouth daily. Patient not taking: Reported on 10/04/2017 01/11/17   Debbe Odea, MD  divalproex (DEPAKOTE) 125 MG DR tablet Take 1 tablet (125 mg total) by mouth daily. 10/09/17   Rai, Vernelle Emerald, MD  naphazoline-glycerin (CLEAR EYES) 0.012-0.2 % SOLN Place 1-2 drops into both eyes 4 (four) times daily as needed for eye irritation. 10/09/17   Rai, Vernelle Emerald, MD  oseltamivir (TAMIFLU) 30 MG capsule Take 1 capsule (30 mg total) by mouth 2 (two) times daily. Patient not taking: Reported on 10/04/2017 01/11/17   Debbe Odea, MD  predniSONE (DELTASONE) 20 MG tablet 40 mg tomorrow, 20 mg the next day and 10 mg the last day Patient not taking: Reported on 10/04/2017 01/11/17    Debbe Odea, MD    Medications:  Scheduled: . amoxicillin  500 mg Oral Q8H  . carbidopa-levodopa  1 tablet Oral TID  . clonazePAM  0.5 mg Oral QHS  . enoxaparin (LOVENOX) injection  40 mg Subcutaneous Daily  . feeding supplement (ENSURE ENLIVE)  237 mL Oral BID BM  . finasteride  5 mg Oral Daily  . isosorbide mononitrate  15 mg Oral Daily  . losartan  50 mg Oral Daily  . mouth rinse  15 mL Mouth Rinse BID  . pravastatin  40 mg Oral Daily  . risperiDONE  0.25 mg Oral BID  . tamsulosin  0.4 mg Oral Daily    Results for orders placed or performed during the hospital encounter of 10/03/17 (from the past 48 hour(s))  CBC     Status: Abnormal   Collection Time: 10/10/17  7:41 AM  Result Value Ref Range   WBC 10.6 (H) 4.0 - 10.5 K/uL   RBC 4.07 (L) 4.22 - 5.81 MIL/uL   Hemoglobin 11.8 (L) 13.0 - 17.0 g/dL   HCT 33.9 (L) 39.0 - 52.0 %   MCV 83.3 78.0 - 100.0 fL   MCH 29.0 26.0 - 34.0 pg   MCHC 34.8 30.0 - 36.0 g/dL   RDW 13.9 11.5 - 15.5 %   Platelets 242 150 - 400 K/uL  Basic metabolic panel     Status: Abnormal   Collection Time: 10/10/17  7:41 AM  Result Value Ref Range   Sodium 134 (L) 135 - 145 mmol/L   Potassium 3.8 3.5 - 5.1 mmol/L   Chloride 102 101 - 111 mmol/L   CO2 24 22 - 32 mmol/L   Glucose, Bld 97 65 - 99 mg/dL   BUN 16 6 - 20 mg/dL   Creatinine, Ser 1.01 0.61 - 1.24 mg/dL   Calcium 8.1 (L) 8.9 - 10.3 mg/dL   GFR calc non Af Amer >60 >60 mL/min   GFR calc Af Amer >60 >60 mL/min    Comment: (NOTE) The eGFR has been calculated using the CKD EPI equation. This calculation has not been validated in all clinical situations. eGFR's persistently <60 mL/min signify possible Chronic Kidney Disease.    Anion gap 8 5 - 15    Dg Chest Port 1 View  Result Date: 10/10/2017 CLINICAL DATA:  Fever. EXAM: PORTABLE CHEST 1 VIEW COMPARISON:  Chest x-rays dated 10/03/2017, 01/08/2017 and 09/12/2016. FINDINGS: Study is hypoinspiratory with crowding of the perihilar  bronchovascular markings. Given the low lung volumes, lungs appear clear. No pleural effusion or pneumothorax seen. Heart size and mediastinal contours appear grossly stable. Atherosclerotic changes again noted at the aortic arch. No acute or  suspicious osseous finding. Degenerative changes again noted at both shoulders. IMPRESSION: Low lung volumes. No active disease. No evidence of pneumonia or pulmonary edema. Electronically Signed   By: Franki Cabot M.D.   On: 10/10/2017 10:47    ROS Blood pressure (!) 160/79, pulse 74, temperature 99 F (37.2 C), temperature source Axillary, resp. rate 18, height '5\' 10"'$  (1.778 m), weight 68.8 kg (151 lb 9.6 oz), SpO2 98 %. Neurologic Examination:  Awake, alert. Not oriented to year, month, place, city, state, president.   Very slow cognitive processing. Will not follow most commands. Masked facies. Severe rigidity and bradykinesia.   No resting tremors. Gait- could not be assessed due to safety.   Assessment/Plan:  Suspect probable Lewy Body Dementia given parkinsonism, dementia, and prominent visual hallucinations.  I will increase his Sinemet dose to improve his parkinsonian symptoms.  However, that may increase risk of hallucinations.  I do not recommend Risperdal as it has significant basal ganglial dopamine receptor blocking and may be counteractive to the dopaminergic therapy.  Nuplazid would be the idea choice for the treatment of his psychosis, but may not be available in inpatient setting.   If not, then recommend switching to low dose Seroquel instead which has least basal ganglial binding among the available antipsychotics.    Rogue Jury, MD 10/11/2017, 8:40 AM

## 2017-10-12 DIAGNOSIS — B9620 Unspecified Escherichia coli [E. coli] as the cause of diseases classified elsewhere: Secondary | ICD-10-CM | POA: Diagnosis not present

## 2017-10-12 DIAGNOSIS — F0280 Dementia in other diseases classified elsewhere without behavioral disturbance: Secondary | ICD-10-CM | POA: Diagnosis not present

## 2017-10-12 DIAGNOSIS — Z66 Do not resuscitate: Secondary | ICD-10-CM | POA: Diagnosis not present

## 2017-10-12 DIAGNOSIS — R0989 Other specified symptoms and signs involving the circulatory and respiratory systems: Secondary | ICD-10-CM | POA: Diagnosis not present

## 2017-10-12 DIAGNOSIS — I959 Hypotension, unspecified: Secondary | ICD-10-CM | POA: Diagnosis not present

## 2017-10-12 DIAGNOSIS — R471 Dysarthria and anarthria: Secondary | ICD-10-CM | POA: Diagnosis not present

## 2017-10-12 DIAGNOSIS — N3000 Acute cystitis without hematuria: Secondary | ICD-10-CM | POA: Diagnosis not present

## 2017-10-12 DIAGNOSIS — D638 Anemia in other chronic diseases classified elsewhere: Secondary | ICD-10-CM | POA: Diagnosis not present

## 2017-10-12 DIAGNOSIS — E43 Unspecified severe protein-calorie malnutrition: Secondary | ICD-10-CM | POA: Diagnosis not present

## 2017-10-12 DIAGNOSIS — I4891 Unspecified atrial fibrillation: Secondary | ICD-10-CM | POA: Diagnosis not present

## 2017-10-12 DIAGNOSIS — I2510 Atherosclerotic heart disease of native coronary artery without angina pectoris: Secondary | ICD-10-CM | POA: Diagnosis not present

## 2017-10-12 DIAGNOSIS — R410 Disorientation, unspecified: Secondary | ICD-10-CM | POA: Diagnosis not present

## 2017-10-12 DIAGNOSIS — F419 Anxiety disorder, unspecified: Secondary | ICD-10-CM | POA: Diagnosis not present

## 2017-10-12 DIAGNOSIS — R4189 Other symptoms and signs involving cognitive functions and awareness: Secondary | ICD-10-CM | POA: Diagnosis not present

## 2017-10-12 DIAGNOSIS — R4182 Altered mental status, unspecified: Secondary | ICD-10-CM | POA: Diagnosis not present

## 2017-10-12 DIAGNOSIS — M6281 Muscle weakness (generalized): Secondary | ICD-10-CM | POA: Diagnosis not present

## 2017-10-12 DIAGNOSIS — N390 Urinary tract infection, site not specified: Secondary | ICD-10-CM | POA: Diagnosis not present

## 2017-10-12 DIAGNOSIS — Z85828 Personal history of other malignant neoplasm of skin: Secondary | ICD-10-CM | POA: Diagnosis not present

## 2017-10-12 DIAGNOSIS — Y92009 Unspecified place in unspecified non-institutional (private) residence as the place of occurrence of the external cause: Secondary | ICD-10-CM | POA: Diagnosis not present

## 2017-10-12 DIAGNOSIS — R279 Unspecified lack of coordination: Secondary | ICD-10-CM | POA: Diagnosis not present

## 2017-10-12 DIAGNOSIS — D649 Anemia, unspecified: Secondary | ICD-10-CM | POA: Diagnosis not present

## 2017-10-12 DIAGNOSIS — Z23 Encounter for immunization: Secondary | ICD-10-CM | POA: Diagnosis not present

## 2017-10-12 DIAGNOSIS — L89322 Pressure ulcer of left buttock, stage 2: Secondary | ICD-10-CM | POA: Diagnosis not present

## 2017-10-12 DIAGNOSIS — T50901A Poisoning by unspecified drugs, medicaments and biological substances, accidental (unintentional), initial encounter: Secondary | ICD-10-CM | POA: Diagnosis not present

## 2017-10-12 DIAGNOSIS — R1312 Dysphagia, oropharyngeal phase: Secondary | ICD-10-CM | POA: Diagnosis not present

## 2017-10-12 DIAGNOSIS — H02105 Unspecified ectropion of left lower eyelid: Secondary | ICD-10-CM | POA: Diagnosis not present

## 2017-10-12 DIAGNOSIS — N289 Disorder of kidney and ureter, unspecified: Secondary | ICD-10-CM | POA: Diagnosis not present

## 2017-10-12 DIAGNOSIS — Z8673 Personal history of transient ischemic attack (TIA), and cerebral infarction without residual deficits: Secondary | ICD-10-CM | POA: Diagnosis not present

## 2017-10-12 DIAGNOSIS — E46 Unspecified protein-calorie malnutrition: Secondary | ICD-10-CM | POA: Diagnosis not present

## 2017-10-12 DIAGNOSIS — R627 Adult failure to thrive: Secondary | ICD-10-CM | POA: Diagnosis not present

## 2017-10-12 DIAGNOSIS — G9340 Encephalopathy, unspecified: Secondary | ICD-10-CM | POA: Diagnosis not present

## 2017-10-12 DIAGNOSIS — N189 Chronic kidney disease, unspecified: Secondary | ICD-10-CM | POA: Diagnosis not present

## 2017-10-12 DIAGNOSIS — R2689 Other abnormalities of gait and mobility: Secondary | ICD-10-CM | POA: Diagnosis not present

## 2017-10-12 DIAGNOSIS — R1310 Dysphagia, unspecified: Secondary | ICD-10-CM | POA: Diagnosis not present

## 2017-10-12 DIAGNOSIS — Z79899 Other long term (current) drug therapy: Secondary | ICD-10-CM | POA: Diagnosis not present

## 2017-10-12 DIAGNOSIS — R441 Visual hallucinations: Secondary | ICD-10-CM | POA: Diagnosis not present

## 2017-10-12 DIAGNOSIS — D631 Anemia in chronic kidney disease: Secondary | ICD-10-CM | POA: Diagnosis not present

## 2017-10-12 DIAGNOSIS — Z515 Encounter for palliative care: Secondary | ICD-10-CM | POA: Diagnosis not present

## 2017-10-12 DIAGNOSIS — G20 Parkinson's disease: Secondary | ICD-10-CM | POA: Diagnosis not present

## 2017-10-12 DIAGNOSIS — I10 Essential (primary) hypertension: Secondary | ICD-10-CM | POA: Diagnosis not present

## 2017-10-12 DIAGNOSIS — J189 Pneumonia, unspecified organism: Secondary | ICD-10-CM | POA: Diagnosis not present

## 2017-10-12 DIAGNOSIS — E785 Hyperlipidemia, unspecified: Secondary | ICD-10-CM | POA: Diagnosis not present

## 2017-10-12 DIAGNOSIS — Z7409 Other reduced mobility: Secondary | ICD-10-CM | POA: Diagnosis not present

## 2017-10-12 DIAGNOSIS — G9341 Metabolic encephalopathy: Secondary | ICD-10-CM | POA: Diagnosis not present

## 2017-10-12 DIAGNOSIS — I480 Paroxysmal atrial fibrillation: Secondary | ICD-10-CM | POA: Diagnosis not present

## 2017-10-12 DIAGNOSIS — J449 Chronic obstructive pulmonary disease, unspecified: Secondary | ICD-10-CM | POA: Diagnosis not present

## 2017-10-12 DIAGNOSIS — F39 Unspecified mood [affective] disorder: Secondary | ICD-10-CM | POA: Diagnosis not present

## 2017-10-12 DIAGNOSIS — F0390 Unspecified dementia without behavioral disturbance: Secondary | ICD-10-CM | POA: Diagnosis not present

## 2017-10-12 DIAGNOSIS — G3183 Dementia with Lewy bodies: Secondary | ICD-10-CM | POA: Diagnosis not present

## 2017-10-12 MED ORDER — CLONAZEPAM 0.5 MG PO TABS: 0.5000 mg | ORAL_TABLET | Freq: Every day | ORAL | 0 refills | Status: DC

## 2017-10-12 MED ORDER — CARBIDOPA-LEVODOPA 25-100 MG PO TABS: 2.0000 | ORAL_TABLET | Freq: Three times a day (TID) | ORAL | 3 refills | Status: DC

## 2017-10-12 MED ORDER — QUETIAPINE FUMARATE 25 MG PO TABS: 25.0000 mg | ORAL_TABLET | Freq: Every day | ORAL | 0 refills | Status: DC

## 2017-10-12 MED ORDER — ENSURE ENLIVE PO LIQD: 237.0000 mL | Freq: Two times a day (BID) | ORAL | 12 refills | Status: DC

## 2017-10-12 MED ORDER — AMOXICILLIN 500 MG PO CAPS: 500.0000 mg | ORAL_CAPSULE | Freq: Three times a day (TID) | ORAL | Status: DC

## 2017-10-12 NOTE — Clinical Social Work Note (Signed)
CSW facilitated patient discharge including contacting patient family and facility to confirm patient discharge plans. Clinical information faxed to facility and family agreeable with plan. CSW arranged ambulance transport via [facility] to [facility]. RN to call report prior to discharge ([phone]).  CSW will sign off for now as social work intervention is no longer needed. Please consult us again if new needs arise.  Spiro Ausborn, CSW [phone]   

## 2017-10-12 NOTE — Progress Notes (Signed)
Subjective: Interval History: No family here.  No hallucinations overnight per nursing.    Objective: Vital signs in last 24 hours: Temp:  [98.1 F (36.7 C)-98.8 F (37.1 C)] 98.1 F (36.7 C) (10/13 0500) Pulse Rate:  [31-71] 31 (10/13 0500) Resp:  [18-20] 18 (10/13 0500) BP: (117-160)/(48-78) 120/48 (10/13 0500) SpO2:  [96 %-100 %] 99 % (10/13 0500)  Intake/Output from previous day: 10/12 0701 - 10/13 0700 In: 120 [P.O.:120] Out: -  Intake/Output this shift: No intake/output data recorded. Nutritional status: DIET DYS 2 Room service appropriate? Yes; Fluid consistency: Thin  Neurologic Exam:  Very drowsy.  Slurred speech. Masked facies. Rigidity significant BUE and BLE. Will not follow commands to test bradykinesia. No resting tremor. Gait - deferred secondary to safety.   Lab Results:  Recent Labs  10/10/17 0741 10/11/17 0816  WBC 10.6*  --   HGB 11.8*  --   HCT 33.9*  --   PLT 242  --   NA 134*  --   K 3.8  --   CL 102  --   CO2 24  --   GLUCOSE 97  --   BUN 16  --   CREATININE 1.01 0.90  CALCIUM 8.1*  --    Lipid Panel No results for input(s): CHOL, TRIG, HDL, CHOLHDL, VLDL, LDLCALC in the last 72 hours.  Studies/Results: Dg Chest Port 1 View  Result Date: 10/10/2017 CLINICAL DATA:  Fever. EXAM: PORTABLE CHEST 1 VIEW COMPARISON:  Chest x-rays dated 10/03/2017, 01/08/2017 and 09/12/2016. FINDINGS: Study is hypoinspiratory with crowding of the perihilar bronchovascular markings. Given the low lung volumes, lungs appear clear. No pleural effusion or pneumothorax seen. Heart size and mediastinal contours appear grossly stable. Atherosclerotic changes again noted at the aortic arch. No acute or suspicious osseous finding. Degenerative changes again noted at both shoulders. IMPRESSION: Low lung volumes. No active disease. No evidence of pneumonia or pulmonary edema. Electronically Signed   By: Franki Cabot M.D.   On: 10/10/2017 10:47    Medications:   Scheduled: . amoxicillin  500 mg Oral Q8H  . carbidopa-levodopa  2 tablet Oral TID  . clonazePAM  0.5 mg Oral QHS  . enoxaparin (LOVENOX) injection  40 mg Subcutaneous Daily  . feeding supplement (ENSURE ENLIVE)  237 mL Oral BID BM  . finasteride  5 mg Oral Daily  . isosorbide mononitrate  15 mg Oral Daily  . losartan  50 mg Oral Daily  . mouth rinse  15 mL Mouth Rinse BID  . pravastatin  40 mg Oral Daily  . QUEtiapine  25 mg Oral QHS  . tamsulosin  0.4 mg Oral Daily    Assessment/Plan:   Probable Lewy Body Dementia.  Parkinsonism is significant.  No major improvement with increased Sinemet dose, but too early to reap full benefits.  No major psychosis so far on low dose Seroquel.  Continue present management.     LOS: 8 days   Rogue Jury, MD 10/12/2017  9:27 AM

## 2017-10-12 NOTE — Progress Notes (Signed)
Social [phone]) notified per MD request that patient is d/c. Copy of signed DNR verified to be in chart, AVS printed and attached, scripts also attached to chart.

## 2017-10-12 NOTE — Progress Notes (Signed)
Report attempted  to [facility]. X 3 without any success [facility] arrived to transport patient to SNF daughter is at his bedside.

## 2017-10-12 NOTE — Clinical Social Work Placement (Signed)
   CLINICAL SOCIAL WORK PLACEMENT  NOTE  Date:  10/12/2017  Patient Details  Name: Terry Taylor MRN: [id number] Date of Birth: Aug 08, 1928  Clinical Social Work is seeking post-discharge placement for this patient at the [place] level of care (*CSW will initial, date and re-position this form in  chart as items are completed):  Yes   Patient/family provided with [facility] Work Department's list of facilities offering this level of care within the geographic area requested by the patient (or if unable, by the patient's family).  Yes   Patient/family informed of their freedom to choose among providers that offer the needed level of care, that participate in Medicare, Medicaid or managed care program needed by the patient, have an available bed and are willing to accept the patient.  Yes   Patient/family informed of [facility]'s ownership interest in [facility] and [facility], as well as of the fact that they are under no obligation to receive care at these facilities.  PASRR submitted to EDS on 10/08/17     PASRR number received on 10/09/17     Existing PASRR number confirmed on       FL2 transmitted to all facilities in geographic area requested by pt/family on 10/08/17     FL2 transmitted to all facilities within larger geographic area on       Patient informed that his/her managed care company has contracts with or will negotiate with certain facilities, including the following:        Yes   Patient/family informed of bed offers received.  Patient chooses bed at [facility]     Physician recommends and patient chooses bed at      Patient to be transferred to [facility] on 10/12/17.  Patient to be transferred to facility by [facility]     Patient family notified on 10/12/17 of transfer.  Name of family member notified:  Dorna Bloom     PHYSICIAN Please prepare prescriptions     Additional Comment:     _______________________________________________ Candie Chroman, LCSW 10/12/2017, 12:00 PM

## 2017-10-12 NOTE — Discharge Summary (Addendum)
Physician Discharge Summary   Patient ID: Terry Taylor MRN: [id number] DOB/AGE: [birth year] 83 y.o.  Admit date: 10/03/2017 Discharge date: 10/12/2017  Primary Care Physician:  Remi Haggard, FNP  Discharge Diagnoses:   Acute metabolic encephalopathy Polypharmacy Lewy body dementia Escherichia coli urinary tract infection And anemia of chronic disease Chronic kidney disease stage III Paroxysmal atrial fibrillation Benign essential hypertension Parkinsonism Hypoalbuminemia Moderate to severe protein calorie malnutrition Pressure injury of the skin  Consults:   Palliative medicine Neurology Psychiatry  Recommendations for Outpatient Follow-up:  1. Increased Sinemet dose to 2 tablets 3 times a day 2. Discontinued Depakote, Risperdal 3. Please repeat CBC/BMET at next visit 4. Patient is to be followed by neurology outpatient, ambulatory referral to neurology sent 5. Fall precautions, aspiration precautions  DIET: dysphagia 2 diet, thin liquids    Allergies:   Allergies  Allergen Reactions  . Bee Venom Anaphylaxis     DISCHARGE MEDICATIONS: Current Discharge Medication List    START taking these medications   Details  amoxicillin (AMOXIL) 500 MG capsule Take 1 capsule (500 mg total) by mouth every 8 (eight) hours. X 1 days    feeding supplement, ENSURE ENLIVE, (ENSURE ENLIVE) LIQD Take 237 mLs by mouth 2 (two) times daily between meals. Qty: 237 mL, Refills: 12    naphazoline-glycerin (CLEAR EYES) 0.012-0.2 % SOLN Place 1-2 drops into both eyes 4 (four) times daily as needed for eye irritation. Refills: 0    QUEtiapine (SEROQUEL) 25 MG tablet Take 1 tablet (25 mg total) by mouth at bedtime. Qty: 5 tablet, Refills: 0      CONTINUE these medications which have CHANGED   Details  carbidopa-levodopa (SINEMET IR) 25-100 MG tablet Take 2 tablets by mouth 3 (three) times daily. Qty: 120 tablet, Refills: 3    clonazePAM (KLONOPIN) 0.5 MG tablet Take  1 tablet (0.5 mg total) by mouth at bedtime. Qty: 5 tablet, Refills: 0      CONTINUE these medications which have NOT CHANGED   Details  finasteride (PROSCAR) 5 MG tablet Take 1 tablet (5 mg total) by mouth daily. Qty: 30 tablet, Refills: 0    isosorbide mononitrate (IMDUR) 30 MG 24 hr tablet Take 0.5 tablets (15 mg total) by mouth daily. Qty: 30 tablet, Refills: 0    losartan (COZAAR) 50 MG tablet Take 1 tablet (50 mg total) by mouth daily. Qty: 30 tablet, Refills: 0    pravastatin (PRAVACHOL) 40 MG tablet Take 40 mg by mouth daily.    tamsulosin (FLOMAX) 0.4 MG CAPS Take 0.4 mg by mouth daily.      STOP taking these medications     meloxicam (MOBIC) 7.5 MG tablet      risperiDONE (RISPERDAL) 0.25 MG tablet      divalproex (DEPAKOTE) 125 MG DR tablet      azithromycin (ZITHROMAX) 250 MG tablet      oseltamivir (TAMIFLU) 30 MG capsule      predniSONE (DELTASONE) 20 MG tablet          Brief H and P: For complete details please refer to admission H and P, but in brief 83 year old male with prior history of CVA, paroxysmal A. fib, coronary artery disease, hypertension, COPD, who was brought in with acute encephalopathy and with concerns about patient being overmedicated by his wife. His home medications were held, he was also found to have a UTI and he was started on treatment. He is improving, SNF placement is pending at this time   Hospital Course:  Acute metabolic encephalopathy in the setting of polypharmacy, lewy body dementia  - Likely due to combination of UTI, over medications at home/Polypharmacy - Patient's family (daughter, son) concerned that patient's wife had been overmedicating the patient, patient appeared to have been placed on Depakote, Klonopin, Risperdal and not sure about the medical needs for these medications - MRI of the brain showed atrophic, no infarct - TSH normal 1.3, B12 561, RPR negative - neurology evaluation was requested, patient was  seen by Dr. Orlena Sheldon who suspect patient has Lewy body dementia given parkinsonism, dementia and prominent visual hallucinations. Recommended to discontinue risperidone and increase his Sinemet dose to improve his parkinsonian symptoms however that may increase his risk of hallucinations. Also recommended low-dose Seroquel. Patient was also followed by psychiatry, placed on Klonopin low-dose at bedtime - had a long discussion with patient's daughter and the son at the bedside, updated psychiatry recommendations and neurological evaluation from today. Patient's son and daughter are distraught over their mother's behavior about overmedicating the patient which may have caused significant damage to the patient's brain. I also explained to them that patient has Lewy body dementia per the neurology evaluation which is also a rapidly progressive neurological disease. Overall poor prognosis.     UTI (urinary tract infection) - urine culture showed pansensitive Escherichia coli, initially placed on ceftriaxone, narrowed to Keflex. Patient spiked low-grade fever yesterday, as received adequate antibiotic dosing for UTI. -chest x-ray today showed no pneumonia.  - change antibiotic to amoxicillin per pharmacy recommendation    Anemia Of chronic disease - H&H stable.  Chronic kidney disease stage III - Currently stable, baseline creatinine 1.1-1.4 - Meloxicam discontinued - creatinine stable 1.0    PAF (paroxysmal atrial fibrillation) ([place]) - noted in January 2018, not a good candidate for anticoagulation due to high fall risk - Rate controlled    Benign essential HTN - continue losartan    Parkinsonism with Lewy body dementia - Continue Sinemet, increased his Sinemet dose - outpatient neurology follow-up and outpatient neuropsychiatry testing recommended    Hypoalbuminemia due to protein-calorie malnutrition ([place]) - continue nutritional supplements    Pressure injury of skin - Stage II  left buttocks, wound care   Day of Discharge BP (!) 145/50 (BP Location: Right Arm)   Pulse (!) 59   Temp 98 F (36.7 C) (Axillary)   Resp 18   Ht 5\' 10"  (1.778 m)   Wt 68.8 kg (151 lb 9.6 oz)   SpO2 99%   BMI 21.75 kg/m   Physical Exam: General: sleepy, comfortable, NAD HEENT: anicteric sclera, pupils reactive to light and accommodation CVS: S1-S2 clear no murmur rubs or gallops Chest: clear to auscultation bilaterally, no wheezing rales or rhonchi Abdomen: soft nontender, nondistended, normal bowel sounds Extremities: no cyanosis, clubbing or edema noted bilaterally    The results of significant diagnostics from this hospitalization (including imaging, microbiology, ancillary and laboratory) are listed below for reference.    LAB RESULTS: Basic Metabolic Panel:  Recent Labs Lab 10/09/17 0635 10/10/17 0741 10/11/17 0816  NA 136 134*  --   K 3.9 3.8  --   CL 103 102  --   CO2 21* 24  --   GLUCOSE 88 97  --   BUN 13 16  --   CREATININE 1.01 1.01 0.90  CALCIUM 8.5* 8.1*  --    Liver Function Tests:  Recent Labs Lab 10/06/17 0231 10/07/17 0518  AST 12* 13*  ALT 11* <5*  ALKPHOS 46 49  BILITOT 0.7 1.0  PROT 5.4* 5.8*  ALBUMIN 2.9* 3.1*   No results for input(s): LIPASE, AMYLASE in the last 168 hours. No results for input(s): AMMONIA in the last 168 hours. CBC:  Recent Labs Lab 10/07/17 0518 10/10/17 0741  WBC 8.6 10.6*  HGB 13.7 11.8*  HCT 40.0 33.9*  MCV 84.6 83.3  PLT 233 242   Cardiac Enzymes: No results for input(s): CKTOTAL, CKMB, CKMBINDEX, TROPONINI in the last 168 hours. BNP: Invalid input(s): POCBNP CBG: No results for input(s): GLUCAP in the last 168 hours.  Significant Diagnostic Studies:  Dg Chest 2 View  Result Date: 10/03/2017 CLINICAL DATA:  Lethargy EXAM: CHEST  2 VIEW COMPARISON:  01/08/2017 FINDINGS: Low lung volumes with mild bibasilar atelectasis. No pleural effusion. No focal consolidation. Normal cardiomediastinal  silhouette with atherosclerosis. No pneumothorax. IMPRESSION: Low lung volumes.  No focal infiltrate. Electronically Signed   By: Donavan Foil M.D.   On: 10/03/2017 23:41    2D ECHO: Study Conclusions  - Left ventricle: The cavity size was normal. Wall thickness was   increased in a pattern of moderate LVH. Systolic function was   normal. The estimated ejection fraction was in the range of 60%   to 65%. Although no diagnostic regional wall motion abnormality   was identified, this possibility cannot be completely excluded on   the basis of this study. - Left atrium: The atrium was moderately dilated.  Disposition and Follow-up: Discharge Instructions    Ambulatory referral to Neurology    Complete by:  As directed    An appointment is requested in approximately: 4 weeks, new diagnosis Lewy Body Dementia   Increase activity slowly    Complete by:  As directed    Increase activity slowly    Complete by:  As directed        DISPOSITION: SNF   DISCHARGE FOLLOW-UP  Contact information for follow-up providers    Remi Haggard, FNP. Schedule an appointment as soon as possible for a visit in 2 week(s).   Specialty:  Family Medicine Contact information: [place] [address] [phone]        [facility]. Schedule an appointment as soon as possible for a visit in 4 week(s).   Why:  neurology Contact information: [address]     [address] [place] [address] [phone]           Contact information for after-discharge care    Destination    [facility] Follow up.   Specialty:  [place] information: [address] [place] [phone]                   Time spent on Discharge: 40mins   Signed:   Estill Cotta M.D. [facility] 10/12/2017, 10:00 AM Pager: [phone]  Coding query addendum Acute renal  insufficiency: presented with a creatinine of 1.36, at the time of discharge resolved to 0.90. No chronic kidney disease   Ripudeep Rai M.D. [facility] 10/15/2017, 2:00 PM  Pager: [phone]

## 2017-10-15 ENCOUNTER — Other Ambulatory Visit: Payer: Self-pay

## 2017-10-15 NOTE — Patient Outreach (Signed)
[facility]) Care Management  10/15/2017  Terry Taylor [birth year] [id number]     Transition of Care Referral  Referral Date: 10/15/17 Referral Source: [employer] Discharge Report Date of Admission: 10/04/17 Diagnosis: acute cystitis Date of Discharge: 10/12/17 Facility: [place]: [facility]    Referral received. Noted that patient's PCP is Threasa Alpha with [facility]. MD office not listed as [facility] provider in [facility] directory.  Patient not eligible for [facility] services. Case is being closed at this time.     Plan: RN CM will notify [facility] administrative assistant of case status.    Enzo Montgomery, RN,BSN,CCM [facility] Telephonic Care Management Coordinator Direct Phone: [phone] Toll Free: [phone] Fax: [phone]

## 2017-10-17 DIAGNOSIS — N390 Urinary tract infection, site not specified: Secondary | ICD-10-CM | POA: Diagnosis not present

## 2017-10-17 DIAGNOSIS — G9341 Metabolic encephalopathy: Secondary | ICD-10-CM | POA: Diagnosis not present

## 2017-10-17 DIAGNOSIS — I480 Paroxysmal atrial fibrillation: Secondary | ICD-10-CM | POA: Diagnosis not present

## 2017-10-17 DIAGNOSIS — I10 Essential (primary) hypertension: Secondary | ICD-10-CM | POA: Diagnosis not present

## 2017-10-18 DIAGNOSIS — J449 Chronic obstructive pulmonary disease, unspecified: Secondary | ICD-10-CM | POA: Diagnosis not present

## 2017-10-18 DIAGNOSIS — F0390 Unspecified dementia without behavioral disturbance: Secondary | ICD-10-CM | POA: Diagnosis not present

## 2017-10-18 DIAGNOSIS — R1310 Dysphagia, unspecified: Secondary | ICD-10-CM | POA: Diagnosis not present

## 2017-10-18 DIAGNOSIS — N390 Urinary tract infection, site not specified: Secondary | ICD-10-CM | POA: Diagnosis not present

## 2017-10-21 DIAGNOSIS — J189 Pneumonia, unspecified organism: Secondary | ICD-10-CM | POA: Diagnosis not present

## 2017-10-21 DIAGNOSIS — Z7409 Other reduced mobility: Secondary | ICD-10-CM | POA: Diagnosis not present

## 2017-10-21 DIAGNOSIS — N390 Urinary tract infection, site not specified: Secondary | ICD-10-CM | POA: Diagnosis not present

## 2017-10-23 DIAGNOSIS — F0390 Unspecified dementia without behavioral disturbance: Secondary | ICD-10-CM | POA: Diagnosis not present

## 2017-10-23 DIAGNOSIS — N390 Urinary tract infection, site not specified: Secondary | ICD-10-CM | POA: Diagnosis not present

## 2017-10-23 DIAGNOSIS — D638 Anemia in other chronic diseases classified elsewhere: Secondary | ICD-10-CM | POA: Diagnosis not present

## 2017-10-23 DIAGNOSIS — J189 Pneumonia, unspecified organism: Secondary | ICD-10-CM | POA: Diagnosis not present

## 2017-10-30 DIAGNOSIS — J189 Pneumonia, unspecified organism: Secondary | ICD-10-CM | POA: Diagnosis not present

## 2017-10-30 DIAGNOSIS — F0390 Unspecified dementia without behavioral disturbance: Secondary | ICD-10-CM | POA: Diagnosis not present

## 2017-10-30 DIAGNOSIS — I10 Essential (primary) hypertension: Secondary | ICD-10-CM | POA: Diagnosis not present

## 2017-10-30 DIAGNOSIS — G20 Parkinson's disease: Secondary | ICD-10-CM | POA: Diagnosis not present

## 2017-11-01 DIAGNOSIS — J449 Chronic obstructive pulmonary disease, unspecified: Secondary | ICD-10-CM | POA: Diagnosis not present

## 2017-11-01 DIAGNOSIS — J189 Pneumonia, unspecified organism: Secondary | ICD-10-CM | POA: Diagnosis not present

## 2017-11-01 DIAGNOSIS — N390 Urinary tract infection, site not specified: Secondary | ICD-10-CM | POA: Diagnosis not present

## 2017-11-01 DIAGNOSIS — I480 Paroxysmal atrial fibrillation: Secondary | ICD-10-CM | POA: Diagnosis not present

## 2017-11-03 DIAGNOSIS — H02105 Unspecified ectropion of left lower eyelid: Secondary | ICD-10-CM | POA: Diagnosis not present

## 2017-11-03 DIAGNOSIS — J189 Pneumonia, unspecified organism: Secondary | ICD-10-CM | POA: Diagnosis not present

## 2017-11-03 DIAGNOSIS — R4189 Other symptoms and signs involving cognitive functions and awareness: Secondary | ICD-10-CM | POA: Diagnosis not present

## 2017-11-07 ENCOUNTER — Telehealth: Payer: Self-pay | Admitting: Diagnostic Neuroimaging

## 2017-11-07 DIAGNOSIS — H02105 Unspecified ectropion of left lower eyelid: Secondary | ICD-10-CM | POA: Diagnosis not present

## 2017-11-07 DIAGNOSIS — J189 Pneumonia, unspecified organism: Secondary | ICD-10-CM | POA: Diagnosis not present

## 2017-11-07 NOTE — Telephone Encounter (Signed)
Pt's wife called said she was contacted by the clinic reg upcoming appt. She told me the son or daughter needs to be contacted, preferably the son. She came forth saying their children are preventing her from having any contact with the pt or with them, she became tearful. She did not go into any detail. She did give the sons phone number [phone]   [facility]

## 2017-11-11 ENCOUNTER — Ambulatory Visit: Payer: Self-pay | Admitting: Diagnostic Neuroimaging

## 2017-11-12 ENCOUNTER — Encounter: Payer: Self-pay | Admitting: Diagnostic Neuroimaging

## 2017-11-12 DIAGNOSIS — H02105 Unspecified ectropion of left lower eyelid: Secondary | ICD-10-CM | POA: Diagnosis not present

## 2017-11-12 DIAGNOSIS — J189 Pneumonia, unspecified organism: Secondary | ICD-10-CM | POA: Diagnosis not present

## 2017-11-15 DIAGNOSIS — J189 Pneumonia, unspecified organism: Secondary | ICD-10-CM | POA: Diagnosis not present

## 2017-11-15 DIAGNOSIS — N189 Chronic kidney disease, unspecified: Secondary | ICD-10-CM | POA: Diagnosis not present

## 2017-11-15 DIAGNOSIS — R4189 Other symptoms and signs involving cognitive functions and awareness: Secondary | ICD-10-CM | POA: Diagnosis not present

## 2017-11-15 DIAGNOSIS — J449 Chronic obstructive pulmonary disease, unspecified: Secondary | ICD-10-CM | POA: Diagnosis not present

## 2017-11-18 DIAGNOSIS — H02105 Unspecified ectropion of left lower eyelid: Secondary | ICD-10-CM | POA: Diagnosis not present

## 2017-11-18 DIAGNOSIS — I959 Hypotension, unspecified: Secondary | ICD-10-CM | POA: Diagnosis not present

## 2017-11-18 DIAGNOSIS — J189 Pneumonia, unspecified organism: Secondary | ICD-10-CM | POA: Diagnosis not present

## 2017-11-19 DIAGNOSIS — R4189 Other symptoms and signs involving cognitive functions and awareness: Secondary | ICD-10-CM | POA: Diagnosis not present

## 2017-11-19 DIAGNOSIS — F0390 Unspecified dementia without behavioral disturbance: Secondary | ICD-10-CM | POA: Diagnosis not present

## 2017-11-25 DIAGNOSIS — I959 Hypotension, unspecified: Secondary | ICD-10-CM | POA: Diagnosis not present

## 2017-11-25 DIAGNOSIS — D638 Anemia in other chronic diseases classified elsewhere: Secondary | ICD-10-CM | POA: Diagnosis not present

## 2017-11-27 DIAGNOSIS — R471 Dysarthria and anarthria: Secondary | ICD-10-CM | POA: Diagnosis not present

## 2017-11-27 DIAGNOSIS — R2689 Other abnormalities of gait and mobility: Secondary | ICD-10-CM | POA: Diagnosis not present

## 2017-11-27 DIAGNOSIS — R1312 Dysphagia, oropharyngeal phase: Secondary | ICD-10-CM | POA: Diagnosis not present

## 2017-11-27 DIAGNOSIS — R279 Unspecified lack of coordination: Secondary | ICD-10-CM | POA: Diagnosis not present

## 2017-11-27 DIAGNOSIS — R41841 Cognitive communication deficit: Secondary | ICD-10-CM | POA: Diagnosis not present

## 2017-11-28 DIAGNOSIS — R2689 Other abnormalities of gait and mobility: Secondary | ICD-10-CM | POA: Diagnosis not present

## 2017-11-28 DIAGNOSIS — R1312 Dysphagia, oropharyngeal phase: Secondary | ICD-10-CM | POA: Diagnosis not present

## 2017-11-28 DIAGNOSIS — R471 Dysarthria and anarthria: Secondary | ICD-10-CM | POA: Diagnosis not present

## 2017-11-28 DIAGNOSIS — R279 Unspecified lack of coordination: Secondary | ICD-10-CM | POA: Diagnosis not present

## 2017-11-28 DIAGNOSIS — R41841 Cognitive communication deficit: Secondary | ICD-10-CM | POA: Diagnosis not present

## 2017-12-02 DIAGNOSIS — N189 Chronic kidney disease, unspecified: Secondary | ICD-10-CM | POA: Diagnosis not present

## 2017-12-02 DIAGNOSIS — D638 Anemia in other chronic diseases classified elsewhere: Secondary | ICD-10-CM | POA: Diagnosis not present

## 2017-12-02 DIAGNOSIS — I959 Hypotension, unspecified: Secondary | ICD-10-CM | POA: Diagnosis not present

## 2017-12-12 DIAGNOSIS — I959 Hypotension, unspecified: Secondary | ICD-10-CM | POA: Diagnosis not present

## 2017-12-12 DIAGNOSIS — H02105 Unspecified ectropion of left lower eyelid: Secondary | ICD-10-CM | POA: Diagnosis not present

## 2017-12-12 DIAGNOSIS — J189 Pneumonia, unspecified organism: Secondary | ICD-10-CM | POA: Diagnosis not present

## 2017-12-27 DIAGNOSIS — Z7409 Other reduced mobility: Secondary | ICD-10-CM | POA: Diagnosis not present

## 2017-12-27 DIAGNOSIS — R1310 Dysphagia, unspecified: Secondary | ICD-10-CM | POA: Diagnosis not present

## 2017-12-27 DIAGNOSIS — I480 Paroxysmal atrial fibrillation: Secondary | ICD-10-CM | POA: Diagnosis not present

## 2017-12-27 DIAGNOSIS — J449 Chronic obstructive pulmonary disease, unspecified: Secondary | ICD-10-CM | POA: Diagnosis not present

## 2018-01-03 DIAGNOSIS — Z9181 History of falling: Secondary | ICD-10-CM | POA: Diagnosis not present

## 2018-01-03 DIAGNOSIS — Z7409 Other reduced mobility: Secondary | ICD-10-CM | POA: Diagnosis not present

## 2018-01-08 DIAGNOSIS — Z0279 Encounter for issue of other medical certificate: Secondary | ICD-10-CM | POA: Diagnosis not present

## 2018-01-08 DIAGNOSIS — G20 Parkinson's disease: Secondary | ICD-10-CM | POA: Diagnosis not present

## 2018-01-08 DIAGNOSIS — F0390 Unspecified dementia without behavioral disturbance: Secondary | ICD-10-CM | POA: Diagnosis not present

## 2018-01-16 DIAGNOSIS — F0390 Unspecified dementia without behavioral disturbance: Secondary | ICD-10-CM | POA: Diagnosis not present

## 2018-02-03 DIAGNOSIS — I10 Essential (primary) hypertension: Secondary | ICD-10-CM | POA: Diagnosis not present

## 2018-02-03 DIAGNOSIS — F0390 Unspecified dementia without behavioral disturbance: Secondary | ICD-10-CM | POA: Diagnosis not present

## 2018-02-03 DIAGNOSIS — G20 Parkinson's disease: Secondary | ICD-10-CM | POA: Diagnosis not present

## 2018-02-05 DIAGNOSIS — F0280 Dementia in other diseases classified elsewhere without behavioral disturbance: Secondary | ICD-10-CM | POA: Diagnosis not present

## 2018-02-05 DIAGNOSIS — I4891 Unspecified atrial fibrillation: Secondary | ICD-10-CM | POA: Diagnosis not present

## 2018-02-05 DIAGNOSIS — R1312 Dysphagia, oropharyngeal phase: Secondary | ICD-10-CM | POA: Diagnosis not present

## 2018-02-05 DIAGNOSIS — E46 Unspecified protein-calorie malnutrition: Secondary | ICD-10-CM | POA: Diagnosis not present

## 2018-02-05 DIAGNOSIS — R471 Dysarthria and anarthria: Secondary | ICD-10-CM | POA: Diagnosis not present

## 2018-02-05 DIAGNOSIS — G3183 Dementia with Lewy bodies: Secondary | ICD-10-CM | POA: Diagnosis not present

## 2018-02-05 DIAGNOSIS — N183 Chronic kidney disease, stage 3 (moderate): Secondary | ICD-10-CM | POA: Diagnosis not present

## 2018-02-05 DIAGNOSIS — I129 Hypertensive chronic kidney disease with stage 1 through stage 4 chronic kidney disease, or unspecified chronic kidney disease: Secondary | ICD-10-CM | POA: Diagnosis not present

## 2018-02-05 DIAGNOSIS — M6281 Muscle weakness (generalized): Secondary | ICD-10-CM | POA: Diagnosis not present

## 2018-02-06 DIAGNOSIS — I4891 Unspecified atrial fibrillation: Secondary | ICD-10-CM | POA: Diagnosis not present

## 2018-02-06 DIAGNOSIS — R1312 Dysphagia, oropharyngeal phase: Secondary | ICD-10-CM | POA: Diagnosis not present

## 2018-02-06 DIAGNOSIS — N183 Chronic kidney disease, stage 3 (moderate): Secondary | ICD-10-CM | POA: Diagnosis not present

## 2018-02-06 DIAGNOSIS — R471 Dysarthria and anarthria: Secondary | ICD-10-CM | POA: Diagnosis not present

## 2018-02-06 DIAGNOSIS — E46 Unspecified protein-calorie malnutrition: Secondary | ICD-10-CM | POA: Diagnosis not present

## 2018-02-06 DIAGNOSIS — G3183 Dementia with Lewy bodies: Secondary | ICD-10-CM | POA: Diagnosis not present

## 2018-02-06 DIAGNOSIS — M6281 Muscle weakness (generalized): Secondary | ICD-10-CM | POA: Diagnosis not present

## 2018-02-06 DIAGNOSIS — I129 Hypertensive chronic kidney disease with stage 1 through stage 4 chronic kidney disease, or unspecified chronic kidney disease: Secondary | ICD-10-CM | POA: Diagnosis not present

## 2018-02-06 DIAGNOSIS — F0280 Dementia in other diseases classified elsewhere without behavioral disturbance: Secondary | ICD-10-CM | POA: Diagnosis not present

## 2018-02-07 DIAGNOSIS — R471 Dysarthria and anarthria: Secondary | ICD-10-CM | POA: Diagnosis not present

## 2018-02-07 DIAGNOSIS — I129 Hypertensive chronic kidney disease with stage 1 through stage 4 chronic kidney disease, or unspecified chronic kidney disease: Secondary | ICD-10-CM | POA: Diagnosis not present

## 2018-02-07 DIAGNOSIS — N183 Chronic kidney disease, stage 3 (moderate): Secondary | ICD-10-CM | POA: Diagnosis not present

## 2018-02-07 DIAGNOSIS — M6281 Muscle weakness (generalized): Secondary | ICD-10-CM | POA: Diagnosis not present

## 2018-02-07 DIAGNOSIS — F0280 Dementia in other diseases classified elsewhere without behavioral disturbance: Secondary | ICD-10-CM | POA: Diagnosis not present

## 2018-02-07 DIAGNOSIS — G3183 Dementia with Lewy bodies: Secondary | ICD-10-CM | POA: Diagnosis not present

## 2018-02-07 DIAGNOSIS — E46 Unspecified protein-calorie malnutrition: Secondary | ICD-10-CM | POA: Diagnosis not present

## 2018-02-07 DIAGNOSIS — I4891 Unspecified atrial fibrillation: Secondary | ICD-10-CM | POA: Diagnosis not present

## 2018-02-07 DIAGNOSIS — R1312 Dysphagia, oropharyngeal phase: Secondary | ICD-10-CM | POA: Diagnosis not present

## 2018-02-10 DIAGNOSIS — R1312 Dysphagia, oropharyngeal phase: Secondary | ICD-10-CM | POA: Diagnosis not present

## 2018-02-10 DIAGNOSIS — F0280 Dementia in other diseases classified elsewhere without behavioral disturbance: Secondary | ICD-10-CM | POA: Diagnosis not present

## 2018-02-10 DIAGNOSIS — I129 Hypertensive chronic kidney disease with stage 1 through stage 4 chronic kidney disease, or unspecified chronic kidney disease: Secondary | ICD-10-CM | POA: Diagnosis not present

## 2018-02-10 DIAGNOSIS — I4891 Unspecified atrial fibrillation: Secondary | ICD-10-CM | POA: Diagnosis not present

## 2018-02-10 DIAGNOSIS — M6281 Muscle weakness (generalized): Secondary | ICD-10-CM | POA: Diagnosis not present

## 2018-02-10 DIAGNOSIS — N183 Chronic kidney disease, stage 3 (moderate): Secondary | ICD-10-CM | POA: Diagnosis not present

## 2018-02-10 DIAGNOSIS — G3183 Dementia with Lewy bodies: Secondary | ICD-10-CM | POA: Diagnosis not present

## 2018-02-10 DIAGNOSIS — E46 Unspecified protein-calorie malnutrition: Secondary | ICD-10-CM | POA: Diagnosis not present

## 2018-02-10 DIAGNOSIS — R471 Dysarthria and anarthria: Secondary | ICD-10-CM | POA: Diagnosis not present

## 2018-02-11 DIAGNOSIS — M6281 Muscle weakness (generalized): Secondary | ICD-10-CM | POA: Diagnosis not present

## 2018-02-11 DIAGNOSIS — R1312 Dysphagia, oropharyngeal phase: Secondary | ICD-10-CM | POA: Diagnosis not present

## 2018-02-11 DIAGNOSIS — G3183 Dementia with Lewy bodies: Secondary | ICD-10-CM | POA: Diagnosis not present

## 2018-02-11 DIAGNOSIS — F0280 Dementia in other diseases classified elsewhere without behavioral disturbance: Secondary | ICD-10-CM | POA: Diagnosis not present

## 2018-02-11 DIAGNOSIS — R471 Dysarthria and anarthria: Secondary | ICD-10-CM | POA: Diagnosis not present

## 2018-02-11 DIAGNOSIS — I129 Hypertensive chronic kidney disease with stage 1 through stage 4 chronic kidney disease, or unspecified chronic kidney disease: Secondary | ICD-10-CM | POA: Diagnosis not present

## 2018-02-11 DIAGNOSIS — N183 Chronic kidney disease, stage 3 (moderate): Secondary | ICD-10-CM | POA: Diagnosis not present

## 2018-02-11 DIAGNOSIS — E46 Unspecified protein-calorie malnutrition: Secondary | ICD-10-CM | POA: Diagnosis not present

## 2018-02-11 DIAGNOSIS — I4891 Unspecified atrial fibrillation: Secondary | ICD-10-CM | POA: Diagnosis not present

## 2018-02-12 DIAGNOSIS — E46 Unspecified protein-calorie malnutrition: Secondary | ICD-10-CM | POA: Diagnosis not present

## 2018-02-12 DIAGNOSIS — I129 Hypertensive chronic kidney disease with stage 1 through stage 4 chronic kidney disease, or unspecified chronic kidney disease: Secondary | ICD-10-CM | POA: Diagnosis not present

## 2018-02-12 DIAGNOSIS — I4891 Unspecified atrial fibrillation: Secondary | ICD-10-CM | POA: Diagnosis not present

## 2018-02-12 DIAGNOSIS — R471 Dysarthria and anarthria: Secondary | ICD-10-CM | POA: Diagnosis not present

## 2018-02-12 DIAGNOSIS — G3183 Dementia with Lewy bodies: Secondary | ICD-10-CM | POA: Diagnosis not present

## 2018-02-12 DIAGNOSIS — R1312 Dysphagia, oropharyngeal phase: Secondary | ICD-10-CM | POA: Diagnosis not present

## 2018-02-12 DIAGNOSIS — N183 Chronic kidney disease, stage 3 (moderate): Secondary | ICD-10-CM | POA: Diagnosis not present

## 2018-02-12 DIAGNOSIS — M6281 Muscle weakness (generalized): Secondary | ICD-10-CM | POA: Diagnosis not present

## 2018-02-12 DIAGNOSIS — F0280 Dementia in other diseases classified elsewhere without behavioral disturbance: Secondary | ICD-10-CM | POA: Diagnosis not present

## 2018-02-13 DIAGNOSIS — N183 Chronic kidney disease, stage 3 (moderate): Secondary | ICD-10-CM | POA: Diagnosis not present

## 2018-02-13 DIAGNOSIS — I4891 Unspecified atrial fibrillation: Secondary | ICD-10-CM | POA: Diagnosis not present

## 2018-02-13 DIAGNOSIS — R1312 Dysphagia, oropharyngeal phase: Secondary | ICD-10-CM | POA: Diagnosis not present

## 2018-02-13 DIAGNOSIS — R471 Dysarthria and anarthria: Secondary | ICD-10-CM | POA: Diagnosis not present

## 2018-02-13 DIAGNOSIS — E46 Unspecified protein-calorie malnutrition: Secondary | ICD-10-CM | POA: Diagnosis not present

## 2018-02-13 DIAGNOSIS — M6281 Muscle weakness (generalized): Secondary | ICD-10-CM | POA: Diagnosis not present

## 2018-02-13 DIAGNOSIS — F0280 Dementia in other diseases classified elsewhere without behavioral disturbance: Secondary | ICD-10-CM | POA: Diagnosis not present

## 2018-02-13 DIAGNOSIS — G3183 Dementia with Lewy bodies: Secondary | ICD-10-CM | POA: Diagnosis not present

## 2018-02-13 DIAGNOSIS — I129 Hypertensive chronic kidney disease with stage 1 through stage 4 chronic kidney disease, or unspecified chronic kidney disease: Secondary | ICD-10-CM | POA: Diagnosis not present

## 2018-02-14 DIAGNOSIS — I4891 Unspecified atrial fibrillation: Secondary | ICD-10-CM | POA: Diagnosis not present

## 2018-02-14 DIAGNOSIS — N183 Chronic kidney disease, stage 3 (moderate): Secondary | ICD-10-CM | POA: Diagnosis not present

## 2018-02-14 DIAGNOSIS — F0280 Dementia in other diseases classified elsewhere without behavioral disturbance: Secondary | ICD-10-CM | POA: Diagnosis not present

## 2018-02-14 DIAGNOSIS — E46 Unspecified protein-calorie malnutrition: Secondary | ICD-10-CM | POA: Diagnosis not present

## 2018-02-14 DIAGNOSIS — R1312 Dysphagia, oropharyngeal phase: Secondary | ICD-10-CM | POA: Diagnosis not present

## 2018-02-14 DIAGNOSIS — R471 Dysarthria and anarthria: Secondary | ICD-10-CM | POA: Diagnosis not present

## 2018-02-14 DIAGNOSIS — M6281 Muscle weakness (generalized): Secondary | ICD-10-CM | POA: Diagnosis not present

## 2018-02-14 DIAGNOSIS — I129 Hypertensive chronic kidney disease with stage 1 through stage 4 chronic kidney disease, or unspecified chronic kidney disease: Secondary | ICD-10-CM | POA: Diagnosis not present

## 2018-02-14 DIAGNOSIS — G3183 Dementia with Lewy bodies: Secondary | ICD-10-CM | POA: Diagnosis not present

## 2018-02-15 DIAGNOSIS — F0390 Unspecified dementia without behavioral disturbance: Secondary | ICD-10-CM | POA: Diagnosis not present

## 2018-02-15 DIAGNOSIS — M129 Arthropathy, unspecified: Secondary | ICD-10-CM | POA: Diagnosis not present

## 2018-02-15 DIAGNOSIS — F05 Delirium due to known physiological condition: Secondary | ICD-10-CM | POA: Diagnosis not present

## 2018-02-15 DIAGNOSIS — G20 Parkinson's disease: Secondary | ICD-10-CM | POA: Diagnosis not present

## 2018-02-15 DIAGNOSIS — M542 Cervicalgia: Secondary | ICD-10-CM | POA: Diagnosis not present

## 2018-02-15 DIAGNOSIS — R296 Repeated falls: Secondary | ICD-10-CM | POA: Diagnosis not present

## 2018-02-15 DIAGNOSIS — R600 Localized edema: Secondary | ICD-10-CM | POA: Diagnosis not present

## 2018-02-15 DIAGNOSIS — F0281 Dementia in other diseases classified elsewhere with behavioral disturbance: Secondary | ICD-10-CM | POA: Diagnosis not present

## 2018-02-15 DIAGNOSIS — J449 Chronic obstructive pulmonary disease, unspecified: Secondary | ICD-10-CM | POA: Diagnosis not present

## 2018-02-15 DIAGNOSIS — L89152 Pressure ulcer of sacral region, stage 2: Secondary | ICD-10-CM | POA: Diagnosis not present

## 2018-02-15 DIAGNOSIS — R2681 Unsteadiness on feet: Secondary | ICD-10-CM | POA: Diagnosis not present

## 2018-02-16 DIAGNOSIS — F0280 Dementia in other diseases classified elsewhere without behavioral disturbance: Secondary | ICD-10-CM | POA: Diagnosis not present

## 2018-02-16 DIAGNOSIS — R471 Dysarthria and anarthria: Secondary | ICD-10-CM | POA: Diagnosis not present

## 2018-02-16 DIAGNOSIS — R1312 Dysphagia, oropharyngeal phase: Secondary | ICD-10-CM | POA: Diagnosis not present

## 2018-02-16 DIAGNOSIS — I4891 Unspecified atrial fibrillation: Secondary | ICD-10-CM | POA: Diagnosis not present

## 2018-02-16 DIAGNOSIS — M6281 Muscle weakness (generalized): Secondary | ICD-10-CM | POA: Diagnosis not present

## 2018-02-16 DIAGNOSIS — E46 Unspecified protein-calorie malnutrition: Secondary | ICD-10-CM | POA: Diagnosis not present

## 2018-02-16 DIAGNOSIS — G3183 Dementia with Lewy bodies: Secondary | ICD-10-CM | POA: Diagnosis not present

## 2018-02-16 DIAGNOSIS — N183 Chronic kidney disease, stage 3 (moderate): Secondary | ICD-10-CM | POA: Diagnosis not present

## 2018-02-16 DIAGNOSIS — I129 Hypertensive chronic kidney disease with stage 1 through stage 4 chronic kidney disease, or unspecified chronic kidney disease: Secondary | ICD-10-CM | POA: Diagnosis not present

## 2018-02-17 DIAGNOSIS — I129 Hypertensive chronic kidney disease with stage 1 through stage 4 chronic kidney disease, or unspecified chronic kidney disease: Secondary | ICD-10-CM | POA: Diagnosis not present

## 2018-02-17 DIAGNOSIS — I4891 Unspecified atrial fibrillation: Secondary | ICD-10-CM | POA: Diagnosis not present

## 2018-02-17 DIAGNOSIS — R1312 Dysphagia, oropharyngeal phase: Secondary | ICD-10-CM | POA: Diagnosis not present

## 2018-02-17 DIAGNOSIS — M6281 Muscle weakness (generalized): Secondary | ICD-10-CM | POA: Diagnosis not present

## 2018-02-17 DIAGNOSIS — G3183 Dementia with Lewy bodies: Secondary | ICD-10-CM | POA: Diagnosis not present

## 2018-02-17 DIAGNOSIS — F0280 Dementia in other diseases classified elsewhere without behavioral disturbance: Secondary | ICD-10-CM | POA: Diagnosis not present

## 2018-02-17 DIAGNOSIS — E46 Unspecified protein-calorie malnutrition: Secondary | ICD-10-CM | POA: Diagnosis not present

## 2018-02-17 DIAGNOSIS — N183 Chronic kidney disease, stage 3 (moderate): Secondary | ICD-10-CM | POA: Diagnosis not present

## 2018-02-17 DIAGNOSIS — R471 Dysarthria and anarthria: Secondary | ICD-10-CM | POA: Diagnosis not present

## 2018-02-18 DIAGNOSIS — I4891 Unspecified atrial fibrillation: Secondary | ICD-10-CM | POA: Diagnosis not present

## 2018-02-18 DIAGNOSIS — F0280 Dementia in other diseases classified elsewhere without behavioral disturbance: Secondary | ICD-10-CM | POA: Diagnosis not present

## 2018-02-18 DIAGNOSIS — N183 Chronic kidney disease, stage 3 (moderate): Secondary | ICD-10-CM | POA: Diagnosis not present

## 2018-02-18 DIAGNOSIS — G3183 Dementia with Lewy bodies: Secondary | ICD-10-CM | POA: Diagnosis not present

## 2018-02-18 DIAGNOSIS — M6281 Muscle weakness (generalized): Secondary | ICD-10-CM | POA: Diagnosis not present

## 2018-02-18 DIAGNOSIS — E46 Unspecified protein-calorie malnutrition: Secondary | ICD-10-CM | POA: Diagnosis not present

## 2018-02-18 DIAGNOSIS — R1312 Dysphagia, oropharyngeal phase: Secondary | ICD-10-CM | POA: Diagnosis not present

## 2018-02-18 DIAGNOSIS — R471 Dysarthria and anarthria: Secondary | ICD-10-CM | POA: Diagnosis not present

## 2018-02-18 DIAGNOSIS — I129 Hypertensive chronic kidney disease with stage 1 through stage 4 chronic kidney disease, or unspecified chronic kidney disease: Secondary | ICD-10-CM | POA: Diagnosis not present

## 2018-02-19 DIAGNOSIS — M6281 Muscle weakness (generalized): Secondary | ICD-10-CM | POA: Diagnosis not present

## 2018-02-19 DIAGNOSIS — E46 Unspecified protein-calorie malnutrition: Secondary | ICD-10-CM | POA: Diagnosis not present

## 2018-02-19 DIAGNOSIS — I129 Hypertensive chronic kidney disease with stage 1 through stage 4 chronic kidney disease, or unspecified chronic kidney disease: Secondary | ICD-10-CM | POA: Diagnosis not present

## 2018-02-19 DIAGNOSIS — G3183 Dementia with Lewy bodies: Secondary | ICD-10-CM | POA: Diagnosis not present

## 2018-02-19 DIAGNOSIS — R1312 Dysphagia, oropharyngeal phase: Secondary | ICD-10-CM | POA: Diagnosis not present

## 2018-02-19 DIAGNOSIS — I4891 Unspecified atrial fibrillation: Secondary | ICD-10-CM | POA: Diagnosis not present

## 2018-02-19 DIAGNOSIS — N183 Chronic kidney disease, stage 3 (moderate): Secondary | ICD-10-CM | POA: Diagnosis not present

## 2018-02-19 DIAGNOSIS — F0280 Dementia in other diseases classified elsewhere without behavioral disturbance: Secondary | ICD-10-CM | POA: Diagnosis not present

## 2018-02-19 DIAGNOSIS — R471 Dysarthria and anarthria: Secondary | ICD-10-CM | POA: Diagnosis not present

## 2018-02-20 DIAGNOSIS — I4891 Unspecified atrial fibrillation: Secondary | ICD-10-CM | POA: Diagnosis not present

## 2018-02-20 DIAGNOSIS — N183 Chronic kidney disease, stage 3 (moderate): Secondary | ICD-10-CM | POA: Diagnosis not present

## 2018-02-20 DIAGNOSIS — R471 Dysarthria and anarthria: Secondary | ICD-10-CM | POA: Diagnosis not present

## 2018-02-20 DIAGNOSIS — R1312 Dysphagia, oropharyngeal phase: Secondary | ICD-10-CM | POA: Diagnosis not present

## 2018-02-20 DIAGNOSIS — M6281 Muscle weakness (generalized): Secondary | ICD-10-CM | POA: Diagnosis not present

## 2018-02-20 DIAGNOSIS — I129 Hypertensive chronic kidney disease with stage 1 through stage 4 chronic kidney disease, or unspecified chronic kidney disease: Secondary | ICD-10-CM | POA: Diagnosis not present

## 2018-02-20 DIAGNOSIS — E46 Unspecified protein-calorie malnutrition: Secondary | ICD-10-CM | POA: Diagnosis not present

## 2018-02-20 DIAGNOSIS — F0280 Dementia in other diseases classified elsewhere without behavioral disturbance: Secondary | ICD-10-CM | POA: Diagnosis not present

## 2018-02-20 DIAGNOSIS — G3183 Dementia with Lewy bodies: Secondary | ICD-10-CM | POA: Diagnosis not present

## 2018-02-21 DIAGNOSIS — N183 Chronic kidney disease, stage 3 (moderate): Secondary | ICD-10-CM | POA: Diagnosis not present

## 2018-02-21 DIAGNOSIS — R471 Dysarthria and anarthria: Secondary | ICD-10-CM | POA: Diagnosis not present

## 2018-02-21 DIAGNOSIS — M6281 Muscle weakness (generalized): Secondary | ICD-10-CM | POA: Diagnosis not present

## 2018-02-21 DIAGNOSIS — R1312 Dysphagia, oropharyngeal phase: Secondary | ICD-10-CM | POA: Diagnosis not present

## 2018-02-21 DIAGNOSIS — F0280 Dementia in other diseases classified elsewhere without behavioral disturbance: Secondary | ICD-10-CM | POA: Diagnosis not present

## 2018-02-21 DIAGNOSIS — E46 Unspecified protein-calorie malnutrition: Secondary | ICD-10-CM | POA: Diagnosis not present

## 2018-02-21 DIAGNOSIS — I4891 Unspecified atrial fibrillation: Secondary | ICD-10-CM | POA: Diagnosis not present

## 2018-02-21 DIAGNOSIS — I129 Hypertensive chronic kidney disease with stage 1 through stage 4 chronic kidney disease, or unspecified chronic kidney disease: Secondary | ICD-10-CM | POA: Diagnosis not present

## 2018-02-21 DIAGNOSIS — G3183 Dementia with Lewy bodies: Secondary | ICD-10-CM | POA: Diagnosis not present

## 2018-02-24 DIAGNOSIS — R471 Dysarthria and anarthria: Secondary | ICD-10-CM | POA: Diagnosis not present

## 2018-02-24 DIAGNOSIS — R1312 Dysphagia, oropharyngeal phase: Secondary | ICD-10-CM | POA: Diagnosis not present

## 2018-02-24 DIAGNOSIS — I129 Hypertensive chronic kidney disease with stage 1 through stage 4 chronic kidney disease, or unspecified chronic kidney disease: Secondary | ICD-10-CM | POA: Diagnosis not present

## 2018-02-24 DIAGNOSIS — E46 Unspecified protein-calorie malnutrition: Secondary | ICD-10-CM | POA: Diagnosis not present

## 2018-02-24 DIAGNOSIS — G3183 Dementia with Lewy bodies: Secondary | ICD-10-CM | POA: Diagnosis not present

## 2018-02-24 DIAGNOSIS — F0280 Dementia in other diseases classified elsewhere without behavioral disturbance: Secondary | ICD-10-CM | POA: Diagnosis not present

## 2018-02-24 DIAGNOSIS — M6281 Muscle weakness (generalized): Secondary | ICD-10-CM | POA: Diagnosis not present

## 2018-02-24 DIAGNOSIS — I4891 Unspecified atrial fibrillation: Secondary | ICD-10-CM | POA: Diagnosis not present

## 2018-02-24 DIAGNOSIS — N183 Chronic kidney disease, stage 3 (moderate): Secondary | ICD-10-CM | POA: Diagnosis not present

## 2018-02-25 DIAGNOSIS — R471 Dysarthria and anarthria: Secondary | ICD-10-CM | POA: Diagnosis not present

## 2018-02-25 DIAGNOSIS — R1312 Dysphagia, oropharyngeal phase: Secondary | ICD-10-CM | POA: Diagnosis not present

## 2018-02-25 DIAGNOSIS — N183 Chronic kidney disease, stage 3 (moderate): Secondary | ICD-10-CM | POA: Diagnosis not present

## 2018-02-25 DIAGNOSIS — M6281 Muscle weakness (generalized): Secondary | ICD-10-CM | POA: Diagnosis not present

## 2018-02-25 DIAGNOSIS — I129 Hypertensive chronic kidney disease with stage 1 through stage 4 chronic kidney disease, or unspecified chronic kidney disease: Secondary | ICD-10-CM | POA: Diagnosis not present

## 2018-02-25 DIAGNOSIS — E46 Unspecified protein-calorie malnutrition: Secondary | ICD-10-CM | POA: Diagnosis not present

## 2018-02-25 DIAGNOSIS — F0280 Dementia in other diseases classified elsewhere without behavioral disturbance: Secondary | ICD-10-CM | POA: Diagnosis not present

## 2018-02-25 DIAGNOSIS — G3183 Dementia with Lewy bodies: Secondary | ICD-10-CM | POA: Diagnosis not present

## 2018-02-25 DIAGNOSIS — I4891 Unspecified atrial fibrillation: Secondary | ICD-10-CM | POA: Diagnosis not present

## 2018-02-26 ENCOUNTER — Encounter: Payer: Self-pay | Admitting: Emergency Medicine

## 2018-02-26 ENCOUNTER — Emergency Department: Payer: Medicare HMO

## 2018-02-26 ENCOUNTER — Emergency Department
Admission: EM | Admit: 2018-02-26 | Discharge: 2018-02-26 | Disposition: A | Discharge status: Home / Self Care | Payer: Medicare HMO | Attending: Emergency Medicine | Admitting: Emergency Medicine

## 2018-02-26 ENCOUNTER — Other Ambulatory Visit: Payer: Self-pay

## 2018-02-26 DIAGNOSIS — Z87891 Personal history of nicotine dependence: Secondary | ICD-10-CM | POA: Insufficient documentation

## 2018-02-26 DIAGNOSIS — G20 Parkinson's disease: Secondary | ICD-10-CM | POA: Insufficient documentation

## 2018-02-26 DIAGNOSIS — F0280 Dementia in other diseases classified elsewhere without behavioral disturbance: Secondary | ICD-10-CM | POA: Diagnosis not present

## 2018-02-26 DIAGNOSIS — Z7902 Long term (current) use of antithrombotics/antiplatelets: Secondary | ICD-10-CM | POA: Insufficient documentation

## 2018-02-26 DIAGNOSIS — Z79899 Other long term (current) drug therapy: Secondary | ICD-10-CM | POA: Diagnosis not present

## 2018-02-26 DIAGNOSIS — R1312 Dysphagia, oropharyngeal phase: Secondary | ICD-10-CM | POA: Diagnosis not present

## 2018-02-26 DIAGNOSIS — M6281 Muscle weakness (generalized): Secondary | ICD-10-CM | POA: Diagnosis not present

## 2018-02-26 DIAGNOSIS — M71321 Other bursal cyst, right elbow: Secondary | ICD-10-CM | POA: Diagnosis not present

## 2018-02-26 DIAGNOSIS — I129 Hypertensive chronic kidney disease with stage 1 through stage 4 chronic kidney disease, or unspecified chronic kidney disease: Secondary | ICD-10-CM | POA: Diagnosis not present

## 2018-02-26 DIAGNOSIS — G3183 Dementia with Lewy bodies: Secondary | ICD-10-CM | POA: Diagnosis not present

## 2018-02-26 DIAGNOSIS — C44602 Unspecified malignant neoplasm of skin of right upper limb, including shoulder: Secondary | ICD-10-CM | POA: Diagnosis not present

## 2018-02-26 DIAGNOSIS — I10 Essential (primary) hypertension: Secondary | ICD-10-CM | POA: Diagnosis not present

## 2018-02-26 DIAGNOSIS — R471 Dysarthria and anarthria: Secondary | ICD-10-CM | POA: Diagnosis not present

## 2018-02-26 DIAGNOSIS — I2510 Atherosclerotic heart disease of native coronary artery without angina pectoris: Secondary | ICD-10-CM | POA: Insufficient documentation

## 2018-02-26 DIAGNOSIS — E46 Unspecified protein-calorie malnutrition: Secondary | ICD-10-CM | POA: Diagnosis not present

## 2018-02-26 DIAGNOSIS — I4891 Unspecified atrial fibrillation: Secondary | ICD-10-CM | POA: Diagnosis not present

## 2018-02-26 DIAGNOSIS — J449 Chronic obstructive pulmonary disease, unspecified: Secondary | ICD-10-CM | POA: Diagnosis not present

## 2018-02-26 DIAGNOSIS — Z85828 Personal history of other malignant neoplasm of skin: Secondary | ICD-10-CM | POA: Diagnosis not present

## 2018-02-26 DIAGNOSIS — N183 Chronic kidney disease, stage 3 (moderate): Secondary | ICD-10-CM | POA: Diagnosis not present

## 2018-02-26 DIAGNOSIS — R05 Cough: Secondary | ICD-10-CM | POA: Diagnosis not present

## 2018-02-26 DIAGNOSIS — M25821 Other specified joint disorders, right elbow: Secondary | ICD-10-CM | POA: Diagnosis not present

## 2018-02-26 MED ORDER — SULFAMETHOXAZOLE-TRIMETHOPRIM 800-160 MG PO TABS: 1.0000 | ORAL_TABLET | Freq: Two times a day (BID) | ORAL | 0 refills | Status: DC

## 2018-02-26 MED ORDER — LIDOCAINE HCL (PF) 1 % IJ SOLN
INTRAMUSCULAR | Status: AC
  Filled 2018-02-26: qty 5

## 2018-02-26 MED ORDER — LIDOCAINE HCL (PF) 1 % IJ SOLN
5.0000 mL | Freq: Once | INTRAMUSCULAR | Status: AC
  Administered 2018-02-26: 14:00:00

## 2018-02-26 NOTE — ED Provider Notes (Signed)
[facility] Emergency Department Provider Note   ____________________________________________   First MD Initiated Contact with Patient 02/26/18 1220     (approximate)  I have reviewed the triage vital signs and the nursing notes.   HISTORY  Chief Complaint Cyst    HPI Terry Taylor is a 84 y.o. male patient presents with extrusion of a cystic mass from the right olecranon process.  Care care to take his state lesion for 2 years.  Patienthit the lesion on a chair and presents with bleeding with extrusion of the lesion through the skin.  Patient denies pain.   Past Medical History:  Diagnosis Date  . Anxiety   . Arthritis    "bad in his knees" (01/09/2017)  . Benign prostatic hypertrophy    hx  . Chronic airway obstruction, not elsewhere classified   . Chronic bronchitis ([place])   . Coronary atherosclerosis of unspecified type of vessel, native or graft   . GERD (gastroesophageal reflux disease)   . New onset atrial fibrillation ([place]) 01/08/2017   [name] 01/08/2017  . On home oxygen therapy    "prn" (01/09/2017)  . Osteoarthrosis, unspecified whether generalized or localized, unspecified site   . Parkinson's disease ([place])    with dementia and psychosis.   . Pneumonia    "more than once" (01/09/2017)  . Pure hypercholesterolemia    IIA  . Skin cancer of face   . Stroke [facility])    "they think he had a minor stroke 1-2 yr ago" (01/09/2017)  . Unspecified essential hypertension     Patient Active Problem List   Diagnosis Date Noted  . Goals of care, counseling/discussion   . Palliative care encounter   . Pressure injury of skin 10/09/2017  . Polypharmacy   . Supplemental oxygen dependent   . PAF (paroxysmal atrial fibrillation) ([place])   . Benign essential HTN   . Parkinson disease ([place])   . Hypoalbuminemia due to protein-calorie malnutrition ([place])   . Overdose 10/04/2017  . Altered mental status 10/04/2017  . UTI (urinary tract infection)  10/04/2017  . Anemia 10/04/2017  . Renal insufficiency 10/04/2017  . Paroxysmal A-fib ([place]) 01/11/2017  . Influenza A 01/09/2017  . COPD (chronic obstructive pulmonary disease) ([place]) 01/08/2017  . Dementia 09/13/2016  . Chest pain, rule out acute myocardial infarction 09/12/2016  . Fever 03/07/2013  . Acute encephalopathy 03/07/2013  . Weakness generalized 03/07/2013  . HYPERCHOLESTEROLEMIA  IIA 04/09/2009  . Essential hypertension 04/09/2009  . Coronary artery disease due to lipid rich plaque 04/09/2009  . COPD exacerbation ([place]) 04/09/2009  . OSTEOARTHRITIS 04/09/2009  . BENIGN PROSTATIC HYPERTROPHY, HX OF 04/09/2009    Past Surgical History:  Procedure Laterality Date  . CERVICAL DISCECTOMY    . FRACTURE SURGERY    . SKIN CANCER DESTRUCTION Right    "face"  . TIBIA FRACTURE SURGERY Right    "he's got a pin in there"    Prior to Admission medications   Medication Sig Start Date End Date Taking? Authorizing Provider  amoxicillin (AMOXIL) 500 MG capsule Take 1 capsule (500 mg total) by mouth every 8 (eight) hours. X 1 days 10/12/17   Rai, Ripudeep K, MD  carbidopa-levodopa (SINEMET IR) 25-100 MG tablet Take 2 tablets by mouth 3 (three) times daily. 10/12/17   Rai, Vernelle Emerald, MD  clonazePAM (KLONOPIN) 0.5 MG tablet Take 1 tablet (0.5 mg total) by mouth at bedtime. 10/12/17   Rai, Vernelle Emerald, MD  feeding supplement, ENSURE ENLIVE, (ENSURE ENLIVE)  LIQD Take 237 mLs by mouth 2 (two) times daily between meals. 10/12/17   Rai, Ripudeep Raliegh Ip, MD  finasteride (PROSCAR) 5 MG tablet Take 1 tablet (5 mg total) by mouth daily. 09/14/16   Ghimire, Henreitta Leber, MD  isosorbide mononitrate (IMDUR) 30 MG 24 hr tablet Take 0.5 tablets (15 mg total) by mouth daily. 09/13/16   Ghimire, Henreitta Leber, MD  losartan (COZAAR) 50 MG tablet Take 1 tablet (50 mg total) by mouth daily. 03/10/13   Bynum Bellows, MD  naphazoline-glycerin (CLEAR EYES) 0.012-0.2 % SOLN Place 1-2 drops into both eyes 4 (four) times  daily as needed for eye irritation. 10/09/17   Rai, Ripudeep K, MD  pravastatin (PRAVACHOL) 40 MG tablet Take 40 mg by mouth daily.    [provider]  QUEtiapine (SEROQUEL) 25 MG tablet Take 1 tablet (25 mg total) by mouth at bedtime. 10/12/17   Rai, Vernelle Emerald, MD  sulfamethoxazole-trimethoprim (BACTRIM DS,SEPTRA DS) 800-160 MG tablet Take 1 tablet by mouth 2 (two) times daily. 02/26/18   Sable Feil, PA-C  tamsulosin (FLOMAX) 0.4 MG CAPS Take 0.4 mg by mouth daily.    [provider]    Allergies Bee venom  Family History  Problem Relation Age of Onset  . Coronary artery disease Unknown        family hx  . Stroke Mother   . Stroke Father     Social History Social History   Tobacco Use  . Smoking status: Former Smoker    Years: 40.00    Types: Cigarettes    Last attempt to quit: 1979    Years since quitting: 40.1  . Smokeless tobacco: Never Used  Substance Use Topics  . Alcohol use: Yes    Comment: 01/09/2017 "nothing in the past several years"  . Drug use: No    Review of Systems Constitutional: No fever/chills Eyes: No visual changes. ENT: No sore throat. Cardiovascular: Denies chest pain. Respiratory: Denies shortness of breath. Gastrointestinal: No abdominal pain.  No nausea, no vomiting.  No diarrhea.  No constipation. Genitourinary: Negative for dysuria. Musculoskeletal: Negative for back pain. Skin: Negative for rash. Neurological: Negative for headaches, focal weakness or numbness.  Parkinson Psychiatric:Anxiety Endocrine:Hyperlipidemia and hypertension. Allergic/Immunilogical: Bee sting ____________________________________________   PHYSICAL EXAM:  VITAL SIGNS: ED Triage Vitals  Enc Vitals Group     BP 02/26/18 1201 118/66     Pulse Rate 02/26/18 1201 68     Resp 02/26/18 1201 16     Temp 02/26/18 1201 98.5 F (36.9 C)     Temp Source 02/26/18 1201 Oral     SpO2 02/26/18 1201 96 %     Weight 02/26/18 1202 160 lb (72.6 kg)      Height 02/26/18 1202 5\' 9"  (1.753 m)     Head Circumference --      Peak Flow --      Pain Score --      Pain Loc --      Pain Edu? --      Excl. in [place]? --    Constitutional: Alert and oriented. Well appearing and in no acute distress. Respiratory: Normal respiratory effort.  No retractions. Lungs with bilateral rales and cough. Gastrointestinal: Soft and nontender. No distention. No abdominal bruits. No CVA tenderness. Musculoskeletal: No lower extremity tenderness nor edema.  No joint effusions. Neurologic:  Normal speech and language. No gross focal neurologic deficits are appreciated. No gait instability. Skin: Nodule lesion posterior elbow.   Psychiatric: Mood  and affect are normal. Speech and behavior are normal.  ____________________________________________   LABS (all labs ordered are listed, but only abnormal results are displayed)  Labs Reviewed  SURGICAL PATHOLOGY   ____________________________________________  EKG   ____________________________________________  RADIOLOGY  ED MD interpretation: No acute findings on chest x-ray Official radiology report(s): Dg Chest Portable 1 View  Result Date: 02/26/2018 CLINICAL DATA:  Several days of productive cough, 1 week of intermittent low-grade fever. Generalized weakness. History of coronary artery disease, COPD, former smoker. EXAM: PORTABLE CHEST 1 VIEW COMPARISON:  Chest x-ray of October 10, 2017 FINDINGS: The lungs are mildly hypoinflated but clear. The heart is top-normal in size. The pulmonary vascularity is normal. There is tortuosity of the ascending and descending thoracic aorta. The trachea is midline. The bony thorax exhibits no acute abnormality. There are degenerative changes of both shoulders. IMPRESSION: Mild hypoinflation.  No acute pneumonia nor CHF. Electronically Signed   By: David  Martinique M.D.   On: 02/26/2018 13:21    ____________________________________________   PROCEDURES  Procedure(s)  performed: None  Biopsy Date/Time: 02/26/2018 2:11 PM Performed by: Sable Feil, PA-C Authorized by: Sable Feil, PA-C  Consent: The procedure was performed in an emergent situation. Verbal consent obtained. Written consent not obtained. Consent given by: guardian and patient Patient understanding: patient states understanding of the procedure being performed Patient identity confirmed: arm band Preparation: Patient was prepped and draped in the usual sterile fashion. Local anesthesia used: yes Anesthesia: local infiltration  Anesthesia: Local anesthesia used: yes Anesthetic total: 8 mL  Sedation: Patient sedated: no  Patient tolerance: Patient tolerated the procedure well with no immediate complications     Critical Care performed: No  ____________________________________________   INITIAL IMPRESSION / ASSESSMENT AND PLAN / ED COURSE  As part of my medical decision making, I reviewed the following data within the electronic MEDICAL RECORD NUMBER    Calcified bursal cyst protruding from elbow secondary to blunt trauma.  Using blunt dissection entire lesion was removed sent to the lab.  Patient had a small arterial hemorrhaging which was cauterized.  Area was copiously irrigated and the wound closed with 4-0 Prolene.  Patient given a prescription for Bactrim DS and advised to have sutures removed in 10 days by this department or PCP.  Return back if condition worsen before healing is complete.      ____________________________________________   FINAL CLINICAL IMPRESSION(S) / ED DIAGNOSES  Final diagnoses:  Other bursal cyst, right elbow     ED Discharge Orders        Ordered    sulfamethoxazole-trimethoprim (BACTRIM DS,SEPTRA DS) 800-160 MG tablet  2 times daily     02/26/18 1407       Note:  This document was prepared using Dragon voice recognition software and may include unintentional dictation errors.    Sable Feil, PA-C 02/26/18 1415      Schaevitz, Randall An, MD 02/26/18 [phone]

## 2018-02-26 NOTE — ED Notes (Signed)
Sterile dressing applied to right arm

## 2018-02-26 NOTE — ED Triage Notes (Signed)
Here today because caught cyst on chair that has been present on arm X 2 years. Scant blood. They would like it cut off.

## 2018-02-27 DIAGNOSIS — E46 Unspecified protein-calorie malnutrition: Secondary | ICD-10-CM | POA: Diagnosis not present

## 2018-02-27 DIAGNOSIS — I129 Hypertensive chronic kidney disease with stage 1 through stage 4 chronic kidney disease, or unspecified chronic kidney disease: Secondary | ICD-10-CM | POA: Diagnosis not present

## 2018-02-27 DIAGNOSIS — F0280 Dementia in other diseases classified elsewhere without behavioral disturbance: Secondary | ICD-10-CM | POA: Diagnosis not present

## 2018-02-27 DIAGNOSIS — G3183 Dementia with Lewy bodies: Secondary | ICD-10-CM | POA: Diagnosis not present

## 2018-02-27 DIAGNOSIS — R471 Dysarthria and anarthria: Secondary | ICD-10-CM | POA: Diagnosis not present

## 2018-02-27 DIAGNOSIS — N183 Chronic kidney disease, stage 3 (moderate): Secondary | ICD-10-CM | POA: Diagnosis not present

## 2018-02-27 DIAGNOSIS — I4891 Unspecified atrial fibrillation: Secondary | ICD-10-CM | POA: Diagnosis not present

## 2018-02-27 DIAGNOSIS — R1312 Dysphagia, oropharyngeal phase: Secondary | ICD-10-CM | POA: Diagnosis not present

## 2018-02-27 DIAGNOSIS — M6281 Muscle weakness (generalized): Secondary | ICD-10-CM | POA: Diagnosis not present

## 2018-02-28 DIAGNOSIS — N183 Chronic kidney disease, stage 3 (moderate): Secondary | ICD-10-CM | POA: Diagnosis not present

## 2018-02-28 DIAGNOSIS — E46 Unspecified protein-calorie malnutrition: Secondary | ICD-10-CM | POA: Diagnosis not present

## 2018-02-28 DIAGNOSIS — F0280 Dementia in other diseases classified elsewhere without behavioral disturbance: Secondary | ICD-10-CM | POA: Diagnosis not present

## 2018-02-28 DIAGNOSIS — R1312 Dysphagia, oropharyngeal phase: Secondary | ICD-10-CM | POA: Diagnosis not present

## 2018-02-28 DIAGNOSIS — M6281 Muscle weakness (generalized): Secondary | ICD-10-CM | POA: Diagnosis not present

## 2018-02-28 DIAGNOSIS — R471 Dysarthria and anarthria: Secondary | ICD-10-CM | POA: Diagnosis not present

## 2018-02-28 DIAGNOSIS — I4891 Unspecified atrial fibrillation: Secondary | ICD-10-CM | POA: Diagnosis not present

## 2018-02-28 DIAGNOSIS — I129 Hypertensive chronic kidney disease with stage 1 through stage 4 chronic kidney disease, or unspecified chronic kidney disease: Secondary | ICD-10-CM | POA: Diagnosis not present

## 2018-02-28 DIAGNOSIS — G3183 Dementia with Lewy bodies: Secondary | ICD-10-CM | POA: Diagnosis not present

## 2018-03-03 ENCOUNTER — Telehealth: Payer: Self-pay | Admitting: *Deleted

## 2018-03-03 ENCOUNTER — Inpatient Hospital Stay: Payer: Medicare HMO | Admitting: Oncology

## 2018-03-03 DIAGNOSIS — F0280 Dementia in other diseases classified elsewhere without behavioral disturbance: Secondary | ICD-10-CM | POA: Diagnosis not present

## 2018-03-03 DIAGNOSIS — G3183 Dementia with Lewy bodies: Secondary | ICD-10-CM | POA: Diagnosis not present

## 2018-03-03 DIAGNOSIS — R1312 Dysphagia, oropharyngeal phase: Secondary | ICD-10-CM | POA: Diagnosis not present

## 2018-03-03 DIAGNOSIS — R471 Dysarthria and anarthria: Secondary | ICD-10-CM | POA: Diagnosis not present

## 2018-03-03 DIAGNOSIS — I129 Hypertensive chronic kidney disease with stage 1 through stage 4 chronic kidney disease, or unspecified chronic kidney disease: Secondary | ICD-10-CM | POA: Diagnosis not present

## 2018-03-03 DIAGNOSIS — E46 Unspecified protein-calorie malnutrition: Secondary | ICD-10-CM | POA: Diagnosis not present

## 2018-03-03 DIAGNOSIS — N183 Chronic kidney disease, stage 3 (moderate): Secondary | ICD-10-CM | POA: Diagnosis not present

## 2018-03-03 DIAGNOSIS — I4891 Unspecified atrial fibrillation: Secondary | ICD-10-CM | POA: Diagnosis not present

## 2018-03-03 DIAGNOSIS — M6281 Muscle weakness (generalized): Secondary | ICD-10-CM | POA: Diagnosis not present

## 2018-03-03 NOTE — Telephone Encounter (Signed)
Called son and let him know that we called and checked with pathology dept. They do not have any pathology results yet.  The specimen was sent out for [facility] testing. We do not have a preliminary. Will need to change appt. Rec; next Monday but son says that is not a good date for him. Agreed on 3/14 at 10:30 and we can keep looking at pathology and if it comes back early we can call and work something else out. Son agreeable to plan

## 2018-03-04 LAB — SURGICAL PATHOLOGY

## 2018-03-05 DIAGNOSIS — G3183 Dementia with Lewy bodies: Secondary | ICD-10-CM | POA: Diagnosis not present

## 2018-03-05 DIAGNOSIS — E46 Unspecified protein-calorie malnutrition: Secondary | ICD-10-CM | POA: Diagnosis not present

## 2018-03-05 DIAGNOSIS — I4891 Unspecified atrial fibrillation: Secondary | ICD-10-CM | POA: Diagnosis not present

## 2018-03-05 DIAGNOSIS — N183 Chronic kidney disease, stage 3 (moderate): Secondary | ICD-10-CM | POA: Diagnosis not present

## 2018-03-05 DIAGNOSIS — I129 Hypertensive chronic kidney disease with stage 1 through stage 4 chronic kidney disease, or unspecified chronic kidney disease: Secondary | ICD-10-CM | POA: Diagnosis not present

## 2018-03-05 DIAGNOSIS — M6281 Muscle weakness (generalized): Secondary | ICD-10-CM | POA: Diagnosis not present

## 2018-03-05 DIAGNOSIS — R471 Dysarthria and anarthria: Secondary | ICD-10-CM | POA: Diagnosis not present

## 2018-03-05 DIAGNOSIS — F0280 Dementia in other diseases classified elsewhere without behavioral disturbance: Secondary | ICD-10-CM | POA: Diagnosis not present

## 2018-03-05 DIAGNOSIS — R1312 Dysphagia, oropharyngeal phase: Secondary | ICD-10-CM | POA: Diagnosis not present

## 2018-03-06 DIAGNOSIS — N183 Chronic kidney disease, stage 3 (moderate): Secondary | ICD-10-CM | POA: Diagnosis not present

## 2018-03-06 DIAGNOSIS — F0280 Dementia in other diseases classified elsewhere without behavioral disturbance: Secondary | ICD-10-CM | POA: Diagnosis not present

## 2018-03-06 DIAGNOSIS — I129 Hypertensive chronic kidney disease with stage 1 through stage 4 chronic kidney disease, or unspecified chronic kidney disease: Secondary | ICD-10-CM | POA: Diagnosis not present

## 2018-03-06 DIAGNOSIS — R1312 Dysphagia, oropharyngeal phase: Secondary | ICD-10-CM | POA: Diagnosis not present

## 2018-03-06 DIAGNOSIS — M6281 Muscle weakness (generalized): Secondary | ICD-10-CM | POA: Diagnosis not present

## 2018-03-06 DIAGNOSIS — E46 Unspecified protein-calorie malnutrition: Secondary | ICD-10-CM | POA: Diagnosis not present

## 2018-03-06 DIAGNOSIS — I4891 Unspecified atrial fibrillation: Secondary | ICD-10-CM | POA: Diagnosis not present

## 2018-03-06 DIAGNOSIS — R471 Dysarthria and anarthria: Secondary | ICD-10-CM | POA: Diagnosis not present

## 2018-03-06 DIAGNOSIS — G3183 Dementia with Lewy bodies: Secondary | ICD-10-CM | POA: Diagnosis not present

## 2018-03-07 DIAGNOSIS — N183 Chronic kidney disease, stage 3 (moderate): Secondary | ICD-10-CM | POA: Diagnosis not present

## 2018-03-07 DIAGNOSIS — G3183 Dementia with Lewy bodies: Secondary | ICD-10-CM | POA: Diagnosis not present

## 2018-03-07 DIAGNOSIS — R1312 Dysphagia, oropharyngeal phase: Secondary | ICD-10-CM | POA: Diagnosis not present

## 2018-03-07 DIAGNOSIS — R471 Dysarthria and anarthria: Secondary | ICD-10-CM | POA: Diagnosis not present

## 2018-03-07 DIAGNOSIS — F0280 Dementia in other diseases classified elsewhere without behavioral disturbance: Secondary | ICD-10-CM | POA: Diagnosis not present

## 2018-03-07 DIAGNOSIS — I129 Hypertensive chronic kidney disease with stage 1 through stage 4 chronic kidney disease, or unspecified chronic kidney disease: Secondary | ICD-10-CM | POA: Diagnosis not present

## 2018-03-07 DIAGNOSIS — M6281 Muscle weakness (generalized): Secondary | ICD-10-CM | POA: Diagnosis not present

## 2018-03-07 DIAGNOSIS — I4891 Unspecified atrial fibrillation: Secondary | ICD-10-CM | POA: Diagnosis not present

## 2018-03-07 DIAGNOSIS — E46 Unspecified protein-calorie malnutrition: Secondary | ICD-10-CM | POA: Diagnosis not present

## 2018-03-10 DIAGNOSIS — G3183 Dementia with Lewy bodies: Secondary | ICD-10-CM | POA: Diagnosis not present

## 2018-03-10 DIAGNOSIS — M6281 Muscle weakness (generalized): Secondary | ICD-10-CM | POA: Diagnosis not present

## 2018-03-10 DIAGNOSIS — R471 Dysarthria and anarthria: Secondary | ICD-10-CM | POA: Diagnosis not present

## 2018-03-10 DIAGNOSIS — I4891 Unspecified atrial fibrillation: Secondary | ICD-10-CM | POA: Diagnosis not present

## 2018-03-10 DIAGNOSIS — N183 Chronic kidney disease, stage 3 (moderate): Secondary | ICD-10-CM | POA: Diagnosis not present

## 2018-03-10 DIAGNOSIS — F0280 Dementia in other diseases classified elsewhere without behavioral disturbance: Secondary | ICD-10-CM | POA: Diagnosis not present

## 2018-03-10 DIAGNOSIS — E46 Unspecified protein-calorie malnutrition: Secondary | ICD-10-CM | POA: Diagnosis not present

## 2018-03-10 DIAGNOSIS — R1312 Dysphagia, oropharyngeal phase: Secondary | ICD-10-CM | POA: Diagnosis not present

## 2018-03-10 DIAGNOSIS — I129 Hypertensive chronic kidney disease with stage 1 through stage 4 chronic kidney disease, or unspecified chronic kidney disease: Secondary | ICD-10-CM | POA: Diagnosis not present

## 2018-03-11 DIAGNOSIS — F0280 Dementia in other diseases classified elsewhere without behavioral disturbance: Secondary | ICD-10-CM | POA: Diagnosis not present

## 2018-03-11 DIAGNOSIS — R1312 Dysphagia, oropharyngeal phase: Secondary | ICD-10-CM | POA: Diagnosis not present

## 2018-03-11 DIAGNOSIS — I4891 Unspecified atrial fibrillation: Secondary | ICD-10-CM | POA: Diagnosis not present

## 2018-03-11 DIAGNOSIS — E46 Unspecified protein-calorie malnutrition: Secondary | ICD-10-CM | POA: Diagnosis not present

## 2018-03-11 DIAGNOSIS — N183 Chronic kidney disease, stage 3 (moderate): Secondary | ICD-10-CM | POA: Diagnosis not present

## 2018-03-11 DIAGNOSIS — I129 Hypertensive chronic kidney disease with stage 1 through stage 4 chronic kidney disease, or unspecified chronic kidney disease: Secondary | ICD-10-CM | POA: Diagnosis not present

## 2018-03-11 DIAGNOSIS — M6281 Muscle weakness (generalized): Secondary | ICD-10-CM | POA: Diagnosis not present

## 2018-03-11 DIAGNOSIS — R471 Dysarthria and anarthria: Secondary | ICD-10-CM | POA: Diagnosis not present

## 2018-03-11 DIAGNOSIS — G3183 Dementia with Lewy bodies: Secondary | ICD-10-CM | POA: Diagnosis not present

## 2018-03-12 DIAGNOSIS — Z1329 Encounter for screening for other suspected endocrine disorder: Secondary | ICD-10-CM | POA: Diagnosis not present

## 2018-03-12 DIAGNOSIS — I10 Essential (primary) hypertension: Secondary | ICD-10-CM | POA: Diagnosis not present

## 2018-03-12 DIAGNOSIS — Z131 Encounter for screening for diabetes mellitus: Secondary | ICD-10-CM | POA: Diagnosis not present

## 2018-03-12 DIAGNOSIS — J449 Chronic obstructive pulmonary disease, unspecified: Secondary | ICD-10-CM | POA: Diagnosis not present

## 2018-03-12 DIAGNOSIS — K5901 Slow transit constipation: Secondary | ICD-10-CM | POA: Diagnosis not present

## 2018-03-12 DIAGNOSIS — E7800 Pure hypercholesterolemia, unspecified: Secondary | ICD-10-CM | POA: Diagnosis not present

## 2018-03-13 ENCOUNTER — Other Ambulatory Visit: Payer: Self-pay

## 2018-03-13 ENCOUNTER — Encounter: Payer: Self-pay | Admitting: Oncology

## 2018-03-13 ENCOUNTER — Inpatient Hospital Stay: Payer: Medicare HMO | Attending: Oncology | Admitting: Oncology

## 2018-03-13 VITALS — BP 105/70 | HR 76 | Temp 97.0°F | Resp 18 | Wt 148.5 lb

## 2018-03-13 DIAGNOSIS — I480 Paroxysmal atrial fibrillation: Secondary | ICD-10-CM | POA: Insufficient documentation

## 2018-03-13 DIAGNOSIS — I129 Hypertensive chronic kidney disease with stage 1 through stage 4 chronic kidney disease, or unspecified chronic kidney disease: Secondary | ICD-10-CM | POA: Diagnosis not present

## 2018-03-13 DIAGNOSIS — I4891 Unspecified atrial fibrillation: Secondary | ICD-10-CM | POA: Diagnosis not present

## 2018-03-13 DIAGNOSIS — D0461 Carcinoma in situ of skin of right upper limb, including shoulder: Secondary | ICD-10-CM | POA: Insufficient documentation

## 2018-03-13 DIAGNOSIS — R471 Dysarthria and anarthria: Secondary | ICD-10-CM | POA: Diagnosis not present

## 2018-03-13 DIAGNOSIS — G3183 Dementia with Lewy bodies: Secondary | ICD-10-CM | POA: Insufficient documentation

## 2018-03-13 DIAGNOSIS — Z87891 Personal history of nicotine dependence: Secondary | ICD-10-CM | POA: Diagnosis not present

## 2018-03-13 DIAGNOSIS — R1312 Dysphagia, oropharyngeal phase: Secondary | ICD-10-CM | POA: Diagnosis not present

## 2018-03-13 DIAGNOSIS — Z85828 Personal history of other malignant neoplasm of skin: Secondary | ICD-10-CM

## 2018-03-13 DIAGNOSIS — N183 Chronic kidney disease, stage 3 (moderate): Secondary | ICD-10-CM | POA: Diagnosis not present

## 2018-03-13 DIAGNOSIS — M6281 Muscle weakness (generalized): Secondary | ICD-10-CM | POA: Diagnosis not present

## 2018-03-13 DIAGNOSIS — E46 Unspecified protein-calorie malnutrition: Secondary | ICD-10-CM | POA: Diagnosis not present

## 2018-03-13 DIAGNOSIS — F0280 Dementia in other diseases classified elsewhere without behavioral disturbance: Secondary | ICD-10-CM | POA: Diagnosis not present

## 2018-03-13 NOTE — Progress Notes (Signed)
Pt in for a new patient visit daughter and son are present and verbalized they have health care power of attorney.

## 2018-03-14 ENCOUNTER — Other Ambulatory Visit: Payer: Self-pay | Admitting: *Deleted

## 2018-03-14 DIAGNOSIS — R471 Dysarthria and anarthria: Secondary | ICD-10-CM | POA: Diagnosis not present

## 2018-03-14 DIAGNOSIS — I129 Hypertensive chronic kidney disease with stage 1 through stage 4 chronic kidney disease, or unspecified chronic kidney disease: Secondary | ICD-10-CM | POA: Diagnosis not present

## 2018-03-14 DIAGNOSIS — I4891 Unspecified atrial fibrillation: Secondary | ICD-10-CM | POA: Diagnosis not present

## 2018-03-14 DIAGNOSIS — R1312 Dysphagia, oropharyngeal phase: Secondary | ICD-10-CM | POA: Diagnosis not present

## 2018-03-14 DIAGNOSIS — E46 Unspecified protein-calorie malnutrition: Secondary | ICD-10-CM | POA: Diagnosis not present

## 2018-03-14 DIAGNOSIS — F0280 Dementia in other diseases classified elsewhere without behavioral disturbance: Secondary | ICD-10-CM | POA: Diagnosis not present

## 2018-03-14 DIAGNOSIS — G3183 Dementia with Lewy bodies: Secondary | ICD-10-CM | POA: Diagnosis not present

## 2018-03-14 DIAGNOSIS — N183 Chronic kidney disease, stage 3 (moderate): Secondary | ICD-10-CM | POA: Diagnosis not present

## 2018-03-14 DIAGNOSIS — M6281 Muscle weakness (generalized): Secondary | ICD-10-CM | POA: Diagnosis not present

## 2018-03-15 DIAGNOSIS — J449 Chronic obstructive pulmonary disease, unspecified: Secondary | ICD-10-CM | POA: Diagnosis not present

## 2018-03-15 DIAGNOSIS — R2681 Unsteadiness on feet: Secondary | ICD-10-CM | POA: Diagnosis not present

## 2018-03-15 DIAGNOSIS — M129 Arthropathy, unspecified: Secondary | ICD-10-CM | POA: Diagnosis not present

## 2018-03-15 DIAGNOSIS — F0281 Dementia in other diseases classified elsewhere with behavioral disturbance: Secondary | ICD-10-CM | POA: Diagnosis not present

## 2018-03-15 DIAGNOSIS — F05 Delirium due to known physiological condition: Secondary | ICD-10-CM | POA: Diagnosis not present

## 2018-03-15 DIAGNOSIS — M542 Cervicalgia: Secondary | ICD-10-CM | POA: Diagnosis not present

## 2018-03-15 DIAGNOSIS — F0390 Unspecified dementia without behavioral disturbance: Secondary | ICD-10-CM | POA: Diagnosis not present

## 2018-03-15 DIAGNOSIS — G20 Parkinson's disease: Secondary | ICD-10-CM | POA: Diagnosis not present

## 2018-03-15 DIAGNOSIS — R296 Repeated falls: Secondary | ICD-10-CM | POA: Diagnosis not present

## 2018-03-15 DIAGNOSIS — L89152 Pressure ulcer of sacral region, stage 2: Secondary | ICD-10-CM | POA: Diagnosis not present

## 2018-03-17 ENCOUNTER — Other Ambulatory Visit: Payer: Self-pay | Admitting: *Deleted

## 2018-03-17 ENCOUNTER — Encounter: Payer: Self-pay | Admitting: Oncology

## 2018-03-17 DIAGNOSIS — R1312 Dysphagia, oropharyngeal phase: Secondary | ICD-10-CM | POA: Diagnosis not present

## 2018-03-17 DIAGNOSIS — E46 Unspecified protein-calorie malnutrition: Secondary | ICD-10-CM | POA: Diagnosis not present

## 2018-03-17 DIAGNOSIS — I129 Hypertensive chronic kidney disease with stage 1 through stage 4 chronic kidney disease, or unspecified chronic kidney disease: Secondary | ICD-10-CM | POA: Diagnosis not present

## 2018-03-17 DIAGNOSIS — R471 Dysarthria and anarthria: Secondary | ICD-10-CM | POA: Diagnosis not present

## 2018-03-17 DIAGNOSIS — I4891 Unspecified atrial fibrillation: Secondary | ICD-10-CM | POA: Diagnosis not present

## 2018-03-17 DIAGNOSIS — N183 Chronic kidney disease, stage 3 (moderate): Secondary | ICD-10-CM | POA: Diagnosis not present

## 2018-03-17 DIAGNOSIS — M6281 Muscle weakness (generalized): Secondary | ICD-10-CM | POA: Diagnosis not present

## 2018-03-17 DIAGNOSIS — G3183 Dementia with Lewy bodies: Secondary | ICD-10-CM | POA: Diagnosis not present

## 2018-03-17 DIAGNOSIS — F0280 Dementia in other diseases classified elsewhere without behavioral disturbance: Secondary | ICD-10-CM | POA: Diagnosis not present

## 2018-03-17 NOTE — Progress Notes (Signed)
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 fib, Lewy body dementia among other medical problems.  He currently lives at home with his son and there is home health coming to his house to help him with his ADLs.  He sees neurology and psychiatry as an outpatient.  Patient was recently seen in the ER on 02/26/2018 when he accidentally hit his elbow against a chair and there was a lesion on his elbow and started bleeding.  He has apparently had this lesion for about 2 years.  This was thought to be a calcified versus cyst and was excised in the ER.  Pathology showed carcinoma with basaloid features.  It involved the edges of the tissue.  The specimen was received in two pieces. The mass is a malignant  neoplasm with basaloid features and abundant stromal mucin. A few tiny  foci of squamous differentiation are present. There is minimal nuclear  palisading, and no definite retraction artifact. Mitotic figures are  numerous, and there is necrosis. No connection to the overlying skin is  observed. Immunohistochemistry (IHC) was performed for further  characterization, with the following results:  P40: Positive, diffuse strong nuclear staining in neoplastic cells  CK5/6: Positive, strong staining in >90% of neoplastic cells  CK7: Negative  TTF1: Negative  EMA: Negative  MOC31: Positive, moderate to strong staining in 80% of neoplastic cells  BCL2: Positive,  diffuse moderate to strong staining of neoplastic cells  BerEp4: Positive, diffuse strong staining of neoplastic cells  P16: Positive, strong staining of >70% of neoplastic cells.   The IHC profile is compatible with cutaneous basal cell carcinoma,  adenoid type, but the clinical presentation and absence of connection to  the overlying skin would be unusual for a primary skin cancer, unless  this is a recurrence. The IHC profile (EMA negative and MOC31 positive)  makes metastatic basaloid squamous cell carcinoma less likely, but  positivity for p16 is characteristic of basaloid squamous cell  carcinoma. However, p16 positivity can also be seen in basal cell  carcinoma (more often in sun-exposed skin). Because of the uncertainty  regarding the site of origin of this tumor, careful clinical history and  physical exam are recommended, with imaging studies if there is concern  for metastasis.   Of note patient has a history of squamous cell carcinoma that was excised from the skin of the face near his right lower eyelid back in 2012  Patient is here with his 2 other children.  He is sitting in a wheelchair and does not verbalize much.  He has been getting progressively weaker at home his appetite is fair and he has limited mobility around the house.  He has a pressure ulcer but has not had any recent falls.  His family was concerned that this is some kind of an aggressive hematology malignancy as this is what they believe was told to them by the ER.  They would not like the patient informed of any aggressive malignancy.  Their goal  is to focus on his quality of life and to minimize doctor's visits and hospitalizations in the stage     ECOG PS- 3  Pain scale- 3  Review of systems- Limited as patient does not verbalize much  Review of Systems  Constitutional: Positive for malaise/fatigue. Negative for chills, fever and weight loss.  HENT: Negative for congestion, ear discharge and nosebleeds.     Eyes: Negative for blurred vision.  Respiratory: Negative for cough, hemoptysis, sputum production, shortness of breath and wheezing.   Cardiovascular: Negative for chest pain, palpitations, orthopnea and claudication.  Gastrointestinal: Negative for abdominal pain, blood in stool, constipation, diarrhea, heartburn, melena, nausea and vomiting.  Genitourinary: Negative for dysuria, flank pain, frequency, hematuria and urgency.  Musculoskeletal: Negative for back pain, joint pain and myalgias.  Skin: Negative for rash.  Neurological: Negative for dizziness, tingling, focal weakness, seizures, weakness and headaches.  Endo/Heme/Allergies: Does not bruise/bleed easily.  Psychiatric/Behavioral: Negative for depression and suicidal ideas. The patient does not have insomnia.     Allergies  Allergen Reactions  . Bee Venom Anaphylaxis    Patient Active Problem List   Diagnosis Date Noted  . Goals of care, counseling/discussion   . Palliative care encounter   . Pressure injury of skin 10/09/2017  . Polypharmacy   . Supplemental oxygen dependent   . PAF (paroxysmal atrial fibrillation) ([place])   . Benign essential HTN   . Parkinson disease ([place])   . Hypoalbuminemia due to protein-calorie malnutrition ([place])   . Overdose 10/04/2017  . Altered mental status 10/04/2017  . UTI (urinary tract infection) 10/04/2017  . Anemia 10/04/2017  . Renal insufficiency 10/04/2017  . Paroxysmal A-fib ([place]) 01/11/2017  . Influenza A 01/09/2017  . COPD (chronic obstructive pulmonary disease) ([place]) 01/08/2017  . Dementia 09/13/2016  . Chest pain, rule out acute myocardial infarction 09/12/2016  . Fever 03/07/2013  . Acute encephalopathy 03/07/2013  . Weakness generalized 03/07/2013  . HYPERCHOLESTEROLEMIA  IIA 04/09/2009  . Essential hypertension 04/09/2009  . Coronary artery disease due to lipid rich plaque 04/09/2009  . COPD exacerbation ([place]) 04/09/2009  . OSTEOARTHRITIS 04/09/2009  . BENIGN  PROSTATIC HYPERTROPHY, HX OF 04/09/2009     Past Medical History:  Diagnosis Date  . Anxiety   . Arthritis    "bad in his knees" (01/09/2017)  . Benign prostatic hypertrophy    hx  . Chronic airway obstruction, not elsewhere classified   . Chronic bronchitis ([place])   . Coronary atherosclerosis of unspecified type of vessel, native or graft   . GERD (gastroesophageal reflux disease)   . New onset atrial fibrillation ([place]) 01/08/2017   [name] 01/08/2017  . On home oxygen therapy    "prn" (01/09/2017)  . Osteoarthrosis, unspecified whether generalized or localized, unspecified site   . Parkinson's disease ([place])    with dementia and psychosis.   . Pneumonia    "more than once" (01/09/2017)  . Pure hypercholesterolemia    IIA  . Skin cancer of face   . Stroke [facility])    "they think he had a minor stroke 1-2 yr ago" (01/09/2017)  . Unspecified essential hypertension      Past Surgical History:  Procedure Laterality Date  . CERVICAL DISCECTOMY    . FRACTURE SURGERY    . SKIN CANCER DESTRUCTION Right    "face"  . TIBIA FRACTURE SURGERY Right    "he's got a pin in there"    Social History   Socioeconomic History  . Marital status: Married  Spouse name: Not on file  . Number of children: Not on file  . Years of education: Not on file  . Highest education level: Not on file  Social Needs  . Financial resource strain: Not on file  . Food insecurity - worry: Not on file  . Food insecurity - inability: Not on file  . Transportation needs - medical: Not on file  . Transportation needs - non-medical: Not on file  Occupational History  . Not on file  Tobacco Use  . Smoking status: Former Smoker    Years: 40.00    Types: Cigarettes    Last attempt to quit: 1979    Years since quitting: 40.2  . Smokeless tobacco: Never Used  Substance and Sexual Activity  . Alcohol use: Yes    Comment: 01/09/2017 "nothing in the past several years"  . Drug use: No  . Sexual activity: No    Other Topics Concern  . Not on file  Social History Narrative   Retired, married.      Family History  Problem Relation Age of Onset  . Coronary artery disease Unknown        family hx  . Stroke Mother   . Stroke Father      Current Outpatient Medications:  .  carbidopa-levodopa (SINEMET IR) 25-100 MG tablet, Take 2 tablets by mouth 3 (three) times daily., Disp: 120 tablet, Rfl: 3 .  feeding supplement, ENSURE ENLIVE, (ENSURE ENLIVE) LIQD, Take 237 mLs by mouth 2 (two) times daily between meals., Disp: 237 mL, Rfl: 12 .  finasteride (PROSCAR) 5 MG tablet, Take 1 tablet (5 mg total) by mouth daily., Disp: 30 tablet, Rfl: 0 .  isosorbide mononitrate (IMDUR) 30 MG 24 hr tablet, Take 0.5 tablets (15 mg total) by mouth daily., Disp: 30 tablet, Rfl: 0 .  losartan (COZAAR) 50 MG tablet, Take 1 tablet (50 mg total) by mouth daily., Disp: 30 tablet, Rfl: 0 .  naphazoline-glycerin (CLEAR EYES) 0.012-0.2 % SOLN, Place 1-2 drops into both eyes 4 (four) times daily as needed for eye irritation., Disp: , Rfl: 0 .  QUEtiapine (SEROQUEL) 25 MG tablet, Take 1 tablet (25 mg total) by mouth at bedtime., Disp: 5 tablet, Rfl: 0 .  tamsulosin (FLOMAX) 0.4 MG CAPS, Take 0.4 mg by mouth daily., Disp: , Rfl:    Physical exam:  Vitals:   03/13/18 1107  BP: 105/70  Pulse: 76  Resp: 18  Temp: (!) 97 F (36.1 C)  TempSrc: Tympanic  Weight: 148 lb 8 oz (67.4 kg)   Physical Exam  Constitutional:  Elderly gentleman sitting in a wheelchair. Does not verbalize much  HENT:  Head: Normocephalic and atraumatic.  Eyes: EOM are normal. Pupils are equal, round, and reactive to light.  Neck: Normal range of motion.  Cardiovascular: Normal rate, regular rhythm and normal heart sounds.  Pulmonary/Chest: Effort normal and breath sounds normal.  Abdominal: Soft. Bowel sounds are normal.  Lymphadenopathy:  No palpable b/l axillary or cervical adenopathy. There are no other obvious suspicious sin lesions seen  grossly  Neurological: He is alert.  Oriented to self and place  Skin: Skin is warm and dry.       CMP Latest Ref Rng & Units 10/11/2017  Glucose 65 - 99 mg/dL -  BUN 6 - 20 mg/dL -  Creatinine 0.61 - 1.24 mg/dL 0.90  Sodium 135 - 145 mmol/L -  Potassium 3.5 - 5.1 mmol/L -  Chloride 101 - 111 mmol/L -  CO2  22 - 32 mmol/L -  Calcium 8.9 - 10.3 mg/dL -  Total Protein 6.5 - 8.1 g/dL -  Total Bilirubin 0.3 - 1.2 mg/dL -  Alkaline Phos 38 - 126 U/L -  AST 15 - 41 U/L -  ALT 17 - 63 U/L -   CBC Latest Ref Rng & Units 10/10/2017  WBC 4.0 - 10.5 K/uL 10.6(H)  Hemoglobin 13.0 - 17.0 g/dL 11.8(L)  Hematocrit 39.0 - 52.0 % 33.9(L)  Platelets 150 - 400 K/uL 242    No images are attached to the encounter.  Dg Chest Portable 1 View  Result Date: 02/26/2018 CLINICAL DATA:  Several days of productive cough, 1 week of intermittent low-grade fever. Generalized weakness. History of coronary artery disease, COPD, former smoker. EXAM: PORTABLE CHEST 1 VIEW COMPARISON:  Chest x-ray of October 10, 2017 FINDINGS: The lungs are mildly hypoinflated but clear. The heart is top-normal in size. The pulmonary vascularity is normal. There is tortuosity of the ascending and descending thoracic aorta. The trachea is midline. The bony thorax exhibits no acute abnormality. There are degenerative changes of both shoulders. IMPRESSION: Mild hypoinflation.  No acute pneumonia nor CHF. Electronically Signed   By: David  Martinique M.D.   On: 02/26/2018 13:21    Assessment and plan- Patient is a 84 y.o. male with incidentally diagnosed SCC with basaloid features versus basal cell carcinoma involving skin of the elbow  I discussed the results of pathology with the patients family after patient is ok with further discussion with family. It is unclear if this SCC/ basal cell carcinoma of the elbow originated in the elbow or at some other primary site. Patient does have a h/o SCC involving skin of his face that was excised  in 2012. To assess if this is primary or metastatic ideally ct scans or PET scan would be indicated. Even if metastatic disease disease is found, patient is not a candidate for systemic therapy. Patients children who are his designated HCP understand and do not wish to pursue staging scans. They would like to focus on QOL and symptoms and not interested in pursuing aggressive treatment  Given that carcinoma was involving the edge of the tissue (but no connection to overlying skin), I will refer the patient to [facility] to see if there would be role for re excision at this time. He may also need continues follow up with dermatology for yearly skin exams if famiyl desires.   Patient does not require any oncology follow up at this time. I have not checked labs today. Labs from oct 2018 were relatively stable. Mild anemia but no other concerning findings    Thank you for this kind referral and the opportunity to participate in the care of this patient   Visit Diagnosis 1. Squamous cell carcinoma in situ (SCCIS) of skin of right forearm     Dr. Randa Evens, MD, MPH [place] [facility] Pager- [phone] 03/17/2018

## 2018-03-18 ENCOUNTER — Telehealth: Payer: Self-pay | Admitting: *Deleted

## 2018-03-18 ENCOUNTER — Other Ambulatory Visit: Payer: Self-pay | Admitting: *Deleted

## 2018-03-18 DIAGNOSIS — R269 Unspecified abnormalities of gait and mobility: Secondary | ICD-10-CM | POA: Diagnosis not present

## 2018-03-18 DIAGNOSIS — I4891 Unspecified atrial fibrillation: Secondary | ICD-10-CM | POA: Diagnosis not present

## 2018-03-18 DIAGNOSIS — G3183 Dementia with Lewy bodies: Secondary | ICD-10-CM | POA: Diagnosis not present

## 2018-03-18 DIAGNOSIS — M1711 Unilateral primary osteoarthritis, right knee: Secondary | ICD-10-CM | POA: Diagnosis not present

## 2018-03-18 DIAGNOSIS — E46 Unspecified protein-calorie malnutrition: Secondary | ICD-10-CM | POA: Diagnosis not present

## 2018-03-18 DIAGNOSIS — R1312 Dysphagia, oropharyngeal phase: Secondary | ICD-10-CM | POA: Diagnosis not present

## 2018-03-18 DIAGNOSIS — H01009 Unspecified blepharitis unspecified eye, unspecified eyelid: Secondary | ICD-10-CM | POA: Diagnosis not present

## 2018-03-18 DIAGNOSIS — M1712 Unilateral primary osteoarthritis, left knee: Secondary | ICD-10-CM | POA: Diagnosis not present

## 2018-03-18 DIAGNOSIS — M6281 Muscle weakness (generalized): Secondary | ICD-10-CM | POA: Diagnosis not present

## 2018-03-18 DIAGNOSIS — N189 Chronic kidney disease, unspecified: Secondary | ICD-10-CM | POA: Diagnosis not present

## 2018-03-18 DIAGNOSIS — F0280 Dementia in other diseases classified elsewhere without behavioral disturbance: Secondary | ICD-10-CM | POA: Diagnosis not present

## 2018-03-18 DIAGNOSIS — R471 Dysarthria and anarthria: Secondary | ICD-10-CM | POA: Diagnosis not present

## 2018-03-18 DIAGNOSIS — I10 Essential (primary) hypertension: Secondary | ICD-10-CM | POA: Diagnosis not present

## 2018-03-18 DIAGNOSIS — I129 Hypertensive chronic kidney disease with stage 1 through stage 4 chronic kidney disease, or unspecified chronic kidney disease: Secondary | ICD-10-CM | POA: Diagnosis not present

## 2018-03-18 DIAGNOSIS — R609 Edema, unspecified: Secondary | ICD-10-CM | POA: Diagnosis not present

## 2018-03-18 DIAGNOSIS — I69320 Aphasia following cerebral infarction: Secondary | ICD-10-CM | POA: Diagnosis not present

## 2018-03-18 DIAGNOSIS — N183 Chronic kidney disease, stage 3 (moderate): Secondary | ICD-10-CM | POA: Diagnosis not present

## 2018-03-18 NOTE — Telephone Encounter (Signed)
Faxed referral to [facility] for skin cancer lesion. Fax confirmation received.     [facility]

## 2018-03-19 DIAGNOSIS — R471 Dysarthria and anarthria: Secondary | ICD-10-CM | POA: Diagnosis not present

## 2018-03-19 DIAGNOSIS — M6281 Muscle weakness (generalized): Secondary | ICD-10-CM | POA: Diagnosis not present

## 2018-03-19 DIAGNOSIS — R1312 Dysphagia, oropharyngeal phase: Secondary | ICD-10-CM | POA: Diagnosis not present

## 2018-03-19 DIAGNOSIS — F0280 Dementia in other diseases classified elsewhere without behavioral disturbance: Secondary | ICD-10-CM | POA: Diagnosis not present

## 2018-03-19 DIAGNOSIS — I129 Hypertensive chronic kidney disease with stage 1 through stage 4 chronic kidney disease, or unspecified chronic kidney disease: Secondary | ICD-10-CM | POA: Diagnosis not present

## 2018-03-19 DIAGNOSIS — G3183 Dementia with Lewy bodies: Secondary | ICD-10-CM | POA: Diagnosis not present

## 2018-03-19 DIAGNOSIS — N183 Chronic kidney disease, stage 3 (moderate): Secondary | ICD-10-CM | POA: Diagnosis not present

## 2018-03-19 DIAGNOSIS — E46 Unspecified protein-calorie malnutrition: Secondary | ICD-10-CM | POA: Diagnosis not present

## 2018-03-19 DIAGNOSIS — I4891 Unspecified atrial fibrillation: Secondary | ICD-10-CM | POA: Diagnosis not present

## 2018-03-20 DIAGNOSIS — R471 Dysarthria and anarthria: Secondary | ICD-10-CM | POA: Diagnosis not present

## 2018-03-20 DIAGNOSIS — E46 Unspecified protein-calorie malnutrition: Secondary | ICD-10-CM | POA: Diagnosis not present

## 2018-03-20 DIAGNOSIS — M6281 Muscle weakness (generalized): Secondary | ICD-10-CM | POA: Diagnosis not present

## 2018-03-20 DIAGNOSIS — N183 Chronic kidney disease, stage 3 (moderate): Secondary | ICD-10-CM | POA: Diagnosis not present

## 2018-03-20 DIAGNOSIS — G3183 Dementia with Lewy bodies: Secondary | ICD-10-CM | POA: Diagnosis not present

## 2018-03-20 DIAGNOSIS — F0280 Dementia in other diseases classified elsewhere without behavioral disturbance: Secondary | ICD-10-CM | POA: Diagnosis not present

## 2018-03-20 DIAGNOSIS — R1312 Dysphagia, oropharyngeal phase: Secondary | ICD-10-CM | POA: Diagnosis not present

## 2018-03-20 DIAGNOSIS — I4891 Unspecified atrial fibrillation: Secondary | ICD-10-CM | POA: Diagnosis not present

## 2018-03-20 DIAGNOSIS — I129 Hypertensive chronic kidney disease with stage 1 through stage 4 chronic kidney disease, or unspecified chronic kidney disease: Secondary | ICD-10-CM | POA: Diagnosis not present

## 2018-03-21 DIAGNOSIS — G3183 Dementia with Lewy bodies: Secondary | ICD-10-CM | POA: Diagnosis not present

## 2018-03-21 DIAGNOSIS — I129 Hypertensive chronic kidney disease with stage 1 through stage 4 chronic kidney disease, or unspecified chronic kidney disease: Secondary | ICD-10-CM | POA: Diagnosis not present

## 2018-03-21 DIAGNOSIS — I4891 Unspecified atrial fibrillation: Secondary | ICD-10-CM | POA: Diagnosis not present

## 2018-03-21 DIAGNOSIS — F0280 Dementia in other diseases classified elsewhere without behavioral disturbance: Secondary | ICD-10-CM | POA: Diagnosis not present

## 2018-03-21 DIAGNOSIS — N183 Chronic kidney disease, stage 3 (moderate): Secondary | ICD-10-CM | POA: Diagnosis not present

## 2018-03-21 DIAGNOSIS — M6281 Muscle weakness (generalized): Secondary | ICD-10-CM | POA: Diagnosis not present

## 2018-03-21 DIAGNOSIS — R471 Dysarthria and anarthria: Secondary | ICD-10-CM | POA: Diagnosis not present

## 2018-03-21 DIAGNOSIS — R1312 Dysphagia, oropharyngeal phase: Secondary | ICD-10-CM | POA: Diagnosis not present

## 2018-03-21 DIAGNOSIS — E46 Unspecified protein-calorie malnutrition: Secondary | ICD-10-CM | POA: Diagnosis not present

## 2018-03-24 DIAGNOSIS — I129 Hypertensive chronic kidney disease with stage 1 through stage 4 chronic kidney disease, or unspecified chronic kidney disease: Secondary | ICD-10-CM | POA: Diagnosis not present

## 2018-03-24 DIAGNOSIS — E46 Unspecified protein-calorie malnutrition: Secondary | ICD-10-CM | POA: Diagnosis not present

## 2018-03-24 DIAGNOSIS — F0280 Dementia in other diseases classified elsewhere without behavioral disturbance: Secondary | ICD-10-CM | POA: Diagnosis not present

## 2018-03-24 DIAGNOSIS — M6281 Muscle weakness (generalized): Secondary | ICD-10-CM | POA: Diagnosis not present

## 2018-03-24 DIAGNOSIS — G3183 Dementia with Lewy bodies: Secondary | ICD-10-CM | POA: Diagnosis not present

## 2018-03-24 DIAGNOSIS — I4891 Unspecified atrial fibrillation: Secondary | ICD-10-CM | POA: Diagnosis not present

## 2018-03-24 DIAGNOSIS — N183 Chronic kidney disease, stage 3 (moderate): Secondary | ICD-10-CM | POA: Diagnosis not present

## 2018-03-24 DIAGNOSIS — R1312 Dysphagia, oropharyngeal phase: Secondary | ICD-10-CM | POA: Diagnosis not present

## 2018-03-24 DIAGNOSIS — R471 Dysarthria and anarthria: Secondary | ICD-10-CM | POA: Diagnosis not present

## 2018-03-26 DIAGNOSIS — R471 Dysarthria and anarthria: Secondary | ICD-10-CM | POA: Diagnosis not present

## 2018-03-26 DIAGNOSIS — G3183 Dementia with Lewy bodies: Secondary | ICD-10-CM | POA: Diagnosis not present

## 2018-03-26 DIAGNOSIS — R1312 Dysphagia, oropharyngeal phase: Secondary | ICD-10-CM | POA: Diagnosis not present

## 2018-03-26 DIAGNOSIS — M6281 Muscle weakness (generalized): Secondary | ICD-10-CM | POA: Diagnosis not present

## 2018-03-26 DIAGNOSIS — F0280 Dementia in other diseases classified elsewhere without behavioral disturbance: Secondary | ICD-10-CM | POA: Diagnosis not present

## 2018-03-26 DIAGNOSIS — N183 Chronic kidney disease, stage 3 (moderate): Secondary | ICD-10-CM | POA: Diagnosis not present

## 2018-03-26 DIAGNOSIS — I129 Hypertensive chronic kidney disease with stage 1 through stage 4 chronic kidney disease, or unspecified chronic kidney disease: Secondary | ICD-10-CM | POA: Diagnosis not present

## 2018-03-26 DIAGNOSIS — E46 Unspecified protein-calorie malnutrition: Secondary | ICD-10-CM | POA: Diagnosis not present

## 2018-03-26 DIAGNOSIS — I4891 Unspecified atrial fibrillation: Secondary | ICD-10-CM | POA: Diagnosis not present

## 2018-03-27 DIAGNOSIS — F0280 Dementia in other diseases classified elsewhere without behavioral disturbance: Secondary | ICD-10-CM | POA: Diagnosis not present

## 2018-03-27 DIAGNOSIS — R471 Dysarthria and anarthria: Secondary | ICD-10-CM | POA: Diagnosis not present

## 2018-03-27 DIAGNOSIS — G3183 Dementia with Lewy bodies: Secondary | ICD-10-CM | POA: Diagnosis not present

## 2018-03-27 DIAGNOSIS — E46 Unspecified protein-calorie malnutrition: Secondary | ICD-10-CM | POA: Diagnosis not present

## 2018-03-27 DIAGNOSIS — N183 Chronic kidney disease, stage 3 (moderate): Secondary | ICD-10-CM | POA: Diagnosis not present

## 2018-03-27 DIAGNOSIS — R1312 Dysphagia, oropharyngeal phase: Secondary | ICD-10-CM | POA: Diagnosis not present

## 2018-03-27 DIAGNOSIS — M6281 Muscle weakness (generalized): Secondary | ICD-10-CM | POA: Diagnosis not present

## 2018-03-27 DIAGNOSIS — I4891 Unspecified atrial fibrillation: Secondary | ICD-10-CM | POA: Diagnosis not present

## 2018-03-27 DIAGNOSIS — I129 Hypertensive chronic kidney disease with stage 1 through stage 4 chronic kidney disease, or unspecified chronic kidney disease: Secondary | ICD-10-CM | POA: Diagnosis not present

## 2018-03-31 ENCOUNTER — Encounter: Payer: Self-pay | Admitting: Neurology

## 2018-03-31 DIAGNOSIS — L89152 Pressure ulcer of sacral region, stage 2: Secondary | ICD-10-CM | POA: Diagnosis not present

## 2018-03-31 DIAGNOSIS — R1312 Dysphagia, oropharyngeal phase: Secondary | ICD-10-CM | POA: Diagnosis not present

## 2018-03-31 DIAGNOSIS — R471 Dysarthria and anarthria: Secondary | ICD-10-CM | POA: Diagnosis not present

## 2018-03-31 DIAGNOSIS — N183 Chronic kidney disease, stage 3 (moderate): Secondary | ICD-10-CM | POA: Diagnosis not present

## 2018-03-31 DIAGNOSIS — F0280 Dementia in other diseases classified elsewhere without behavioral disturbance: Secondary | ICD-10-CM | POA: Diagnosis not present

## 2018-03-31 DIAGNOSIS — Z79899 Other long term (current) drug therapy: Secondary | ICD-10-CM | POA: Diagnosis not present

## 2018-03-31 DIAGNOSIS — I4891 Unspecified atrial fibrillation: Secondary | ICD-10-CM | POA: Diagnosis not present

## 2018-03-31 DIAGNOSIS — G3183 Dementia with Lewy bodies: Secondary | ICD-10-CM | POA: Diagnosis not present

## 2018-03-31 DIAGNOSIS — E46 Unspecified protein-calorie malnutrition: Secondary | ICD-10-CM | POA: Diagnosis not present

## 2018-03-31 DIAGNOSIS — I129 Hypertensive chronic kidney disease with stage 1 through stage 4 chronic kidney disease, or unspecified chronic kidney disease: Secondary | ICD-10-CM | POA: Diagnosis not present

## 2018-04-02 DIAGNOSIS — R1312 Dysphagia, oropharyngeal phase: Secondary | ICD-10-CM | POA: Diagnosis not present

## 2018-04-02 DIAGNOSIS — G3183 Dementia with Lewy bodies: Secondary | ICD-10-CM | POA: Diagnosis not present

## 2018-04-02 DIAGNOSIS — R471 Dysarthria and anarthria: Secondary | ICD-10-CM | POA: Diagnosis not present

## 2018-04-02 DIAGNOSIS — L89152 Pressure ulcer of sacral region, stage 2: Secondary | ICD-10-CM | POA: Diagnosis not present

## 2018-04-02 DIAGNOSIS — N183 Chronic kidney disease, stage 3 (moderate): Secondary | ICD-10-CM | POA: Diagnosis not present

## 2018-04-02 DIAGNOSIS — E46 Unspecified protein-calorie malnutrition: Secondary | ICD-10-CM | POA: Diagnosis not present

## 2018-04-02 DIAGNOSIS — I4891 Unspecified atrial fibrillation: Secondary | ICD-10-CM | POA: Diagnosis not present

## 2018-04-02 DIAGNOSIS — F0280 Dementia in other diseases classified elsewhere without behavioral disturbance: Secondary | ICD-10-CM | POA: Diagnosis not present

## 2018-04-02 DIAGNOSIS — I129 Hypertensive chronic kidney disease with stage 1 through stage 4 chronic kidney disease, or unspecified chronic kidney disease: Secondary | ICD-10-CM | POA: Diagnosis not present

## 2018-04-03 DIAGNOSIS — G3183 Dementia with Lewy bodies: Secondary | ICD-10-CM | POA: Diagnosis not present

## 2018-04-03 DIAGNOSIS — L89152 Pressure ulcer of sacral region, stage 2: Secondary | ICD-10-CM | POA: Diagnosis not present

## 2018-04-03 DIAGNOSIS — I4891 Unspecified atrial fibrillation: Secondary | ICD-10-CM | POA: Diagnosis not present

## 2018-04-03 DIAGNOSIS — E46 Unspecified protein-calorie malnutrition: Secondary | ICD-10-CM | POA: Diagnosis not present

## 2018-04-03 DIAGNOSIS — I129 Hypertensive chronic kidney disease with stage 1 through stage 4 chronic kidney disease, or unspecified chronic kidney disease: Secondary | ICD-10-CM | POA: Diagnosis not present

## 2018-04-03 DIAGNOSIS — F0280 Dementia in other diseases classified elsewhere without behavioral disturbance: Secondary | ICD-10-CM | POA: Diagnosis not present

## 2018-04-03 DIAGNOSIS — R1312 Dysphagia, oropharyngeal phase: Secondary | ICD-10-CM | POA: Diagnosis not present

## 2018-04-03 DIAGNOSIS — N183 Chronic kidney disease, stage 3 (moderate): Secondary | ICD-10-CM | POA: Diagnosis not present

## 2018-04-03 DIAGNOSIS — R471 Dysarthria and anarthria: Secondary | ICD-10-CM | POA: Diagnosis not present

## 2018-04-10 DIAGNOSIS — I129 Hypertensive chronic kidney disease with stage 1 through stage 4 chronic kidney disease, or unspecified chronic kidney disease: Secondary | ICD-10-CM | POA: Diagnosis not present

## 2018-04-10 DIAGNOSIS — I4891 Unspecified atrial fibrillation: Secondary | ICD-10-CM | POA: Diagnosis not present

## 2018-04-10 DIAGNOSIS — E46 Unspecified protein-calorie malnutrition: Secondary | ICD-10-CM | POA: Diagnosis not present

## 2018-04-10 DIAGNOSIS — R1312 Dysphagia, oropharyngeal phase: Secondary | ICD-10-CM | POA: Diagnosis not present

## 2018-04-10 DIAGNOSIS — L89152 Pressure ulcer of sacral region, stage 2: Secondary | ICD-10-CM | POA: Diagnosis not present

## 2018-04-10 DIAGNOSIS — F0280 Dementia in other diseases classified elsewhere without behavioral disturbance: Secondary | ICD-10-CM | POA: Diagnosis not present

## 2018-04-10 DIAGNOSIS — R471 Dysarthria and anarthria: Secondary | ICD-10-CM | POA: Diagnosis not present

## 2018-04-10 DIAGNOSIS — G3183 Dementia with Lewy bodies: Secondary | ICD-10-CM | POA: Diagnosis not present

## 2018-04-10 DIAGNOSIS — N183 Chronic kidney disease, stage 3 (moderate): Secondary | ICD-10-CM | POA: Diagnosis not present
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
## 2018-04-14 DIAGNOSIS — R1310 Dysphagia, unspecified: Secondary | ICD-10-CM | POA: Diagnosis not present

## 2018-04-14 DIAGNOSIS — F0280 Dementia in other diseases classified elsewhere without behavioral disturbance: Secondary | ICD-10-CM | POA: Diagnosis not present

## 2018-04-14 DIAGNOSIS — I129 Hypertensive chronic kidney disease with stage 1 through stage 4 chronic kidney disease, or unspecified chronic kidney disease: Secondary | ICD-10-CM | POA: Diagnosis not present

## 2018-04-14 DIAGNOSIS — R471 Dysarthria and anarthria: Secondary | ICD-10-CM | POA: Diagnosis not present

## 2018-04-14 DIAGNOSIS — L89152 Pressure ulcer of sacral region, stage 2: Secondary | ICD-10-CM | POA: Diagnosis not present

## 2018-04-14 DIAGNOSIS — G3183 Dementia with Lewy bodies: Secondary | ICD-10-CM | POA: Diagnosis not present

## 2018-04-14 DIAGNOSIS — E46 Unspecified protein-calorie malnutrition: Secondary | ICD-10-CM | POA: Diagnosis not present

## 2018-04-14 DIAGNOSIS — I4891 Unspecified atrial fibrillation: Secondary | ICD-10-CM | POA: Diagnosis not present

## 2018-04-14 DIAGNOSIS — N183 Chronic kidney disease, stage 3 (moderate): Secondary | ICD-10-CM | POA: Diagnosis not present

## 2018-04-15 ENCOUNTER — Ambulatory Visit (INDEPENDENT_AMBULATORY_CARE_PROVIDER_SITE_OTHER): Payer: Medicare HMO | Admitting: Neurology

## 2018-04-15 ENCOUNTER — Encounter: Payer: Self-pay | Admitting: Neurology

## 2018-04-15 VITALS — BP 106/66 | HR 68

## 2018-04-15 DIAGNOSIS — F05 Delirium due to known physiological condition: Secondary | ICD-10-CM | POA: Diagnosis not present

## 2018-04-15 DIAGNOSIS — F0390 Unspecified dementia without behavioral disturbance: Secondary | ICD-10-CM | POA: Diagnosis not present

## 2018-04-15 DIAGNOSIS — F0280 Dementia in other diseases classified elsewhere without behavioral disturbance: Secondary | ICD-10-CM

## 2018-04-15 DIAGNOSIS — R2681 Unsteadiness on feet: Secondary | ICD-10-CM | POA: Diagnosis not present

## 2018-04-15 DIAGNOSIS — R296 Repeated falls: Secondary | ICD-10-CM | POA: Diagnosis not present

## 2018-04-15 DIAGNOSIS — G20 Parkinson's disease: Secondary | ICD-10-CM

## 2018-04-15 DIAGNOSIS — M129 Arthropathy, unspecified: Secondary | ICD-10-CM | POA: Diagnosis not present

## 2018-04-15 DIAGNOSIS — J449 Chronic obstructive pulmonary disease, unspecified: Secondary | ICD-10-CM | POA: Diagnosis not present

## 2018-04-15 DIAGNOSIS — F0281 Dementia in other diseases classified elsewhere with behavioral disturbance: Secondary | ICD-10-CM | POA: Diagnosis not present

## 2018-04-15 DIAGNOSIS — G20A1 Parkinson's disease without dyskinesia, without mention of fluctuations: Secondary | ICD-10-CM

## 2018-04-15 DIAGNOSIS — M542 Cervicalgia: Secondary | ICD-10-CM | POA: Diagnosis not present

## 2018-04-15 DIAGNOSIS — L89152 Pressure ulcer of sacral region, stage 2: Secondary | ICD-10-CM | POA: Diagnosis not present

## 2018-04-15 NOTE — Patient Instructions (Signed)
1.  You carry a diagnosis of Parkinsons disease, and I agree with this diagnosis.  You can let me know if you change your mind regarding taking carbidopa/levodopa 25/100 three times per day.  As we discussed, this medication can help mobility, transfers, getting in and out of the bed, etc.  As you and I discussed, I would not recommend risperdone for you given that it can induce parkinsonism, even in patients who do not have it.   2.  I will see you on an as needed basis

## 2018-04-16 DIAGNOSIS — N183 Chronic kidney disease, stage 3 (moderate): Secondary | ICD-10-CM | POA: Diagnosis not present

## 2018-04-16 DIAGNOSIS — R471 Dysarthria and anarthria: Secondary | ICD-10-CM | POA: Diagnosis not present

## 2018-04-16 DIAGNOSIS — I4891 Unspecified atrial fibrillation: Secondary | ICD-10-CM | POA: Diagnosis not present

## 2018-04-16 DIAGNOSIS — L89152 Pressure ulcer of sacral region, stage 2: Secondary | ICD-10-CM | POA: Diagnosis not present

## 2018-04-16 DIAGNOSIS — E46 Unspecified protein-calorie malnutrition: Secondary | ICD-10-CM | POA: Diagnosis not present

## 2018-04-16 DIAGNOSIS — I129 Hypertensive chronic kidney disease with stage 1 through stage 4 chronic kidney disease, or unspecified chronic kidney disease: Secondary | ICD-10-CM | POA: Diagnosis not present

## 2018-04-16 DIAGNOSIS — G3183 Dementia with Lewy bodies: Secondary | ICD-10-CM | POA: Diagnosis not present

## 2018-04-16 DIAGNOSIS — R1312 Dysphagia, oropharyngeal phase: Secondary | ICD-10-CM | POA: Diagnosis not present

## 2018-04-16 DIAGNOSIS — F0280 Dementia in other diseases classified elsewhere without behavioral disturbance: Secondary | ICD-10-CM | POA: Diagnosis not present

## 2018-04-17 DIAGNOSIS — G3183 Dementia with Lewy bodies: Secondary | ICD-10-CM | POA: Diagnosis not present

## 2018-04-17 DIAGNOSIS — N183 Chronic kidney disease, stage 3 (moderate): Secondary | ICD-10-CM | POA: Diagnosis not present

## 2018-04-17 DIAGNOSIS — F0280 Dementia in other diseases classified elsewhere without behavioral disturbance: Secondary | ICD-10-CM | POA: Diagnosis not present

## 2018-04-17 DIAGNOSIS — L89152 Pressure ulcer of sacral region, stage 2: Secondary | ICD-10-CM | POA: Diagnosis not present

## 2018-04-17 DIAGNOSIS — R1312 Dysphagia, oropharyngeal phase: Secondary | ICD-10-CM | POA: Diagnosis not present

## 2018-04-17 DIAGNOSIS — I129 Hypertensive chronic kidney disease with stage 1 through stage 4 chronic kidney disease, or unspecified chronic kidney disease: Secondary | ICD-10-CM | POA: Diagnosis not present

## 2018-04-17 DIAGNOSIS — E46 Unspecified protein-calorie malnutrition: Secondary | ICD-10-CM | POA: Diagnosis not present

## 2018-04-17 DIAGNOSIS — I4891 Unspecified atrial fibrillation: Secondary | ICD-10-CM | POA: Diagnosis not present

## 2018-04-17 DIAGNOSIS — R471 Dysarthria and anarthria: Secondary | ICD-10-CM | POA: Diagnosis not present

## 2018-04-25 DIAGNOSIS — I129 Hypertensive chronic kidney disease with stage 1 through stage 4 chronic kidney disease, or unspecified chronic kidney disease: Secondary | ICD-10-CM | POA: Diagnosis not present

## 2018-04-25 DIAGNOSIS — I4891 Unspecified atrial fibrillation: Secondary | ICD-10-CM | POA: Diagnosis not present

## 2018-04-25 DIAGNOSIS — R471 Dysarthria and anarthria: Secondary | ICD-10-CM | POA: Diagnosis not present

## 2018-04-25 DIAGNOSIS — N183 Chronic kidney disease, stage 3 (moderate): Secondary | ICD-10-CM | POA: Diagnosis not present

## 2018-04-25 DIAGNOSIS — R1312 Dysphagia, oropharyngeal phase: Secondary | ICD-10-CM | POA: Diagnosis not present

## 2018-04-25 DIAGNOSIS — F0280 Dementia in other diseases classified elsewhere without behavioral disturbance: Secondary | ICD-10-CM | POA: Diagnosis not present

## 2018-04-25 DIAGNOSIS — L89152 Pressure ulcer of sacral region, stage 2: Secondary | ICD-10-CM | POA: Diagnosis not present

## 2018-04-25 DIAGNOSIS — E46 Unspecified protein-calorie malnutrition: Secondary | ICD-10-CM | POA: Diagnosis not present

## 2018-04-25 DIAGNOSIS — G3183 Dementia with Lewy bodies: Secondary | ICD-10-CM | POA: Diagnosis not present

## 2018-04-30 DIAGNOSIS — G3183 Dementia with Lewy bodies: Secondary | ICD-10-CM | POA: Diagnosis not present

## 2018-04-30 DIAGNOSIS — R1312 Dysphagia, oropharyngeal phase: Secondary | ICD-10-CM | POA: Diagnosis not present

## 2018-04-30 DIAGNOSIS — R471 Dysarthria and anarthria: Secondary | ICD-10-CM | POA: Diagnosis not present

## 2018-04-30 DIAGNOSIS — E46 Unspecified protein-calorie malnutrition: Secondary | ICD-10-CM | POA: Diagnosis not present

## 2018-04-30 DIAGNOSIS — L89152 Pressure ulcer of sacral region, stage 2: Secondary | ICD-10-CM | POA: Diagnosis not present

## 2018-04-30 DIAGNOSIS — N183 Chronic kidney disease, stage 3 (moderate): Secondary | ICD-10-CM | POA: Diagnosis not present

## 2018-04-30 DIAGNOSIS — I4891 Unspecified atrial fibrillation: Secondary | ICD-10-CM | POA: Diagnosis not present

## 2018-04-30 DIAGNOSIS — I129 Hypertensive chronic kidney disease with stage 1 through stage 4 chronic kidney disease, or unspecified chronic kidney disease: Secondary | ICD-10-CM | POA: Diagnosis not present

## 2018-04-30 DIAGNOSIS — F0280 Dementia in other diseases classified elsewhere without behavioral disturbance: Secondary | ICD-10-CM | POA: Diagnosis not present

## 2018-05-08 DIAGNOSIS — I4891 Unspecified atrial fibrillation: Secondary | ICD-10-CM | POA: Diagnosis not present

## 2018-05-08 DIAGNOSIS — E46 Unspecified protein-calorie malnutrition: Secondary | ICD-10-CM | POA: Diagnosis not present

## 2018-05-08 DIAGNOSIS — N183 Chronic kidney disease, stage 3 (moderate): Secondary | ICD-10-CM | POA: Diagnosis not present

## 2018-05-08 DIAGNOSIS — R471 Dysarthria and anarthria: Secondary | ICD-10-CM | POA: Diagnosis not present

## 2018-05-08 DIAGNOSIS — I129 Hypertensive chronic kidney disease with stage 1 through stage 4 chronic kidney disease, or unspecified chronic kidney disease: Secondary | ICD-10-CM | POA: Diagnosis not present

## 2018-05-08 DIAGNOSIS — R1312 Dysphagia, oropharyngeal phase: Secondary | ICD-10-CM | POA: Diagnosis not present

## 2018-05-08 DIAGNOSIS — G3183 Dementia with Lewy bodies: Secondary | ICD-10-CM | POA: Diagnosis not present

## 2018-05-08 DIAGNOSIS — L89152 Pressure ulcer of sacral region, stage 2: Secondary | ICD-10-CM | POA: Diagnosis not present

## 2018-05-08 DIAGNOSIS — F0280 Dementia in other diseases classified elsewhere without behavioral disturbance: Secondary | ICD-10-CM | POA: Diagnosis not present

## 2018-05-09 DIAGNOSIS — R471 Dysarthria and anarthria: Secondary | ICD-10-CM | POA: Diagnosis not present

## 2018-05-09 DIAGNOSIS — L89152 Pressure ulcer of sacral region, stage 2: Secondary | ICD-10-CM | POA: Diagnosis not present

## 2018-05-09 DIAGNOSIS — F0280 Dementia in other diseases classified elsewhere without behavioral disturbance: Secondary | ICD-10-CM | POA: Diagnosis not present

## 2018-05-09 DIAGNOSIS — E46 Unspecified protein-calorie malnutrition: Secondary | ICD-10-CM | POA: Diagnosis not present

## 2018-05-09 DIAGNOSIS — I129 Hypertensive chronic kidney disease with stage 1 through stage 4 chronic kidney disease, or unspecified chronic kidney disease: Secondary | ICD-10-CM | POA: Diagnosis not present

## 2018-05-09 DIAGNOSIS — N183 Chronic kidney disease, stage 3 (moderate): Secondary | ICD-10-CM | POA: Diagnosis not present

## 2018-05-09 DIAGNOSIS — R1312 Dysphagia, oropharyngeal phase: Secondary | ICD-10-CM | POA: Diagnosis not present

## 2018-05-09 DIAGNOSIS — I4891 Unspecified atrial fibrillation: Secondary | ICD-10-CM | POA: Diagnosis not present

## 2018-05-09 DIAGNOSIS — G3183 Dementia with Lewy bodies: Secondary | ICD-10-CM | POA: Diagnosis not present

## 2018-05-12 DIAGNOSIS — R471 Dysarthria and anarthria: Secondary | ICD-10-CM | POA: Diagnosis not present

## 2018-05-12 DIAGNOSIS — N400 Enlarged prostate without lower urinary tract symptoms: Secondary | ICD-10-CM | POA: Diagnosis not present

## 2018-05-12 DIAGNOSIS — I4891 Unspecified atrial fibrillation: Secondary | ICD-10-CM | POA: Diagnosis not present

## 2018-05-12 DIAGNOSIS — G3183 Dementia with Lewy bodies: Secondary | ICD-10-CM | POA: Diagnosis not present

## 2018-05-12 DIAGNOSIS — F0280 Dementia in other diseases classified elsewhere without behavioral disturbance: Secondary | ICD-10-CM | POA: Diagnosis not present

## 2018-05-12 DIAGNOSIS — N183 Chronic kidney disease, stage 3 (moderate): Secondary | ICD-10-CM | POA: Diagnosis not present

## 2018-05-12 DIAGNOSIS — R1312 Dysphagia, oropharyngeal phase: Secondary | ICD-10-CM | POA: Diagnosis not present

## 2018-05-12 DIAGNOSIS — L89152 Pressure ulcer of sacral region, stage 2: Secondary | ICD-10-CM | POA: Diagnosis not present

## 2018-05-12 DIAGNOSIS — I129 Hypertensive chronic kidney disease with stage 1 through stage 4 chronic kidney disease, or unspecified chronic kidney disease: Secondary | ICD-10-CM | POA: Diagnosis not present

## 2018-05-12 DIAGNOSIS — E46 Unspecified protein-calorie malnutrition: Secondary | ICD-10-CM | POA: Diagnosis not present

## 2018-05-15 DIAGNOSIS — G20 Parkinson's disease: Secondary | ICD-10-CM | POA: Diagnosis not present

## 2018-05-15 DIAGNOSIS — F05 Delirium due to known physiological condition: Secondary | ICD-10-CM | POA: Diagnosis not present

## 2018-05-15 DIAGNOSIS — F0281 Dementia in other diseases classified elsewhere with behavioral disturbance: Secondary | ICD-10-CM | POA: Diagnosis not present

## 2018-05-15 DIAGNOSIS — M129 Arthropathy, unspecified: Secondary | ICD-10-CM | POA: Diagnosis not present

## 2018-05-15 DIAGNOSIS — R2681 Unsteadiness on feet: Secondary | ICD-10-CM | POA: Diagnosis not present

## 2018-05-15 DIAGNOSIS — M542 Cervicalgia: Secondary | ICD-10-CM | POA: Diagnosis not present

## 2018-05-15 DIAGNOSIS — J449 Chronic obstructive pulmonary disease, unspecified: Secondary | ICD-10-CM | POA: Diagnosis not present

## 2018-05-15 DIAGNOSIS — R296 Repeated falls: Secondary | ICD-10-CM | POA: Diagnosis not present

## 2018-05-15 DIAGNOSIS — F0390 Unspecified dementia without behavioral disturbance: Secondary | ICD-10-CM | POA: Diagnosis not present

## 2018-05-15 DIAGNOSIS — L89152 Pressure ulcer of sacral region, stage 2: Secondary | ICD-10-CM | POA: Diagnosis not present

## 2018-05-22 DIAGNOSIS — E46 Unspecified protein-calorie malnutrition: Secondary | ICD-10-CM | POA: Diagnosis not present

## 2018-05-22 DIAGNOSIS — I129 Hypertensive chronic kidney disease with stage 1 through stage 4 chronic kidney disease, or unspecified chronic kidney disease: Secondary | ICD-10-CM | POA: Diagnosis not present

## 2018-05-22 DIAGNOSIS — R471 Dysarthria and anarthria: Secondary | ICD-10-CM | POA: Diagnosis not present

## 2018-05-22 DIAGNOSIS — I4891 Unspecified atrial fibrillation: Secondary | ICD-10-CM | POA: Diagnosis not present

## 2018-05-22 DIAGNOSIS — R1312 Dysphagia, oropharyngeal phase: Secondary | ICD-10-CM | POA: Diagnosis not present

## 2018-05-22 DIAGNOSIS — F0280 Dementia in other diseases classified elsewhere without behavioral disturbance: Secondary | ICD-10-CM | POA: Diagnosis not present

## 2018-05-22 DIAGNOSIS — L89152 Pressure ulcer of sacral region, stage 2: Secondary | ICD-10-CM | POA: Diagnosis not present

## 2018-05-22 DIAGNOSIS — N183 Chronic kidney disease, stage 3 (moderate): Secondary | ICD-10-CM | POA: Diagnosis not present

## 2018-05-22 DIAGNOSIS — G3183 Dementia with Lewy bodies: Secondary | ICD-10-CM | POA: Diagnosis not present

## 2018-06-03 DIAGNOSIS — G3183 Dementia with Lewy bodies: Secondary | ICD-10-CM | POA: Diagnosis not present

## 2018-06-03 DIAGNOSIS — F0280 Dementia in other diseases classified elsewhere without behavioral disturbance: Secondary | ICD-10-CM | POA: Diagnosis not present

## 2018-06-03 DIAGNOSIS — N183 Chronic kidney disease, stage 3 (moderate): Secondary | ICD-10-CM | POA: Diagnosis not present

## 2018-06-03 DIAGNOSIS — E46 Unspecified protein-calorie malnutrition: Secondary | ICD-10-CM | POA: Diagnosis not present

## 2018-06-03 DIAGNOSIS — L89152 Pressure ulcer of sacral region, stage 2: Secondary | ICD-10-CM | POA: Diagnosis not present

## 2018-06-03 DIAGNOSIS — I4891 Unspecified atrial fibrillation: Secondary | ICD-10-CM | POA: Diagnosis not present

## 2018-06-03 DIAGNOSIS — I129 Hypertensive chronic kidney disease with stage 1 through stage 4 chronic kidney disease, or unspecified chronic kidney disease: Secondary | ICD-10-CM | POA: Diagnosis not present

## 2018-06-03 DIAGNOSIS — R1312 Dysphagia, oropharyngeal phase: Secondary | ICD-10-CM | POA: Diagnosis not present

## 2018-06-03 DIAGNOSIS — R471 Dysarthria and anarthria: Secondary | ICD-10-CM | POA: Diagnosis not present

## 2018-06-12 DIAGNOSIS — L89152 Pressure ulcer of sacral region, stage 2: Secondary | ICD-10-CM | POA: Diagnosis not present

## 2018-06-12 DIAGNOSIS — I4891 Unspecified atrial fibrillation: Secondary | ICD-10-CM | POA: Diagnosis not present

## 2018-06-12 DIAGNOSIS — N183 Chronic kidney disease, stage 3 (moderate): Secondary | ICD-10-CM | POA: Diagnosis not present

## 2018-06-12 DIAGNOSIS — R1312 Dysphagia, oropharyngeal phase: Secondary | ICD-10-CM | POA: Diagnosis not present

## 2018-06-12 DIAGNOSIS — I129 Hypertensive chronic kidney disease with stage 1 through stage 4 chronic kidney disease, or unspecified chronic kidney disease: Secondary | ICD-10-CM | POA: Diagnosis not present

## 2018-06-12 DIAGNOSIS — F0280 Dementia in other diseases classified elsewhere without behavioral disturbance: Secondary | ICD-10-CM | POA: Diagnosis not present

## 2018-06-12 DIAGNOSIS — R471 Dysarthria and anarthria: Secondary | ICD-10-CM | POA: Diagnosis not present

## 2018-06-12 DIAGNOSIS — E46 Unspecified protein-calorie malnutrition: Secondary | ICD-10-CM | POA: Diagnosis not present

## 2018-06-12 DIAGNOSIS — G3183 Dementia with Lewy bodies: Secondary | ICD-10-CM | POA: Diagnosis not present

## 2018-06-13 DIAGNOSIS — F0280 Dementia in other diseases classified elsewhere without behavioral disturbance: Secondary | ICD-10-CM | POA: Diagnosis not present

## 2018-06-13 DIAGNOSIS — I129 Hypertensive chronic kidney disease with stage 1 through stage 4 chronic kidney disease, or unspecified chronic kidney disease: Secondary | ICD-10-CM | POA: Diagnosis not present

## 2018-06-13 DIAGNOSIS — N183 Chronic kidney disease, stage 3 (moderate): Secondary | ICD-10-CM | POA: Diagnosis not present

## 2018-06-13 DIAGNOSIS — R471 Dysarthria and anarthria: Secondary | ICD-10-CM | POA: Diagnosis not present

## 2018-06-13 DIAGNOSIS — L89152 Pressure ulcer of sacral region, stage 2: Secondary | ICD-10-CM | POA: Diagnosis not present

## 2018-06-13 DIAGNOSIS — G3183 Dementia with Lewy bodies: Secondary | ICD-10-CM | POA: Diagnosis not present

## 2018-06-13 DIAGNOSIS — R1312 Dysphagia, oropharyngeal phase: Secondary | ICD-10-CM | POA: Diagnosis not present

## 2018-06-13 DIAGNOSIS — E46 Unspecified protein-calorie malnutrition: Secondary | ICD-10-CM | POA: Diagnosis not present

## 2018-06-13 DIAGNOSIS — I4891 Unspecified atrial fibrillation: Secondary | ICD-10-CM | POA: Diagnosis not present

## 2018-06-15 DIAGNOSIS — R296 Repeated falls: Secondary | ICD-10-CM | POA: Diagnosis not present

## 2018-06-15 DIAGNOSIS — R2681 Unsteadiness on feet: Secondary | ICD-10-CM | POA: Diagnosis not present

## 2018-06-15 DIAGNOSIS — F0281 Dementia in other diseases classified elsewhere with behavioral disturbance: Secondary | ICD-10-CM | POA: Diagnosis not present

## 2018-06-15 DIAGNOSIS — F0390 Unspecified dementia without behavioral disturbance: Secondary | ICD-10-CM | POA: Diagnosis not present

## 2018-06-15 DIAGNOSIS — L89152 Pressure ulcer of sacral region, stage 2: Secondary | ICD-10-CM | POA: Diagnosis not present

## 2018-06-15 DIAGNOSIS — G20 Parkinson's disease: Secondary | ICD-10-CM | POA: Diagnosis not present

## 2018-06-15 DIAGNOSIS — M542 Cervicalgia: Secondary | ICD-10-CM | POA: Diagnosis not present

## 2018-06-15 DIAGNOSIS — J449 Chronic obstructive pulmonary disease, unspecified: Secondary | ICD-10-CM | POA: Diagnosis not present

## 2018-06-15 DIAGNOSIS — F05 Delirium due to known physiological condition: Secondary | ICD-10-CM | POA: Diagnosis not present

## 2018-06-15 DIAGNOSIS — M129 Arthropathy, unspecified: Secondary | ICD-10-CM | POA: Diagnosis not present

## 2018-06-19 DIAGNOSIS — R1312 Dysphagia, oropharyngeal phase: Secondary | ICD-10-CM | POA: Diagnosis not present

## 2018-06-19 DIAGNOSIS — R471 Dysarthria and anarthria: Secondary | ICD-10-CM | POA: Diagnosis not present

## 2018-06-19 DIAGNOSIS — I129 Hypertensive chronic kidney disease with stage 1 through stage 4 chronic kidney disease, or unspecified chronic kidney disease: Secondary | ICD-10-CM | POA: Diagnosis not present

## 2018-06-19 DIAGNOSIS — E46 Unspecified protein-calorie malnutrition: Secondary | ICD-10-CM | POA: Diagnosis not present

## 2018-06-19 DIAGNOSIS — F0280 Dementia in other diseases classified elsewhere without behavioral disturbance: Secondary | ICD-10-CM | POA: Diagnosis not present

## 2018-06-19 DIAGNOSIS — L89152 Pressure ulcer of sacral region, stage 2: Secondary | ICD-10-CM | POA: Diagnosis not present

## 2018-06-19 DIAGNOSIS — G3183 Dementia with Lewy bodies: Secondary | ICD-10-CM | POA: Diagnosis not present

## 2018-06-19 DIAGNOSIS — I4891 Unspecified atrial fibrillation: Secondary | ICD-10-CM | POA: Diagnosis not present

## 2018-06-19 DIAGNOSIS — N183 Chronic kidney disease, stage 3 (moderate): Secondary | ICD-10-CM | POA: Diagnosis not present

## 2018-06-26 DIAGNOSIS — I129 Hypertensive chronic kidney disease with stage 1 through stage 4 chronic kidney disease, or unspecified chronic kidney disease: Secondary | ICD-10-CM | POA: Diagnosis not present

## 2018-06-26 DIAGNOSIS — R1312 Dysphagia, oropharyngeal phase: Secondary | ICD-10-CM | POA: Diagnosis not present

## 2018-06-26 DIAGNOSIS — L89152 Pressure ulcer of sacral region, stage 2: Secondary | ICD-10-CM | POA: Diagnosis not present

## 2018-06-26 DIAGNOSIS — R471 Dysarthria and anarthria: Secondary | ICD-10-CM | POA: Diagnosis not present

## 2018-06-26 DIAGNOSIS — G3183 Dementia with Lewy bodies: Secondary | ICD-10-CM | POA: Diagnosis not present

## 2018-06-26 DIAGNOSIS — I4891 Unspecified atrial fibrillation: Secondary | ICD-10-CM | POA: Diagnosis not present

## 2018-06-26 DIAGNOSIS — E46 Unspecified protein-calorie malnutrition: Secondary | ICD-10-CM | POA: Diagnosis not present

## 2018-06-26 DIAGNOSIS — F0280 Dementia in other diseases classified elsewhere without behavioral disturbance: Secondary | ICD-10-CM | POA: Diagnosis not present

## 2018-06-26 DIAGNOSIS — N183 Chronic kidney disease, stage 3 (moderate): Secondary | ICD-10-CM | POA: Diagnosis not present

## 2018-07-02 DIAGNOSIS — I4891 Unspecified atrial fibrillation: Secondary | ICD-10-CM | POA: Diagnosis not present

## 2018-07-02 DIAGNOSIS — F0280 Dementia in other diseases classified elsewhere without behavioral disturbance: Secondary | ICD-10-CM | POA: Diagnosis not present

## 2018-07-02 DIAGNOSIS — N183 Chronic kidney disease, stage 3 (moderate): Secondary | ICD-10-CM | POA: Diagnosis not present

## 2018-07-02 DIAGNOSIS — E46 Unspecified protein-calorie malnutrition: Secondary | ICD-10-CM | POA: Diagnosis not present

## 2018-07-02 DIAGNOSIS — R1312 Dysphagia, oropharyngeal phase: Secondary | ICD-10-CM | POA: Diagnosis not present

## 2018-07-02 DIAGNOSIS — I129 Hypertensive chronic kidney disease with stage 1 through stage 4 chronic kidney disease, or unspecified chronic kidney disease: Secondary | ICD-10-CM | POA: Diagnosis not present

## 2018-07-02 DIAGNOSIS — L89152 Pressure ulcer of sacral region, stage 2: Secondary | ICD-10-CM | POA: Diagnosis not present

## 2018-07-02 DIAGNOSIS — R471 Dysarthria and anarthria: Secondary | ICD-10-CM | POA: Diagnosis not present

## 2018-07-02 DIAGNOSIS — G3183 Dementia with Lewy bodies: Secondary | ICD-10-CM | POA: Diagnosis not present

## 2018-07-15 DIAGNOSIS — F0390 Unspecified dementia without behavioral disturbance: Secondary | ICD-10-CM | POA: Diagnosis not present

## 2018-07-15 DIAGNOSIS — G20 Parkinson's disease: Secondary | ICD-10-CM | POA: Diagnosis not present

## 2018-07-15 DIAGNOSIS — M542 Cervicalgia: Secondary | ICD-10-CM | POA: Diagnosis not present

## 2018-07-15 DIAGNOSIS — L89152 Pressure ulcer of sacral region, stage 2: Secondary | ICD-10-CM | POA: Diagnosis not present

## 2018-07-15 DIAGNOSIS — M129 Arthropathy, unspecified: Secondary | ICD-10-CM | POA: Diagnosis not present

## 2018-07-15 DIAGNOSIS — R2681 Unsteadiness on feet: Secondary | ICD-10-CM | POA: Diagnosis not present

## 2018-07-15 DIAGNOSIS — J449 Chronic obstructive pulmonary disease, unspecified: Secondary | ICD-10-CM | POA: Diagnosis not present

## 2018-07-15 DIAGNOSIS — F0281 Dementia in other diseases classified elsewhere with behavioral disturbance: Secondary | ICD-10-CM | POA: Diagnosis not present

## 2018-07-15 DIAGNOSIS — F05 Delirium due to known physiological condition: Secondary | ICD-10-CM | POA: Diagnosis not present

## 2018-07-15 DIAGNOSIS — R296 Repeated falls: Secondary | ICD-10-CM | POA: Diagnosis not present

## 2018-07-17 DIAGNOSIS — R1312 Dysphagia, oropharyngeal phase: Secondary | ICD-10-CM | POA: Diagnosis not present

## 2018-07-17 DIAGNOSIS — E46 Unspecified protein-calorie malnutrition: Secondary | ICD-10-CM | POA: Diagnosis not present

## 2018-07-17 DIAGNOSIS — G3183 Dementia with Lewy bodies: Secondary | ICD-10-CM | POA: Diagnosis not present

## 2018-07-17 DIAGNOSIS — I129 Hypertensive chronic kidney disease with stage 1 through stage 4 chronic kidney disease, or unspecified chronic kidney disease: Secondary | ICD-10-CM | POA: Diagnosis not present

## 2018-07-17 DIAGNOSIS — N183 Chronic kidney disease, stage 3 (moderate): Secondary | ICD-10-CM | POA: Diagnosis not present

## 2018-07-17 DIAGNOSIS — R471 Dysarthria and anarthria: Secondary | ICD-10-CM | POA: Diagnosis not present

## 2018-07-17 DIAGNOSIS — F0280 Dementia in other diseases classified elsewhere without behavioral disturbance: Secondary | ICD-10-CM | POA: Diagnosis not present

## 2018-07-17 DIAGNOSIS — I4891 Unspecified atrial fibrillation: Secondary | ICD-10-CM | POA: Diagnosis not present

## 2018-07-17 DIAGNOSIS — L89152 Pressure ulcer of sacral region, stage 2: Secondary | ICD-10-CM | POA: Diagnosis not present

## 2018-08-01 DIAGNOSIS — L89152 Pressure ulcer of sacral region, stage 2: Secondary | ICD-10-CM | POA: Diagnosis not present

## 2018-08-01 DIAGNOSIS — N183 Chronic kidney disease, stage 3 (moderate): Secondary | ICD-10-CM | POA: Diagnosis not present

## 2018-08-01 DIAGNOSIS — I129 Hypertensive chronic kidney disease with stage 1 through stage 4 chronic kidney disease, or unspecified chronic kidney disease: Secondary | ICD-10-CM | POA: Diagnosis not present

## 2018-08-01 DIAGNOSIS — I4891 Unspecified atrial fibrillation: Secondary | ICD-10-CM | POA: Diagnosis not present

## 2018-08-01 DIAGNOSIS — R471 Dysarthria and anarthria: Secondary | ICD-10-CM | POA: Diagnosis not present

## 2018-08-01 DIAGNOSIS — R1312 Dysphagia, oropharyngeal phase: Secondary | ICD-10-CM | POA: Diagnosis not present

## 2018-08-01 DIAGNOSIS — E46 Unspecified protein-calorie malnutrition: Secondary | ICD-10-CM | POA: Diagnosis not present

## 2018-08-01 DIAGNOSIS — F0280 Dementia in other diseases classified elsewhere without behavioral disturbance: Secondary | ICD-10-CM | POA: Diagnosis not present

## 2018-08-01 DIAGNOSIS — G3183 Dementia with Lewy bodies: Secondary | ICD-10-CM | POA: Diagnosis not present

## 2018-08-15 DIAGNOSIS — M542 Cervicalgia: Secondary | ICD-10-CM | POA: Diagnosis not present

## 2018-08-15 DIAGNOSIS — G20 Parkinson's disease: Secondary | ICD-10-CM | POA: Diagnosis not present

## 2018-08-15 DIAGNOSIS — L89152 Pressure ulcer of sacral region, stage 2: Secondary | ICD-10-CM | POA: Diagnosis not present

## 2018-08-15 DIAGNOSIS — M129 Arthropathy, unspecified: Secondary | ICD-10-CM | POA: Diagnosis not present

## 2018-08-15 DIAGNOSIS — J449 Chronic obstructive pulmonary disease, unspecified: Secondary | ICD-10-CM | POA: Diagnosis not present

## 2018-08-15 DIAGNOSIS — R2681 Unsteadiness on feet: Secondary | ICD-10-CM | POA: Diagnosis not present

## 2018-08-15 DIAGNOSIS — R296 Repeated falls: Secondary | ICD-10-CM | POA: Diagnosis not present

## 2018-08-15 DIAGNOSIS — F0281 Dementia in other diseases classified elsewhere with behavioral disturbance: Secondary | ICD-10-CM | POA: Diagnosis not present

## 2018-08-15 DIAGNOSIS — F0390 Unspecified dementia without behavioral disturbance: Secondary | ICD-10-CM | POA: Diagnosis not present

## 2018-08-15 DIAGNOSIS — F05 Delirium due to known physiological condition: Secondary | ICD-10-CM | POA: Diagnosis not present

## 2018-09-04 DIAGNOSIS — R21 Rash and other nonspecific skin eruption: Secondary | ICD-10-CM | POA: Diagnosis not present

## 2018-09-04 DIAGNOSIS — R269 Unspecified abnormalities of gait and mobility: Secondary | ICD-10-CM | POA: Diagnosis not present

## 2018-09-04 DIAGNOSIS — Z79899 Other long term (current) drug therapy: Secondary | ICD-10-CM | POA: Diagnosis not present

## 2018-09-04 DIAGNOSIS — F418 Other specified anxiety disorders: Secondary | ICD-10-CM | POA: Diagnosis not present

## 2018-09-04 DIAGNOSIS — H6120 Impacted cerumen, unspecified ear: Secondary | ICD-10-CM | POA: Diagnosis not present

## 2018-09-15 DIAGNOSIS — R296 Repeated falls: Secondary | ICD-10-CM | POA: Diagnosis not present

## 2018-09-15 DIAGNOSIS — M129 Arthropathy, unspecified: Secondary | ICD-10-CM | POA: Diagnosis not present

## 2018-09-15 DIAGNOSIS — M542 Cervicalgia: Secondary | ICD-10-CM | POA: Diagnosis not present

## 2018-09-15 DIAGNOSIS — F05 Delirium due to known physiological condition: Secondary | ICD-10-CM | POA: Diagnosis not present

## 2018-09-15 DIAGNOSIS — G20 Parkinson's disease: Secondary | ICD-10-CM | POA: Diagnosis not present

## 2018-09-15 DIAGNOSIS — L89152 Pressure ulcer of sacral region, stage 2: Secondary | ICD-10-CM | POA: Diagnosis not present

## 2018-09-15 DIAGNOSIS — J449 Chronic obstructive pulmonary disease, unspecified: Secondary | ICD-10-CM | POA: Diagnosis not present

## 2018-09-15 DIAGNOSIS — R2681 Unsteadiness on feet: Secondary | ICD-10-CM | POA: Diagnosis not present

## 2018-09-15 DIAGNOSIS — F0281 Dementia in other diseases classified elsewhere with behavioral disturbance: Secondary | ICD-10-CM | POA: Diagnosis not present

## 2018-09-15 DIAGNOSIS — F0390 Unspecified dementia without behavioral disturbance: Secondary | ICD-10-CM | POA: Diagnosis not present

## 2018-09-23 DIAGNOSIS — R05 Cough: Secondary | ICD-10-CM | POA: Diagnosis not present

## 2018-09-23 DIAGNOSIS — G4700 Insomnia, unspecified: Secondary | ICD-10-CM | POA: Diagnosis not present

## 2018-09-23 DIAGNOSIS — R079 Chest pain, unspecified: Secondary | ICD-10-CM | POA: Diagnosis not present

## 2018-09-28 IMAGING — DX DG CHEST 1V PORT
1 series · 1 of 1 positions shown · non-contrast
Comparison: Chest x-ray of October 10, 2017

CLINICAL DATA: Several days of productive cough, 1 week of
intermittent low-grade fever. Generalized weakness. History of
coronary artery disease, COPD, former smoker.

EXAM:
PORTABLE CHEST 1 VIEW

[chest ap]
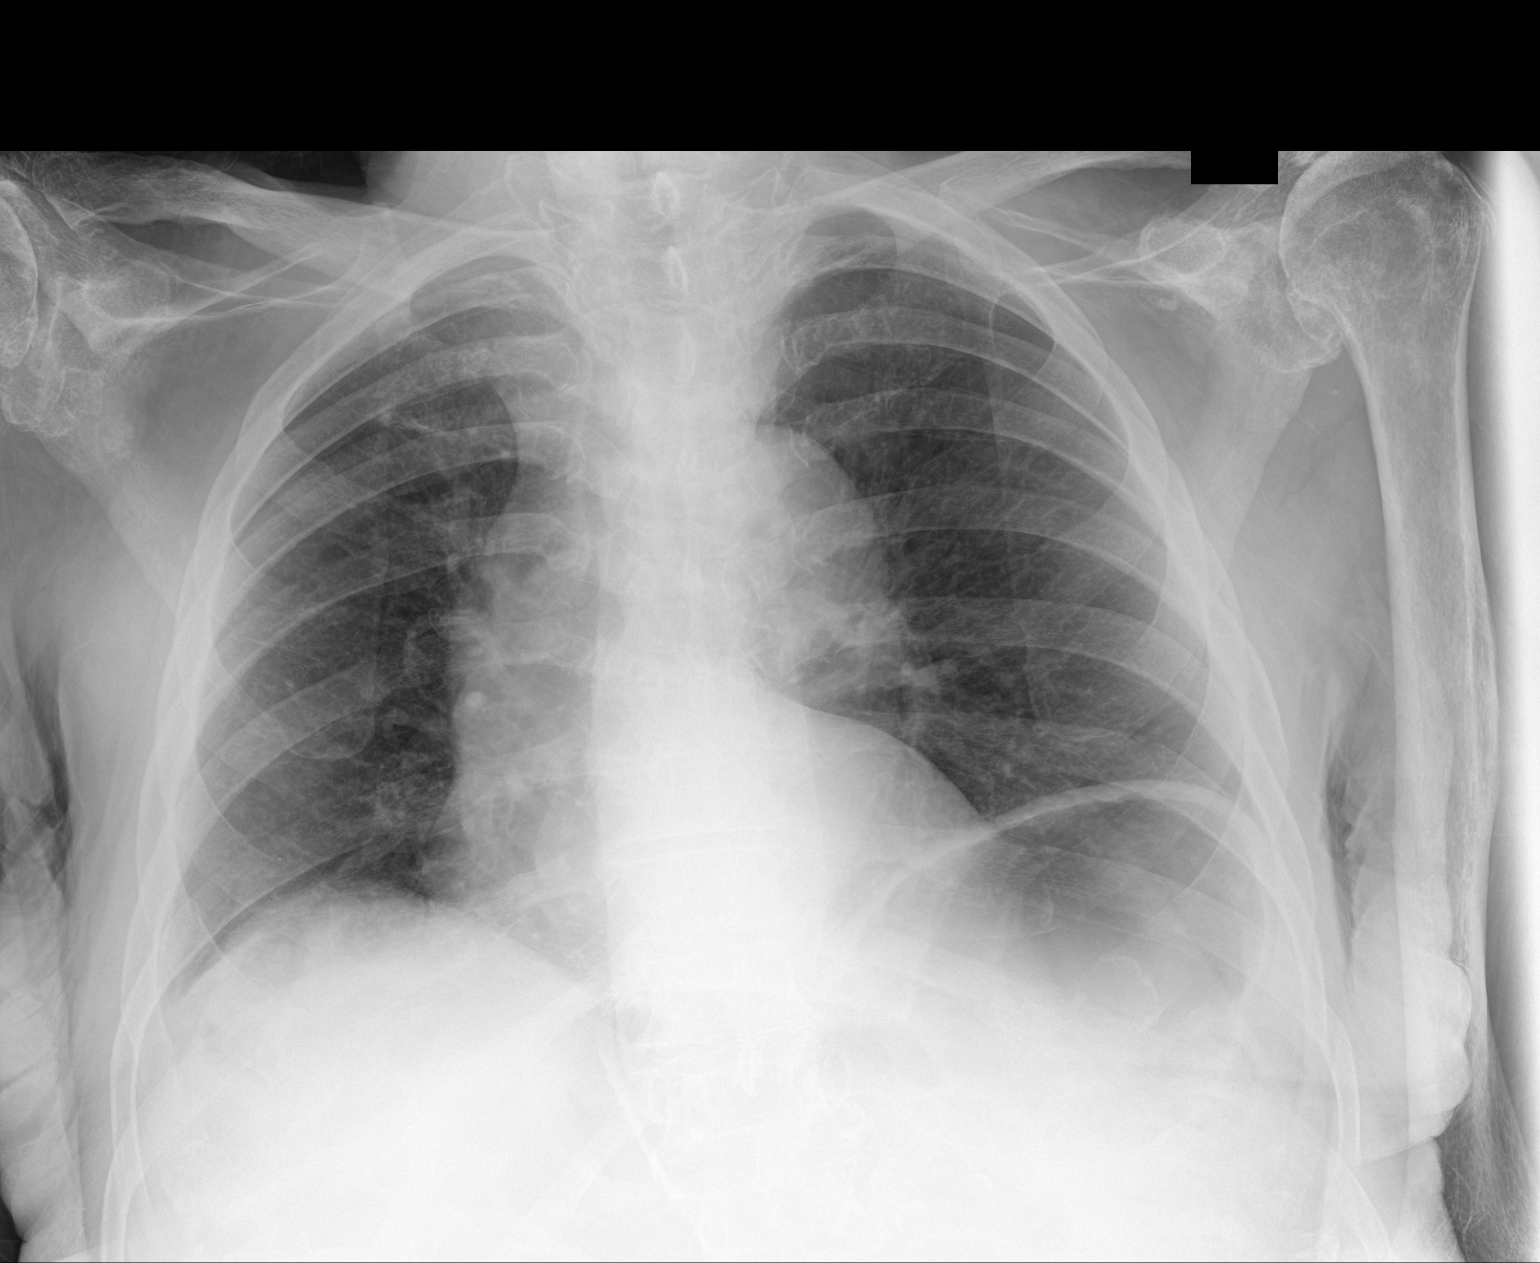

[1 of 1 positions shown; findings below may reference images not displayed]

FINDINGS: The lungs are mildly hypoinflated but clear. The heart is top-normal
in size. The pulmonary vascularity is normal. There is tortuosity of
the ascending and descending thoracic aorta. The trachea is midline.
The bony thorax exhibits no acute abnormality. There are
degenerative changes of both shoulders.
IMPRESSION: Mild hypoinflation.  No acute pneumonia nor CHF.

## 2018-10-07 DIAGNOSIS — H04209 Unspecified epiphora, unspecified lacrimal gland: Secondary | ICD-10-CM | POA: Diagnosis not present

## 2018-10-07 DIAGNOSIS — H6123 Impacted cerumen, bilateral: Secondary | ICD-10-CM | POA: Diagnosis not present

## 2018-10-07 DIAGNOSIS — H903 Sensorineural hearing loss, bilateral: Secondary | ICD-10-CM | POA: Diagnosis not present

## 2018-10-15 DIAGNOSIS — M542 Cervicalgia: Secondary | ICD-10-CM | POA: Diagnosis not present

## 2018-10-15 DIAGNOSIS — J449 Chronic obstructive pulmonary disease, unspecified: Secondary | ICD-10-CM | POA: Diagnosis not present

## 2018-10-15 DIAGNOSIS — F05 Delirium due to known physiological condition: Secondary | ICD-10-CM | POA: Diagnosis not present

## 2018-10-15 DIAGNOSIS — F0390 Unspecified dementia without behavioral disturbance: Secondary | ICD-10-CM | POA: Diagnosis not present

## 2018-10-15 DIAGNOSIS — R2681 Unsteadiness on feet: Secondary | ICD-10-CM | POA: Diagnosis not present

## 2018-10-15 DIAGNOSIS — R296 Repeated falls: Secondary | ICD-10-CM | POA: Diagnosis not present

## 2018-10-15 DIAGNOSIS — M129 Arthropathy, unspecified: Secondary | ICD-10-CM | POA: Diagnosis not present

## 2018-10-15 DIAGNOSIS — F0281 Dementia in other diseases classified elsewhere with behavioral disturbance: Secondary | ICD-10-CM | POA: Diagnosis not present

## 2018-10-15 DIAGNOSIS — L89152 Pressure ulcer of sacral region, stage 2: Secondary | ICD-10-CM | POA: Diagnosis not present

## 2018-10-15 DIAGNOSIS — G20 Parkinson's disease: Secondary | ICD-10-CM | POA: Diagnosis not present

## 2018-10-27 DIAGNOSIS — G4700 Insomnia, unspecified: Secondary | ICD-10-CM | POA: Diagnosis not present

## 2018-10-27 DIAGNOSIS — R079 Chest pain, unspecified: Secondary | ICD-10-CM | POA: Diagnosis not present

## 2018-10-27 DIAGNOSIS — R05 Cough: Secondary | ICD-10-CM | POA: Diagnosis not present

## 2018-11-01 ENCOUNTER — Emergency Department (HOSPITAL_COMMUNITY): Payer: Medicare HMO

## 2018-11-01 ENCOUNTER — Encounter (HOSPITAL_COMMUNITY): Payer: Self-pay | Admitting: Emergency Medicine

## 2018-11-01 ENCOUNTER — Emergency Department (HOSPITAL_COMMUNITY)
Admission: EM | Admit: 2018-11-01 | Discharge: 2018-11-01 | Disposition: A | Discharge status: Home / Self Care | Payer: Medicare HMO | Attending: Emergency Medicine | Admitting: Emergency Medicine

## 2018-11-01 DIAGNOSIS — Z8673 Personal history of transient ischemic attack (TIA), and cerebral infarction without residual deficits: Secondary | ICD-10-CM | POA: Diagnosis not present

## 2018-11-01 DIAGNOSIS — R0602 Shortness of breath: Secondary | ICD-10-CM

## 2018-11-01 DIAGNOSIS — R001 Bradycardia, unspecified: Secondary | ICD-10-CM | POA: Diagnosis not present

## 2018-11-01 DIAGNOSIS — Z9981 Dependence on supplemental oxygen: Secondary | ICD-10-CM | POA: Diagnosis not present

## 2018-11-01 DIAGNOSIS — I10 Essential (primary) hypertension: Secondary | ICD-10-CM | POA: Insufficient documentation

## 2018-11-01 DIAGNOSIS — R05 Cough: Secondary | ICD-10-CM | POA: Diagnosis not present

## 2018-11-01 DIAGNOSIS — Z85828 Personal history of other malignant neoplasm of skin: Secondary | ICD-10-CM | POA: Insufficient documentation

## 2018-11-01 DIAGNOSIS — G20 Parkinson's disease: Secondary | ICD-10-CM | POA: Insufficient documentation

## 2018-11-01 DIAGNOSIS — Z87891 Personal history of nicotine dependence: Secondary | ICD-10-CM | POA: Diagnosis not present

## 2018-11-01 DIAGNOSIS — R9431 Abnormal electrocardiogram [ECG] [EKG]: Secondary | ICD-10-CM | POA: Diagnosis not present

## 2018-11-01 DIAGNOSIS — I2510 Atherosclerotic heart disease of native coronary artery without angina pectoris: Secondary | ICD-10-CM | POA: Insufficient documentation

## 2018-11-01 LAB — BASIC METABOLIC PANEL
Anion gap: 3 — ABNORMAL LOW (ref 5–15)
BUN: 21 mg/dL (ref 8–23)
CHLORIDE: 109 mmol/L (ref 98–111)
CO2: 26 mmol/L (ref 22–32)
CREATININE: 1.18 mg/dL (ref 0.61–1.24)
Calcium: 8.8 mg/dL — ABNORMAL LOW (ref 8.9–10.3)
GFR calc non Af Amer: 53 mL/min — ABNORMAL LOW (ref >60)
Glucose, Bld: 91 mg/dL (ref 70–99)
Potassium: 4.4 mmol/L (ref 3.5–5.1)
Sodium: 138 mmol/L (ref 135–145)

## 2018-11-01 LAB — CBC WITH DIFFERENTIAL/PLATELET
Abs Immature Granulocytes: 0.02 10*3/uL (ref 0.00–0.07)
BASOS ABS: 0 10*3/uL (ref 0.0–0.1)
Basophils Relative: 0 %
EOS PCT: 3 %
Eosinophils Absolute: 0.2 10*3/uL (ref 0.0–0.5)
HEMATOCRIT: 45.6 % (ref 39.0–52.0)
HEMOGLOBIN: 15.1 g/dL (ref 13.0–17.0)
Immature Granulocytes: 0 %
LYMPHS ABS: 2.4 10*3/uL (ref 0.7–4.0)
LYMPHS PCT: 34 %
MCH: 28.6 pg (ref 26.0–34.0)
MCHC: 33.1 g/dL (ref 30.0–36.0)
MCV: 86.4 fL (ref 80.0–100.0)
MONO ABS: 0.4 10*3/uL (ref 0.1–1.0)
MONOS PCT: 6 %
Neutro Abs: 4.1 10*3/uL (ref 1.7–7.7)
Neutrophils Relative %: 57 %
Platelets: 211 10*3/uL (ref 150–400)
RBC: 5.28 MIL/uL (ref 4.22–5.81)
RDW: 15.2 % (ref 11.5–15.5)
WBC: 7.2 10*3/uL (ref 4.0–10.5)
nRBC: 0 % (ref 0.0–0.2)

## 2018-11-01 LAB — I-STAT TROPONIN, ED: Troponin i, poc: 0 ng/mL (ref 0.00–0.08)

## 2018-11-01 MED ORDER — IPRATROPIUM-ALBUTEROL 0.5-2.5 (3) MG/3ML IN SOLN
3.0000 mL | Freq: Once | RESPIRATORY_TRACT | Status: AC
  Administered 2018-11-01: 3 mL via RESPIRATORY_TRACT
  Filled 2018-11-01: qty 3

## 2018-11-01 NOTE — ED Provider Notes (Signed)
TIME SEEN: 5:43 AM  CHIEF COMPLAINT: Shortness of breath  HPI: Patient is an 84 year old male with history of atrial fibrillation, COPD, Parkinson's, stroke who presents to the emergency department with EMS for shortness of breath, cough for the past several days.  Reports productive cough.  Unknown if he has had any fever.  Denies any chest pain or chest discomfort.  Wears 2 L of oxygen at home.  No history of CHF.  Echocardiogram in January 2018 showed EF of 60 to 65% without acute abnormality.  ROS: See HPI Constitutional: no fever  Eyes: no drainage  ENT: no runny nose   Cardiovascular:  no chest pain  Resp:  SOB  GI: no vomiting GU: no dysuria Integumentary: no rash  Allergy: no hives  Musculoskeletal: no leg swelling  Neurological: no slurred speech ROS otherwise negative  PAST MEDICAL HISTORY/PAST SURGICAL HISTORY:  Past Medical History:  Diagnosis Date  . Anxiety   . Arthritis    "bad in his knees" (01/09/2017)  . Benign prostatic hypertrophy    hx  . Chronic airway obstruction, not elsewhere classified   . Chronic bronchitis ([place])   . Coronary atherosclerosis of unspecified type of vessel, native or graft   . GERD (gastroesophageal reflux disease)   . New onset atrial fibrillation ([place]) 01/08/2017   [name] 01/08/2017  . On home oxygen therapy    "prn" (01/09/2017)  . Osteoarthrosis, unspecified whether generalized or localized, unspecified site   . Parkinson's disease ([place])    with dementia and psychosis.   . Pneumonia    "more than once" (01/09/2017)  . Pure hypercholesterolemia    IIA  . Skin cancer of face   . Stroke [facility])    "they think he had a minor stroke 1-2 yr ago" (01/09/2017)  . Unspecified essential hypertension     MEDICATIONS:  Prior to Admission medications   Medication Sig Start Date End Date Taking? Authorizing Provider  feeding supplement, ENSURE ENLIVE, (ENSURE ENLIVE) LIQD Take 237 mLs by mouth 2 (two) times daily between meals. 10/12/17    Rai, Vernelle Emerald, MD  isosorbide mononitrate (IMDUR) 30 MG 24 hr tablet Take 0.5 tablets (15 mg total) by mouth daily. 09/13/16   Ghimire, Henreitta Leber, MD  naphazoline-glycerin (CLEAR EYES) 0.012-0.2 % SOLN Place 1-2 drops into both eyes 4 (four) times daily as needed for eye irritation. 10/09/17   Rai, Ripudeep Raliegh Ip, MD  tamsulosin (FLOMAX) 0.4 MG CAPS Take 0.4 mg by mouth daily.    [provider]    ALLERGIES:  Allergies  Allergen Reactions  . Bee Venom Anaphylaxis    SOCIAL HISTORY:  Social History   Tobacco Use  . Smoking status: Former Smoker    Years: 40.00    Types: Cigarettes    Last attempt to quit: 1979    Years since quitting: 40.8  . Smokeless tobacco: Never Used  Substance Use Topics  . Alcohol use: Not Currently    FAMILY HISTORY: Family History  Problem Relation Age of Onset  . Stroke Mother   . Stroke Father   . Diabetes Brother   . Other Daughter        adopted    EXAM: BP (!) 188/83   Temp 97.7 F (36.5 C) (Rectal)   SpO2 98% HR 64 CONSTITUTIONAL: Alert and oriented and responds appropriately to questions; chronically ill-appearing, in no distress HEAD: Normocephalic EYES: Conjunctivae clear, pupils appear equal, EOMI ENT: normal nose; moist mucous membranes NECK: Supple, no meningismus, no nuchal  rigidity, no LAD  CARD: RRR; S1 and S2 appreciated; no murmurs, no clicks, no rubs, no gallops RESP: Normal chest excursion without splinting or tachypnea; breath sounds clear and equal bilaterally; no wheezes, no rhonchi, no rales, no hypoxia or respiratory distress, speaking full sentences ABD/GI: Normal bowel sounds; non-distended; soft, non-tender, no rebound, no guarding, no peritoneal signs, no hepatosplenomegaly BACK:  The back appears normal and is non-tender to palpation, there is no CVA tenderness EXT: Normal ROM in all joints; non-tender to palpation; no edema; normal capillary refill; no cyanosis, no calf tenderness or swelling    SKIN:  Normal color for age and race; warm; no rash NEURO: Moves all extremities equally PSYCH: The patient's mood and manner are appropriate. Grooming and personal hygiene are appropriate.  MEDICAL DECISION MAKING: Patient here with shortness of breath.  Normal rectal temperature.  Lungs are currently clear.  He is on oxygen chronically at home.  No hypoxia here on room air.  Denies any chest pain.  This may be secondary to viral illness.  Will obtain labs, chest x-ray.  EKG shows no ischemic abnormality.  Does not currently appear volume overloaded.  Doubt pulmonary embolus, dissection, ACS.  ED PROGRESS: Patient's labs unremarkable.  No leukocytosis.  Negative troponin.  Chest x-ray is pending.  Patient's son is now at bedside he states over the past several days he has had a cough and woke up this morning and said he felt short of breath.  Discussed plan that patient would likely be able to be discharged home as long as he remains hemodynamically stable with clear lungs.  Patient's son is comfortable with this plan.  Signed out to Dr. Ralene Bathe will follow up on patient's chest x-ray.   I reviewed all nursing notes, vitals, pertinent previous records, EKGs, lab and urine results, imaging (as available).      EKG Interpretation  Date/Time:  Saturday November 01 2018 05:33:09 EDT Ventricular Rate:  64 PR Interval:    QRS Duration: 101 QT Interval:  410 QTC Calculation: 423 R Axis:   28 Text Interpretation:  Normal sinus rhythm Borderline low voltage, extremity leads Abnormal R-wave progression, early transition Consider anterior infarct Artifact Confirmed by Pryor Curia [phone]) on 11/01/2018 5:43:43 AM         Ward, Delice Bison, DO 11/01/18 1561

## 2018-11-01 NOTE — ED Provider Notes (Signed)
Patient care assumed at 0700.  Pt with hx/o dementia, on home oxygen here for sob.  On eval pt is asymptomatic with clear lungs.  He wears his oxygen prn at home and did not use it last night.  No evidence of pna.  Presentation not c/w PE, CHF, ACS.  Plan to d/c home with outpatient follow up and return precautions.     Quintella Reichert, MD 11/01/18 0800

## 2018-11-01 NOTE — ED Triage Notes (Signed)
BIB [facility] from home with c/o of SOB X 2days with productive cough. Pt on 2L 02 @ home. Hx of COPD and Dementia.

## 2018-11-01 NOTE — ED Notes (Signed)
Patient verbalizes understanding of discharge instructions. Opportunity for questioning and answers were provided. Pt discharged from ED. 

## 2018-11-09 ENCOUNTER — Emergency Department (HOSPITAL_COMMUNITY): Payer: Medicare HMO

## 2018-11-09 ENCOUNTER — Emergency Department (HOSPITAL_COMMUNITY)
Admission: EM | Admit: 2018-11-09 | Discharge: 2018-11-09 | Disposition: A | Discharge status: Home / Self Care | Payer: Medicare HMO | Attending: Emergency Medicine | Admitting: Emergency Medicine

## 2018-11-09 ENCOUNTER — Encounter (HOSPITAL_COMMUNITY): Payer: Self-pay | Admitting: Emergency Medicine

## 2018-11-09 ENCOUNTER — Other Ambulatory Visit: Payer: Self-pay

## 2018-11-09 DIAGNOSIS — J189 Pneumonia, unspecified organism: Secondary | ICD-10-CM | POA: Diagnosis not present

## 2018-11-09 DIAGNOSIS — F0390 Unspecified dementia without behavioral disturbance: Secondary | ICD-10-CM | POA: Diagnosis not present

## 2018-11-09 DIAGNOSIS — R05 Cough: Secondary | ICD-10-CM | POA: Diagnosis not present

## 2018-11-09 DIAGNOSIS — I10 Essential (primary) hypertension: Secondary | ICD-10-CM | POA: Insufficient documentation

## 2018-11-09 DIAGNOSIS — I2510 Atherosclerotic heart disease of native coronary artery without angina pectoris: Secondary | ICD-10-CM | POA: Diagnosis not present

## 2018-11-09 DIAGNOSIS — G20 Parkinson's disease: Secondary | ICD-10-CM | POA: Insufficient documentation

## 2018-11-09 DIAGNOSIS — R0602 Shortness of breath: Secondary | ICD-10-CM | POA: Diagnosis not present

## 2018-11-09 DIAGNOSIS — J449 Chronic obstructive pulmonary disease, unspecified: Secondary | ICD-10-CM | POA: Insufficient documentation

## 2018-11-09 DIAGNOSIS — Z79899 Other long term (current) drug therapy: Secondary | ICD-10-CM | POA: Insufficient documentation

## 2018-11-09 DIAGNOSIS — Z87891 Personal history of nicotine dependence: Secondary | ICD-10-CM | POA: Diagnosis not present

## 2018-11-09 DIAGNOSIS — R059 Cough, unspecified: Secondary | ICD-10-CM

## 2018-11-09 LAB — COMPREHENSIVE METABOLIC PANEL
ALK PHOS: 33 U/L — AB (ref 38–126)
ALT: 8 U/L (ref 0–44)
AST: 12 U/L — ABNORMAL LOW (ref 15–41)
Albumin: 3.2 g/dL — ABNORMAL LOW (ref 3.5–5.0)
Anion gap: 8 (ref 5–15)
BUN: 24 mg/dL — AB (ref 8–23)
CO2: 26 mmol/L (ref 22–32)
Calcium: 8.8 mg/dL — ABNORMAL LOW (ref 8.9–10.3)
Chloride: 103 mmol/L (ref 98–111)
Creatinine, Ser: 1.45 mg/dL — ABNORMAL HIGH (ref 0.61–1.24)
GFR, EST AFRICAN AMERICAN: 48 mL/min — AB (ref >60)
GFR, EST NON AFRICAN AMERICAN: 41 mL/min — AB (ref >60)
Glucose, Bld: 92 mg/dL (ref 70–99)
Potassium: 4.3 mmol/L (ref 3.5–5.1)
SODIUM: 137 mmol/L (ref 135–145)
TOTAL PROTEIN: 6.1 g/dL — AB (ref 6.5–8.1)
Total Bilirubin: 0.9 mg/dL (ref 0.3–1.2)

## 2018-11-09 LAB — CBC WITH DIFFERENTIAL/PLATELET
ABS IMMATURE GRANULOCYTES: 0.03 10*3/uL (ref 0.00–0.07)
BASOS PCT: 0 %
Basophils Absolute: 0 10*3/uL (ref 0.0–0.1)
Eosinophils Absolute: 0.3 10*3/uL (ref 0.0–0.5)
Eosinophils Relative: 4 %
HCT: 46.7 % (ref 39.0–52.0)
Hemoglobin: 15.3 g/dL (ref 13.0–17.0)
IMMATURE GRANULOCYTES: 0 %
Lymphocytes Relative: 36 %
Lymphs Abs: 2.4 10*3/uL (ref 0.7–4.0)
MCH: 28.5 pg (ref 26.0–34.0)
MCHC: 32.8 g/dL (ref 30.0–36.0)
MCV: 87 fL (ref 80.0–100.0)
MONOS PCT: 7 %
Monocytes Absolute: 0.5 10*3/uL (ref 0.1–1.0)
NEUTROS ABS: 3.6 10*3/uL (ref 1.7–7.7)
NEUTROS PCT: 53 %
Platelets: 204 10*3/uL (ref 150–400)
RBC: 5.37 MIL/uL (ref 4.22–5.81)
RDW: 14.8 % (ref 11.5–15.5)
WBC: 6.8 10*3/uL (ref 4.0–10.5)
nRBC: 0 % (ref 0.0–0.2)

## 2018-11-09 LAB — I-STAT CG4 LACTIC ACID, ED: LACTIC ACID, VENOUS: 1.31 mmol/L (ref 0.5–1.9)

## 2018-11-09 MED ORDER — SODIUM CHLORIDE 0.9 % IV BOLUS
500.0000 mL | Freq: Once | INTRAVENOUS | Status: AC
  Administered 2018-11-09: 500 mL via INTRAVENOUS

## 2018-11-09 NOTE — ED Provider Notes (Signed)
[facility] EMERGENCY DEPARTMENT Provider Note   CSN: [id number] Arrival date & time: 11/09/18  0533     History   Chief Complaint Chief Complaint  Patient presents with  . Shortness of Breath    HPI Terry Taylor is a 84 y.o. male.  84yo M w/ PMH including CAD, COPD, Parkinson's, dementia who p/w possible pneumonia.  EMS reports that the family called them out to the house for shortness of breath and concern for possible pneumonia.  They told EMS that he has had a recent "infection" but is not currently on antibiotics.  Per chart review, he is on home oxygen. PT states he is intermittently SOB but that is usual for him. He denies any pain.  LEVEL 5 CAVEAT DUE TO DEMENTIA  The history is provided by the EMS personnel.  Shortness of Breath     Past Medical History:  Diagnosis Date  . Anxiety   . Arthritis    "bad in his knees" (01/09/2017)  . Benign prostatic hypertrophy    hx  . Chronic airway obstruction, not elsewhere classified   . Chronic bronchitis ([place])   . Coronary atherosclerosis of unspecified type of vessel, native or graft   . GERD (gastroesophageal reflux disease)   . New onset atrial fibrillation ([place]) 01/08/2017   [name] 01/08/2017  . On home oxygen therapy    "prn" (01/09/2017)  . Osteoarthrosis, unspecified whether generalized or localized, unspecified site   . Parkinson's disease ([place])    with dementia and psychosis.   . Pneumonia    "more than once" (01/09/2017)  . Pure hypercholesterolemia    IIA  . Skin cancer of face   . Stroke [facility])    "they think he had a minor stroke 1-2 yr ago" (01/09/2017)  . Unspecified essential hypertension     Patient Active Problem List   Diagnosis Date Noted  . Goals of care, counseling/discussion   . Palliative care encounter   . Pressure injury of skin 10/09/2017  . Polypharmacy   . Supplemental oxygen dependent   . PAF (paroxysmal atrial fibrillation) ([place])   . Benign essential HTN     . Parkinson disease ([place])   . Hypoalbuminemia due to protein-calorie malnutrition ([place])   . Overdose 10/04/2017  . Altered mental status 10/04/2017  . UTI (urinary tract infection) 10/04/2017  . Anemia 10/04/2017  . Renal insufficiency 10/04/2017  . Paroxysmal A-fib ([place]) 01/11/2017  . Influenza A 01/09/2017  . COPD (chronic obstructive pulmonary disease) ([place]) 01/08/2017  . Dementia ([place]) 09/13/2016  . Chest pain, rule out acute myocardial infarction 09/12/2016  . Fever 03/07/2013  . Acute encephalopathy 03/07/2013  . Weakness generalized 03/07/2013  . HYPERCHOLESTEROLEMIA  IIA 04/09/2009  . Essential hypertension 04/09/2009  . Coronary artery disease due to lipid rich plaque 04/09/2009  . COPD exacerbation ([place]) 04/09/2009  . OSTEOARTHRITIS 04/09/2009  . BENIGN PROSTATIC HYPERTROPHY, HX OF 04/09/2009    Past Surgical History:  Procedure Laterality Date  . CERVICAL DISCECTOMY    . FRACTURE SURGERY    . SKIN CANCER DESTRUCTION Right    "face"  . TIBIA FRACTURE SURGERY Right    "he's got a pin in there"        Home Medications    Prior to Admission medications   Medication Sig Start Date End Date Taking? Authorizing Provider  carbidopa-levodopa (SINEMET IR) 25-100 MG tablet Take 1 tablet by mouth 2 (two) times daily. 10/10/18   [provider]  feeding supplement,  ENSURE ENLIVE, (ENSURE ENLIVE) LIQD Take 237 mLs by mouth 2 (two) times daily between meals. Patient not taking: Reported on 11/01/2018 10/12/17   Rai, Vernelle Emerald, MD  isosorbide mononitrate (IMDUR) 30 MG 24 hr tablet Take 0.5 tablets (15 mg total) by mouth daily. 09/13/16   Ghimire, Henreitta Leber, MD  naphazoline-glycerin (CLEAR EYES) 0.012-0.2 % SOLN Place 1-2 drops into both eyes 4 (four) times daily as needed for eye irritation. Patient not taking: Reported on 11/01/2018 10/09/17   Rai, Vernelle Emerald, MD  ramelteon (ROZEREM) 8 MG tablet Take 8 mg by mouth at bedtime. 09/25/18   [provider]   tamsulosin (FLOMAX) 0.4 MG CAPS Take 0.4 mg by mouth daily.    [provider]    Family History Family History  Problem Relation Age of Onset  . Stroke Mother   . Stroke Father   . Diabetes Brother   . Other Daughter        adopted    Social History Social History   Tobacco Use  . Smoking status: Former Smoker    Years: 40.00    Types: Cigarettes    Last attempt to quit: 1979    Years since quitting: 40.8  . Smokeless tobacco: Never Used  Substance Use Topics  . Alcohol use: Not Currently  . Drug use: No     Allergies   Bee venom   Review of Systems Review of Systems  Unable to perform ROS: Dementia  Respiratory: Positive for shortness of breath.      Physical Exam Updated Vital Signs BP (!) 163/83   Pulse 62   Temp 97.8 F (36.6 C) (Rectal)   Resp 14   Ht 5\' 10"  (1.778 m)   Wt 67.4 kg   SpO2 (!) 87%   BMI 21.32 kg/m   Physical Exam  Constitutional: He appears well-developed. No distress.  Frail, chronically ill appearing, awake, comfortable  HENT:  Head: Normocephalic and atraumatic.  Dry mouth  Eyes: Pupils are equal, round, and reactive to light. Conjunctivae are normal.  Everted lower L eyelid  Neck: Neck supple.  Cardiovascular: Normal rate, regular rhythm and normal heart sounds.  No murmur heard. Pulmonary/Chest: Effort normal. He has no wheezes. He has no rales.  Rhonchi b/l  Abdominal: Soft. Bowel sounds are normal. He exhibits no distension. There is no tenderness.  Musculoskeletal: He exhibits no edema.  Neurological: He is alert.  Oriented to person, no facial asymmetry  Skin: Skin is warm and dry.  Decubitus ulcer with clean bandage in place, no drainage  Psychiatric: Judgment normal.  Nursing note and vitals reviewed.    ED Treatments / Results  Labs (all labs ordered are listed, but only abnormal results are displayed) Labs Reviewed  COMPREHENSIVE METABOLIC PANEL - Abnormal; Notable for the following  components:      Result Value   BUN 24 (*)    Creatinine, Ser 1.45 (*)    Calcium 8.8 (*)    Total Protein 6.1 (*)    Albumin 3.2 (*)    AST 12 (*)    Alkaline Phosphatase 33 (*)    GFR calc non Af Amer 41 (*)    GFR calc Af Amer 48 (*)    All other components within normal limits  CBC WITH DIFFERENTIAL/PLATELET  I-STAT CG4 LACTIC ACID, ED  I-STAT CG4 LACTIC ACID, ED    EKG None  Radiology Dg Chest 2 View  Result Date: 11/09/2018 CLINICAL DATA:  Patient with shortness of  breath. EXAM: CHEST - 2 VIEW COMPARISON:  Chest radiograph 11/01/2018 FINDINGS: Monitoring leads overlie the patient. Stable cardiac and mediastinal contours. Tortuosity of the thoracic aorta. No consolidative pulmonary opacities. No pleural effusion or pneumothorax. Thoracic spine degenerative changes. IMPRESSION: No acute cardiopulmonary process. Electronically Signed   By: Lovey Newcomer M.D.   On: 11/09/2018 07:36    Procedures Procedures (including critical care time)  Medications Ordered in ED Medications  sodium chloride 0.9 % bolus 500 mL (0 mLs Intravenous Stopped 11/09/18 0748)     Initial Impression / Assessment and Plan / ED Course  I have reviewed the triage vital signs and the nursing notes.  Pertinent labs & imaging results that were available during my care of the patient were reviewed by me and considered in my medical decision making (see chart for details).    Pt was awake and comfortable on exam, afebrile, normal O2 sat on RA, normal WOB. CXR clear. Labs show Cr 1.45, slightly above previous, gave IVF bolus. CBC normal.  Given clear chest x-ray, normal CBC, and well appearance, I do not feel he needs antibiotics at this time.  I have discussed this with the patient's family including son over the phone and daughter who is present with him here.  I have recommended close follow-up with PCP for reassessment.  Return precautions reviewed.  They voiced understanding.  Final Clinical  Impressions(s) / ED Diagnoses   Final diagnoses:  Cough    ED Discharge Orders    None       Rondle Lohse, Wenda Overland, MD 11/09/18 [id number]

## 2018-11-09 NOTE — ED Triage Notes (Signed)
Per ems pt's family called them out for SOB and possible pneumonia due to recent infection. Pt hx of COPD and dementia

## 2018-11-09 NOTE — ED Notes (Signed)
Family at bedside. 

## 2018-11-15 DIAGNOSIS — F05 Delirium due to known physiological condition: Secondary | ICD-10-CM | POA: Diagnosis not present

## 2018-11-15 DIAGNOSIS — M542 Cervicalgia: Secondary | ICD-10-CM | POA: Diagnosis not present

## 2018-11-15 DIAGNOSIS — F0390 Unspecified dementia without behavioral disturbance: Secondary | ICD-10-CM | POA: Diagnosis not present

## 2018-11-15 DIAGNOSIS — G20 Parkinson's disease: Secondary | ICD-10-CM | POA: Diagnosis not present

## 2018-11-15 DIAGNOSIS — F0281 Dementia in other diseases classified elsewhere with behavioral disturbance: Secondary | ICD-10-CM | POA: Diagnosis not present

## 2018-11-15 DIAGNOSIS — J449 Chronic obstructive pulmonary disease, unspecified: Secondary | ICD-10-CM | POA: Diagnosis not present

## 2018-11-15 DIAGNOSIS — R296 Repeated falls: Secondary | ICD-10-CM | POA: Diagnosis not present

## 2018-11-15 DIAGNOSIS — L89152 Pressure ulcer of sacral region, stage 2: Secondary | ICD-10-CM | POA: Diagnosis not present

## 2018-11-15 DIAGNOSIS — R2681 Unsteadiness on feet: Secondary | ICD-10-CM | POA: Diagnosis not present

## 2018-11-15 DIAGNOSIS — M129 Arthropathy, unspecified: Secondary | ICD-10-CM | POA: Diagnosis not present

## 2018-11-25 DIAGNOSIS — R05 Cough: Secondary | ICD-10-CM | POA: Diagnosis not present

## 2018-11-25 DIAGNOSIS — R079 Chest pain, unspecified: Secondary | ICD-10-CM | POA: Diagnosis not present

## 2018-11-25 DIAGNOSIS — G4700 Insomnia, unspecified: Secondary | ICD-10-CM | POA: Diagnosis not present

## 2018-12-15 DIAGNOSIS — F0281 Dementia in other diseases classified elsewhere with behavioral disturbance: Secondary | ICD-10-CM | POA: Diagnosis not present

## 2018-12-15 DIAGNOSIS — M542 Cervicalgia: Secondary | ICD-10-CM | POA: Diagnosis not present

## 2018-12-15 DIAGNOSIS — G20 Parkinson's disease: Secondary | ICD-10-CM | POA: Diagnosis not present

## 2018-12-15 DIAGNOSIS — J449 Chronic obstructive pulmonary disease, unspecified: Secondary | ICD-10-CM | POA: Diagnosis not present

## 2018-12-15 DIAGNOSIS — F0390 Unspecified dementia without behavioral disturbance: Secondary | ICD-10-CM | POA: Diagnosis not present

## 2018-12-15 DIAGNOSIS — L89152 Pressure ulcer of sacral region, stage 2: Secondary | ICD-10-CM | POA: Diagnosis not present

## 2018-12-15 DIAGNOSIS — R296 Repeated falls: Secondary | ICD-10-CM | POA: Diagnosis not present

## 2018-12-15 DIAGNOSIS — M129 Arthropathy, unspecified: Secondary | ICD-10-CM | POA: Diagnosis not present

## 2018-12-15 DIAGNOSIS — F05 Delirium due to known physiological condition: Secondary | ICD-10-CM | POA: Diagnosis not present

## 2018-12-15 DIAGNOSIS — R2681 Unsteadiness on feet: Secondary | ICD-10-CM | POA: Diagnosis not present

## 2019-01-15 DIAGNOSIS — M542 Cervicalgia: Secondary | ICD-10-CM | POA: Diagnosis not present

## 2019-01-15 DIAGNOSIS — L89152 Pressure ulcer of sacral region, stage 2: Secondary | ICD-10-CM | POA: Diagnosis not present

## 2019-01-15 DIAGNOSIS — G20 Parkinson's disease: Secondary | ICD-10-CM | POA: Diagnosis not present

## 2019-01-15 DIAGNOSIS — F05 Delirium due to known physiological condition: Secondary | ICD-10-CM | POA: Diagnosis not present

## 2019-01-15 DIAGNOSIS — F0281 Dementia in other diseases classified elsewhere with behavioral disturbance: Secondary | ICD-10-CM | POA: Diagnosis not present

## 2019-01-15 DIAGNOSIS — M129 Arthropathy, unspecified: Secondary | ICD-10-CM | POA: Diagnosis not present

## 2019-01-15 DIAGNOSIS — R2681 Unsteadiness on feet: Secondary | ICD-10-CM | POA: Diagnosis not present

## 2019-01-15 DIAGNOSIS — F0390 Unspecified dementia without behavioral disturbance: Secondary | ICD-10-CM | POA: Diagnosis not present

## 2019-01-15 DIAGNOSIS — R296 Repeated falls: Secondary | ICD-10-CM | POA: Diagnosis not present

## 2019-01-15 DIAGNOSIS — J449 Chronic obstructive pulmonary disease, unspecified: Secondary | ICD-10-CM | POA: Diagnosis not present

## 2019-01-23 ENCOUNTER — Emergency Department (HOSPITAL_COMMUNITY): Payer: Medicare HMO

## 2019-01-23 ENCOUNTER — Encounter (HOSPITAL_COMMUNITY): Payer: Self-pay

## 2019-01-23 ENCOUNTER — Other Ambulatory Visit: Payer: Self-pay

## 2019-01-23 ENCOUNTER — Observation Stay (HOSPITAL_COMMUNITY)
Admission: EM | Admit: 2019-01-23 | Discharge: 2019-01-24 | Disposition: A | Discharge status: Home / Self Care | Payer: Medicare HMO | Attending: Internal Medicine | Admitting: Internal Medicine

## 2019-01-23 DIAGNOSIS — Z9981 Dependence on supplemental oxygen: Secondary | ICD-10-CM | POA: Insufficient documentation

## 2019-01-23 DIAGNOSIS — R1310 Dysphagia, unspecified: Secondary | ICD-10-CM | POA: Diagnosis not present

## 2019-01-23 DIAGNOSIS — R531 Weakness: Secondary | ICD-10-CM

## 2019-01-23 DIAGNOSIS — E7800 Pure hypercholesterolemia, unspecified: Secondary | ICD-10-CM | POA: Insufficient documentation

## 2019-01-23 DIAGNOSIS — Z79899 Other long term (current) drug therapy: Secondary | ICD-10-CM | POA: Diagnosis not present

## 2019-01-23 DIAGNOSIS — R197 Diarrhea, unspecified: Secondary | ICD-10-CM | POA: Diagnosis not present

## 2019-01-23 DIAGNOSIS — I10 Essential (primary) hypertension: Secondary | ICD-10-CM | POA: Insufficient documentation

## 2019-01-23 DIAGNOSIS — Z8673 Personal history of transient ischemic attack (TIA), and cerebral infarction without residual deficits: Secondary | ICD-10-CM | POA: Diagnosis not present

## 2019-01-23 DIAGNOSIS — N400 Enlarged prostate without lower urinary tract symptoms: Secondary | ICD-10-CM | POA: Diagnosis not present

## 2019-01-23 DIAGNOSIS — N281 Cyst of kidney, acquired: Secondary | ICD-10-CM | POA: Diagnosis not present

## 2019-01-23 DIAGNOSIS — M79674 Pain in right toe(s): Secondary | ICD-10-CM | POA: Insufficient documentation

## 2019-01-23 DIAGNOSIS — R319 Hematuria, unspecified: Secondary | ICD-10-CM | POA: Diagnosis not present

## 2019-01-23 DIAGNOSIS — J449 Chronic obstructive pulmonary disease, unspecified: Secondary | ICD-10-CM | POA: Diagnosis not present

## 2019-01-23 DIAGNOSIS — K219 Gastro-esophageal reflux disease without esophagitis: Secondary | ICD-10-CM | POA: Diagnosis not present

## 2019-01-23 DIAGNOSIS — Z87891 Personal history of nicotine dependence: Secondary | ICD-10-CM | POA: Insufficient documentation

## 2019-01-23 DIAGNOSIS — R2689 Other abnormalities of gait and mobility: Secondary | ICD-10-CM | POA: Diagnosis not present

## 2019-01-23 DIAGNOSIS — I499 Cardiac arrhythmia, unspecified: Secondary | ICD-10-CM | POA: Diagnosis not present

## 2019-01-23 DIAGNOSIS — R9431 Abnormal electrocardiogram [ECG] [EKG]: Secondary | ICD-10-CM | POA: Insufficient documentation

## 2019-01-23 DIAGNOSIS — Z888 Allergy status to other drugs, medicaments and biological substances status: Secondary | ICD-10-CM | POA: Insufficient documentation

## 2019-01-23 DIAGNOSIS — K5730 Diverticulosis of large intestine without perforation or abscess without bleeding: Secondary | ICD-10-CM | POA: Diagnosis not present

## 2019-01-23 DIAGNOSIS — R2681 Unsteadiness on feet: Secondary | ICD-10-CM | POA: Insufficient documentation

## 2019-01-23 DIAGNOSIS — G20 Parkinson's disease: Secondary | ICD-10-CM | POA: Diagnosis not present

## 2019-01-23 DIAGNOSIS — I480 Paroxysmal atrial fibrillation: Secondary | ICD-10-CM | POA: Insufficient documentation

## 2019-01-23 DIAGNOSIS — I2510 Atherosclerotic heart disease of native coronary artery without angina pectoris: Secondary | ICD-10-CM | POA: Insufficient documentation

## 2019-01-23 DIAGNOSIS — I959 Hypotension, unspecified: Secondary | ICD-10-CM | POA: Diagnosis not present

## 2019-01-23 DIAGNOSIS — A084 Viral intestinal infection, unspecified: Secondary | ICD-10-CM

## 2019-01-23 DIAGNOSIS — M6281 Muscle weakness (generalized): Secondary | ICD-10-CM | POA: Insufficient documentation

## 2019-01-23 DIAGNOSIS — R4020 Unspecified coma: Secondary | ICD-10-CM | POA: Diagnosis not present

## 2019-01-23 DIAGNOSIS — F0280 Dementia in other diseases classified elsewhere without behavioral disturbance: Secondary | ICD-10-CM | POA: Diagnosis not present

## 2019-01-23 DIAGNOSIS — E860 Dehydration: Secondary | ICD-10-CM | POA: Diagnosis not present

## 2019-01-23 DIAGNOSIS — R140 Abdominal distension (gaseous): Secondary | ICD-10-CM | POA: Diagnosis not present

## 2019-01-23 DIAGNOSIS — L8990 Pressure ulcer of unspecified site, unspecified stage: Secondary | ICD-10-CM | POA: Diagnosis present

## 2019-01-23 DIAGNOSIS — R404 Transient alteration of awareness: Secondary | ICD-10-CM | POA: Diagnosis not present

## 2019-01-23 LAB — URINALYSIS, ROUTINE W REFLEX MICROSCOPIC
Bilirubin Urine: NEGATIVE
Glucose, UA: NEGATIVE mg/dL
Ketones, ur: NEGATIVE mg/dL
Leukocytes, UA: NEGATIVE
NITRITE: NEGATIVE
PH: 5 (ref 5.0–8.0)
Protein, ur: NEGATIVE mg/dL
Specific Gravity, Urine: 1.023 (ref 1.005–1.030)

## 2019-01-23 LAB — COMPREHENSIVE METABOLIC PANEL
ALT: 8 U/L (ref 0–44)
AST: 30 U/L (ref 15–41)
Albumin: 3 g/dL — ABNORMAL LOW (ref 3.5–5.0)
Alkaline Phosphatase: 38 U/L (ref 38–126)
Anion gap: 6 (ref 5–15)
BILIRUBIN TOTAL: 1.6 mg/dL — AB (ref 0.3–1.2)
BUN: 26 mg/dL — AB (ref 8–23)
CHLORIDE: 111 mmol/L (ref 98–111)
CO2: 19 mmol/L — ABNORMAL LOW (ref 22–32)
CREATININE: 1.22 mg/dL (ref 0.61–1.24)
Calcium: 7.7 mg/dL — ABNORMAL LOW (ref 8.9–10.3)
GFR calc non Af Amer: 53 mL/min — ABNORMAL LOW (ref >60)
Glucose, Bld: 97 mg/dL (ref 70–99)
POTASSIUM: 4.9 mmol/L (ref 3.5–5.1)
Sodium: 136 mmol/L (ref 135–145)
TOTAL PROTEIN: 5.7 g/dL — AB (ref 6.5–8.1)

## 2019-01-23 LAB — CBC
HEMATOCRIT: 44.5 % (ref 39.0–52.0)
Hemoglobin: 14.7 g/dL (ref 13.0–17.0)
MCH: 28.9 pg (ref 26.0–34.0)
MCHC: 33 g/dL (ref 30.0–36.0)
MCV: 87.4 fL (ref 80.0–100.0)
PLATELETS: 168 10*3/uL (ref 150–400)
RBC: 5.09 MIL/uL (ref 4.22–5.81)
RDW: 15.9 % — AB (ref 11.5–15.5)
WBC: 6.6 10*3/uL (ref 4.0–10.5)
nRBC: 0 % (ref 0.0–0.2)

## 2019-01-23 MED ORDER — ACETAMINOPHEN 650 MG RE SUPP: 650.0000 mg | Freq: Four times a day (QID) | RECTAL | Status: DC | PRN

## 2019-01-23 MED ORDER — ONDANSETRON HCL 4 MG PO TABS: 4.0000 mg | ORAL_TABLET | Freq: Four times a day (QID) | ORAL | Status: DC | PRN

## 2019-01-23 MED ORDER — QUETIAPINE FUMARATE 25 MG PO TABS: 25.0000 mg | ORAL_TABLET | ORAL | Status: DC

## 2019-01-23 MED ORDER — QUETIAPINE FUMARATE 25 MG PO TABS
25.0000 mg | ORAL_TABLET | Freq: Every day | ORAL | Status: DC | PRN
  Filled 2019-01-23: qty 1

## 2019-01-23 MED ORDER — ONDANSETRON HCL 4 MG/2ML IJ SOLN: 4.0000 mg | Freq: Four times a day (QID) | INTRAMUSCULAR | Status: DC | PRN

## 2019-01-23 MED ORDER — CARBIDOPA-LEVODOPA 25-100 MG PO TABS
1.0000 | ORAL_TABLET | Freq: Two times a day (BID) | ORAL | Status: DC
  Administered 2019-01-23 – 2019-01-24 (×2): 1 via ORAL
  Filled 2019-01-23 (×3): qty 1

## 2019-01-23 MED ORDER — ENOXAPARIN SODIUM 40 MG/0.4ML ~~LOC~~ SOLN
40.0000 mg | SUBCUTANEOUS | Status: DC
  Administered 2019-01-23: 40 mg via SUBCUTANEOUS
  Filled 2019-01-23: qty 0.4

## 2019-01-23 MED ORDER — PNEUMOCOCCAL VAC POLYVALENT 25 MCG/0.5ML IJ INJ
0.5000 mL | INJECTION | INTRAMUSCULAR | Status: DC
  Filled 2019-01-23: qty 0.5

## 2019-01-23 MED ORDER — LACTATED RINGERS IV BOLUS
1000.0000 mL | Freq: Once | INTRAVENOUS | Status: AC
  Administered 2019-01-23: 1000 mL via INTRAVENOUS

## 2019-01-23 MED ORDER — INFLUENZA VAC SPLIT HIGH-DOSE 0.5 ML IM SUSY
0.5000 mL | PREFILLED_SYRINGE | INTRAMUSCULAR | Status: DC
  Filled 2019-01-23: qty 0.5

## 2019-01-23 MED ORDER — ACETAMINOPHEN 325 MG PO TABS: 650.0000 mg | ORAL_TABLET | Freq: Four times a day (QID) | ORAL | Status: DC | PRN

## 2019-01-23 MED ORDER — IOHEXOL 300 MG/ML  SOLN
100.0000 mL | Freq: Once | INTRAMUSCULAR | Status: AC | PRN
  Administered 2019-01-23: 100 mL via INTRAVENOUS

## 2019-01-23 MED ORDER — ISOSORBIDE MONONITRATE ER 30 MG PO TB24
15.0000 mg | ORAL_TABLET | Freq: Every day | ORAL | Status: DC
  Administered 2019-01-24: 15 mg via ORAL
  Filled 2019-01-23: qty 1

## 2019-01-23 MED ORDER — SODIUM CHLORIDE 0.9 % IV BOLUS
500.0000 mL | Freq: Once | INTRAVENOUS | Status: AC
  Administered 2019-01-23: 500 mL via INTRAVENOUS

## 2019-01-23 MED ORDER — CARBIDOPA-LEVODOPA 25-100 MG PO TABS: 1.0000 | ORAL_TABLET | Freq: Two times a day (BID) | ORAL | Status: DC

## 2019-01-23 MED ORDER — TAMSULOSIN HCL 0.4 MG PO CAPS
0.4000 mg | ORAL_CAPSULE | Freq: Every day | ORAL | Status: DC
  Administered 2019-01-24: 0.4 mg via ORAL
  Filled 2019-01-23: qty 1

## 2019-01-23 MED ORDER — QUETIAPINE FUMARATE 25 MG PO TABS
25.0000 mg | ORAL_TABLET | Freq: Every day | ORAL | Status: DC
  Administered 2019-01-23: 25 mg via ORAL
  Filled 2019-01-23 (×2): qty 1

## 2019-01-23 NOTE — H&P (Signed)
Date: 01/23/2019               Patient Name:  LYNDA CAPISTRAN MRN: [id number]  DOB: Jun 27, 1928 Age / Sex: 85 y.o., male   PCP: Alvester Chou, NP         Medical Service: Internal Medicine Teaching Service         Attending Physician: Dr. Rebeca Alert Raynaldo Opitz, MD    First Contact: Dr. Laural Golden Pager: [phone]  Second Contact: Dr. Tarri Abernethy Pager: [phone]       After Hours (After 5p/  First Contact Pager: [phone]  weekends / holidays): Second Contact Pager: [phone]   Chief Complaint: Generalized Weakness  History of Present Illness: Ko Bardon is an 85 y.o male with CAD, COPD, and Parkinson's with dementia who presented to the ED with generalized weakness. Patient with dementia and appears alert. Majority of history obtained from the patient and the patient's son, Mayank Teuscher. For collateral, the patient is typically alert, oriented to self, oriented to place, and able to hold a conversation. Needs help with ADLs and mobilization.    Since Monday has had progressive watery diarrhea, having a BM every 2 hours. Family is concerned that he appears to be getting progressively weaker and dehydrated. They have tried imodium without relief. Patient is also having some abdominal bloating and distention. No recent fevers, cough, SOB, bloody stools. Concerned with weakness and dehydration. Abdominal bloating and distended. The patient's care giver did have a "stomach bug" with N/V this past weekend. No recent antibiotics and is not on a PPI.   In addition to the above concerns the family is worried about some bed sores. They were initially small; however, have been getting worse with the diarrhea. The patient is also complaining of pain in the right foot, second toe.   The patient and his family are not interested in SNF but would be interested in Home health if needed. Son states that the patient is a full code but would not want life support. Discussed the implications of CPR. Patient's  son and daughter will have a more detailed discussion of whether full code is appropriate for their father.   Meds:  No current facility-administered medications on file prior to encounter.    Current Outpatient Medications on File Prior to Encounter  Medication Sig Dispense Refill  . acetaminophen (TYLENOL) 500 MG tablet Take 500 mg by mouth at bedtime.    . carbidopa-levodopa (SINEMET IR) 25-100 MG tablet Take 1 tablet by mouth 2 (two) times daily.    Marland Kitchen CRANBERRY PO Take 1 tablet by mouth daily.    . isosorbide mononitrate (IMDUR) 30 MG 24 hr tablet Take 0.5 tablets (15 mg total) by mouth daily. 30 tablet 0  . loperamide (IMODIUM A-D) 2 MG tablet Take 2 mg by mouth 4 (four) times daily as needed for diarrhea or loose stools.    . Melatonin 3 MG TABS Take 3-6 mg by mouth at bedtime.    . naphazoline-glycerin (CLEAR EYES) 0.012-0.2 % SOLN Place 1-2 drops into both eyes 4 (four) times daily as needed for eye irritation.  0  . NON FORMULARY Take 1 capsule by mouth See admin instructions. OnGuard Immune Defense softgel capsules: Take 1 capsule by mouth once a day    . NON FORMULARY Take 1 packet by mouth See admin instructions. Melaleuca AM and PM Longevity vitamin packets: Take 1 packet by mouth in the morning and 1 packet in the evening    .  QUEtiapine (SEROQUEL) 25 MG tablet Take 25 mg by mouth See admin instructions. Take 25 mg by mouth at bedtime and an additional 25 mg in the morning if hallucinating    . tamsulosin (FLOMAX) 0.4 MG CAPS capsule Take 0.4 mg by mouth daily.    . feeding supplement, ENSURE ENLIVE, (ENSURE ENLIVE) LIQD Take 237 mLs by mouth 2 (two) times daily between meals. (Patient not taking: Reported on 01/23/2019) 237 mL 12   Allergies: Allergies as of 01/23/2019 - Review Complete 01/23/2019  Allergen Reaction Noted  . Bee venom Anaphylaxis 01/09/2017  . Adhesive [tape] Other (See Comments) 01/23/2019   Past Medical History:  Diagnosis Date  . Anxiety   . Arthritis      "bad in his knees" (01/09/2017)  . Benign prostatic hypertrophy    hx  . Chronic airway obstruction, not elsewhere classified   . Chronic bronchitis ([place])   . Coronary atherosclerosis of unspecified type of vessel, native or graft   . GERD (gastroesophageal reflux disease)   . New onset atrial fibrillation ([place]) 01/08/2017   [name] 01/08/2017  . On home oxygen therapy    "prn" (01/09/2017)  . Osteoarthrosis, unspecified whether generalized or localized, unspecified site   . Parkinson's disease ([place])    with dementia and psychosis.   . Pneumonia    "more than once" (01/09/2017)  . Pure hypercholesterolemia    IIA  . Skin cancer of face   . Stroke [facility])    "they think he had a minor stroke 1-2 yr ago" (01/09/2017)  . Unspecified essential hypertension    Family History  Problem Relation Age of Onset  . Stroke Mother   . Stroke Father   . Diabetes Brother   . Other Daughter        adopted   Social History:  Social History   Socioeconomic History  . Marital status: Married    Spouse name: Not on file  . Number of children: Not on file  . Years of education: Not on file  . Highest education level: Not on file  Occupational History  . Occupation: retired  Scientific laboratory technician  . Financial resource strain: Not on file  . Food insecurity:    Worry: Not on file    Inability: Not on file  . Transportation needs:    Medical: Not on file    Non-medical: Not on file  Tobacco Use  . Smoking status: Former Smoker    Years: 40.00    Types: Cigarettes    Last attempt to quit: 1979    Years since quitting: 41.0  . Smokeless tobacco: Never Used  Substance and Sexual Activity  . Alcohol use: Not Currently  . Drug use: No  . Sexual activity: Never  Lifestyle  . Physical activity:    Days per week: Not on file    Minutes per session: Not on file  . Stress: Not on file  Relationships  . Social connections:    Talks on phone: Not on file    Gets together: Not on file    Attends  religious service: Not on file    Active member of club or organization: Not on file    Attends meetings of clubs or organizations: Not on file    Relationship status: Not on file  . Intimate partner violence:    Fear of current or ex partner: Not on file    Emotionally abused: Not on file    Physically abused: Not on file  Forced sexual activity: Not on file  Other Topics Concern  . Not on file  Social History Narrative   Retired, married.     Review of Systems: A complete ROS was negative except as per HPI.   Physical Exam: Blood pressure (!) 141/59, pulse 63, temperature 97.6 F (36.4 C), temperature source Oral, resp. rate 16, SpO2 100 %.  Physical Exam  Constitutional: He is well-developed, well-nourished, and in no distress.  Eyes: Right eye exhibits no discharge. Left eye exhibits no discharge.  Right eye bottom lid erythematous and swollen  Cardiovascular: Normal rate, regular rhythm and normal heart sounds.  No murmur heard. Pulmonary/Chest: Effort normal and breath sounds normal. No respiratory distress. He has no wheezes.  Musculoskeletal:        General: No tenderness or edema.  Neurological: He is alert.  Not oriented to person place or time/unable to answer orientation questions  Skin: Skin is dry.  + skin turgor    EKG: personally reviewed my interpretation is it is low voltage, unable to tell if it is sinus or irregular, will repeat   CXR: n/a  CT Abdomen IMPRESSION: Fluid-filled colon consistent with the diarrheal state.  Scattered mildly dilated loops of small bowel with fecalization of small bowel contrast. No definitive obstructive changes seen in this raises suspicion for ileus or very early partial small-bowel obstruction. Correlation with the physical exam is recommended.  Chronic changes without acute abnormality.  Assessment & Plan by Problem: Active Problems:   Dehydration  Ott Zimmerle is an 85 y.o male with CAD, COPD, and  Parkinson's with dementia who presented to the ED with generalized weakness. Since Monday has had progressive watery diarrhea, having a BM every 2 hours.   Dehydration - Secondary to diarrhea that started on 1/20 - CT abdomen consistent with diarrheal state, no obstructive changes or acute abnormalities  - Follow GI panel - Follow up C difficile pcr - LR 1 L bolus   CAD, nonobstructive - continue Imdur 15 mg daily   ? Hx of paroxysmal atrial fibrillation - Patient found to have new onset afib without RVR during a hospital admission 12/2016 - Thought to have been triggered by COPD  - Was discharged to continue aspirin, was not considered a good candidate for DOAC due to fraility and high fall risk - CHADS2VASC score 3  COPD - No records of spirometry per chart review  Parkinson's with dementia Patient follows [facility] since 2018. He began shuffling 3 years prior to presentation. He has had a tremor in his right hand and hallucinations with behavioral disturbances requiring Depakote. In the past he was also treated with risperdal, clonazepam, quetiapine nuplazid and levodopa. Per chart review, last visit with Neurology was 04/15/2018.  - Continue Quetiapine 25 mg daily and carbidopa-levodopa bid   BPH - Continue tamulosin  Diet: Regular  DVT prophylaxis: Lovenox Full Code  Dispo: Admit patient to Observation with expected length of stay less than 2 midnights.  Signed: Mike Craze, DO 01/23/2019, 5:22 PM

## 2019-01-23 NOTE — ED Notes (Signed)
Pt transported to CT ?

## 2019-01-23 NOTE — ED Notes (Signed)
Patient transported to CT 

## 2019-01-23 NOTE — ED Provider Notes (Addendum)
[facility] EMERGENCY DEPARTMENT Provider Note   CSN: [id number] Arrival date & time: 01/23/19  1027     History   Chief Complaint No chief complaint on file.   HPI Terry Taylor is a 85 y.o. male.  Patient with hx dementia, noted with diarrhea, poor po intake, decreased responsiveness/decreased activity level, and generalized weakness for the past few days. Pt very poor/limited historian - level 5 caveat. No report of fever. No trauma/fall.   The history is provided by the patient and the EMS personnel. The history is limited by the condition of the patient.    Past Medical History:  Diagnosis Date  . Anxiety   . Arthritis    "bad in his knees" (01/09/2017)  . Benign prostatic hypertrophy    hx  . Chronic airway obstruction, not elsewhere classified   . Chronic bronchitis ([place])   . Coronary atherosclerosis of unspecified type of vessel, native or graft   . GERD (gastroesophageal reflux disease)   . New onset atrial fibrillation ([place]) 01/08/2017   [name] 01/08/2017  . On home oxygen therapy    "prn" (01/09/2017)  . Osteoarthrosis, unspecified whether generalized or localized, unspecified site   . Parkinson's disease ([place])    with dementia and psychosis.   . Pneumonia    "more than once" (01/09/2017)  . Pure hypercholesterolemia    IIA  . Skin cancer of face   . Stroke [facility])    "they think he had a minor stroke 1-2 yr ago" (01/09/2017)  . Unspecified essential hypertension     Patient Active Problem List   Diagnosis Date Noted  . Goals of care, counseling/discussion   . Palliative care encounter   . Pressure injury of skin 10/09/2017  . Polypharmacy   . Supplemental oxygen dependent   . PAF (paroxysmal atrial fibrillation) ([place])   . Benign essential HTN   . Parkinson disease ([place])   . Hypoalbuminemia due to protein-calorie malnutrition ([place])   . Overdose 10/04/2017  . Altered mental status 10/04/2017  . UTI (urinary tract infection)  10/04/2017  . Anemia 10/04/2017  . Renal insufficiency 10/04/2017  . Paroxysmal A-fib ([place]) 01/11/2017  . Influenza A 01/09/2017  . COPD (chronic obstructive pulmonary disease) ([place]) 01/08/2017  . Dementia ([place]) 09/13/2016  . Chest pain, rule out acute myocardial infarction 09/12/2016  . Fever 03/07/2013  . Acute encephalopathy 03/07/2013  . Weakness generalized 03/07/2013  . HYPERCHOLESTEROLEMIA  IIA 04/09/2009  . Essential hypertension 04/09/2009  . Coronary artery disease due to lipid rich plaque 04/09/2009  . COPD exacerbation ([place]) 04/09/2009  . OSTEOARTHRITIS 04/09/2009  . BENIGN PROSTATIC HYPERTROPHY, HX OF 04/09/2009    Past Surgical History:  Procedure Laterality Date  . CERVICAL DISCECTOMY    . FRACTURE SURGERY    . SKIN CANCER DESTRUCTION Right    "face"  . TIBIA FRACTURE SURGERY Right    "he's got a pin in there"        Home Medications    Prior to Admission medications   Medication Sig Start Date End Date Taking? Authorizing Provider  carbidopa-levodopa (SINEMET IR) 25-100 MG tablet Take 1 tablet by mouth 2 (two) times daily. 10/10/18   [provider]  feeding supplement, ENSURE ENLIVE, (ENSURE ENLIVE) LIQD Take 237 mLs by mouth 2 (two) times daily between meals. Patient not taking: Reported on 11/01/2018 10/12/17   Rai, Vernelle Emerald, MD  isosorbide mononitrate (IMDUR) 30 MG 24 hr tablet Take 0.5 tablets (15 mg total) by mouth  daily. 09/13/16   Ghimire, Henreitta Leber, MD  naphazoline-glycerin (CLEAR EYES) 0.012-0.2 % SOLN Place 1-2 drops into both eyes 4 (four) times daily as needed for eye irritation. Patient not taking: Reported on 11/01/2018 10/09/17   Rai, Vernelle Emerald, MD  ramelteon (ROZEREM) 8 MG tablet Take 8 mg by mouth at bedtime. 09/25/18   [provider]  tamsulosin (FLOMAX) 0.4 MG CAPS Take 0.4 mg by mouth daily.    [provider]    Family History Family History  Problem Relation Age of Onset  . Stroke Mother   . Stroke  Father   . Diabetes Brother   . Other Daughter        adopted    Social History Social History   Tobacco Use  . Smoking status: Former Smoker    Years: 40.00    Types: Cigarettes    Last attempt to quit: 1979    Years since quitting: 41.0  . Smokeless tobacco: Never Used  Substance Use Topics  . Alcohol use: Not Currently  . Drug use: No     Allergies   Bee venom   Review of Systems Review of Systems  Unable to perform ROS: Mental status change  level 5 caveat  - dementia/mental status changes   Physical Exam Updated Vital Signs There were no vitals taken for this visit.  Physical Exam Vitals signs and nursing note reviewed.  Constitutional:      Appearance: Normal appearance. He is well-developed.  HENT:     Head: Atraumatic.     Nose: Nose normal.     Mouth/Throat:     Mouth: Mucous membranes are moist.     Pharynx: Oropharynx is clear.  Eyes:     General: No scleral icterus.    Conjunctiva/sclera: Conjunctivae normal.     Pupils: Pupils are equal, round, and reactive to light.  Neck:     Musculoskeletal: Normal range of motion and neck supple. No neck rigidity.     Trachea: No tracheal deviation.     Comments: No stiffness or rigidity.  Cardiovascular:     Rate and Rhythm: Normal rate and regular rhythm.     Pulses: Normal pulses.     Heart sounds: Normal heart sounds. No murmur. No friction rub. No gallop.   Pulmonary:     Effort: Pulmonary effort is normal. No accessory muscle usage or respiratory distress.     Breath sounds: Normal breath sounds.  Abdominal:     General: Bowel sounds are normal.     Palpations: Abdomen is soft.     Tenderness: There is no abdominal tenderness. There is no guarding.     Comments: Mild distension.   Genitourinary:    Comments: No cva tenderness. Musculoskeletal:        General: No swelling.  Skin:    General: Skin is warm and dry.     Findings: No rash.  Neurological:     Mental Status: He is alert.      Comments: Awake, alert appearing. Moves bil extremities purposefully.   Psychiatric:        Mood and Affect: Mood normal.      ED Treatments / Results  Labs (all labs ordered are listed, but only abnormal results are displayed) Results for orders placed or performed during the hospital encounter of 01/23/19  CBC  Result Value Ref Range   WBC 6.6 4.0 - 10.5 K/uL   RBC 5.09 4.22 - 5.81 MIL/uL   Hemoglobin 14.7 13.0 -  17.0 g/dL   HCT 44.5 39.0 - 52.0 %   MCV 87.4 80.0 - 100.0 fL   MCH 28.9 26.0 - 34.0 pg   MCHC 33.0 30.0 - 36.0 g/dL   RDW 15.9 (H) 11.5 - 15.5 %   Platelets 168 150 - 400 K/uL   nRBC 0.0 0.0 - 0.2 %  Comprehensive metabolic panel  Result Value Ref Range   Sodium 136 135 - 145 mmol/L   Potassium 4.9 3.5 - 5.1 mmol/L   Chloride 111 98 - 111 mmol/L   CO2 19 (L) 22 - 32 mmol/L   Glucose, Bld 97 70 - 99 mg/dL   BUN 26 (H) 8 - 23 mg/dL   Creatinine, Ser 1.22 0.61 - 1.24 mg/dL   Calcium 7.7 (L) 8.9 - 10.3 mg/dL   Total Protein 5.7 (L) 6.5 - 8.1 g/dL   Albumin 3.0 (L) 3.5 - 5.0 g/dL   AST 30 15 - 41 U/L   ALT 8 0 - 44 U/L   Alkaline Phosphatase 38 38 - 126 U/L   Total Bilirubin 1.6 (H) 0.3 - 1.2 mg/dL   GFR calc non Af Amer 53 (L) >60 mL/min   GFR calc Af Amer >60 >60 mL/min   Anion gap 6 5 - 15  Urinalysis, Routine w reflex microscopic  Result Value Ref Range   Color, Urine YELLOW YELLOW   APPearance CLEAR CLEAR   Specific Gravity, Urine 1.023 1.005 - 1.030   pH 5.0 5.0 - 8.0   Glucose, UA NEGATIVE NEGATIVE mg/dL   Hgb urine dipstick MODERATE (A) NEGATIVE   Bilirubin Urine NEGATIVE NEGATIVE   Ketones, ur NEGATIVE NEGATIVE mg/dL   Protein, ur NEGATIVE NEGATIVE mg/dL   Nitrite NEGATIVE NEGATIVE   Leukocytes, UA NEGATIVE NEGATIVE   RBC / HPF 21-50 0 - 5 RBC/hpf   WBC, UA 0-5 0 - 5 WBC/hpf   Bacteria, UA RARE (A) NONE SEEN   Mucus PRESENT    Hyaline Casts, UA PRESENT    Ct Head Wo Contrast  Result Date: 01/23/2019 CLINICAL DATA:  Altered level of  consciousness EXAM: CT HEAD WITHOUT CONTRAST TECHNIQUE: Contiguous axial images were obtained from the base of the skull through the vertex without intravenous contrast. COMPARISON:  10/05/2017 MRI FINDINGS: Brain: Chronic atrophic changes are again noted. Stable ventriculomegaly is seen. No findings to suggest acute hemorrhage, acute infarction or space-occupying mass lesion are noted. Vascular: No hyperdense vessel or unexpected calcification. Skull: Normal. Negative for fracture or focal lesion. Sinuses/Orbits: No acute finding. Other: None. IMPRESSION: Chronic atrophic changes stable from the previous exam. No acute intracranial abnormality noted. Electronically Signed   By: Inez Catalina M.D.   On: 01/23/2019 13:36    EKG EKG Interpretation  Date/Time:  Friday January 23 2019 10:36:54 EST Ventricular Rate:  59 PR Interval:    QRS Duration: 112 QT Interval:  417 QTC Calculation: 414 R Axis:   25 Text Interpretation:  Sinus arrhythmia Low voltage QRS Confirmed by Lajean Saver [phone]) on 01/23/2019 10:41:14 AM Also confirmed by Lajean Saver [phone]), editor Shon Hale [phone])  on 01/23/2019 12:29:38 PM   Radiology Ct Head Wo Contrast  Result Date: 01/23/2019 CLINICAL DATA:  Altered level of consciousness EXAM: CT HEAD WITHOUT CONTRAST TECHNIQUE: Contiguous axial images were obtained from the base of the skull through the vertex without intravenous contrast. COMPARISON:  10/05/2017 MRI FINDINGS: Brain: Chronic atrophic changes are again noted. Stable ventriculomegaly is seen. No findings to suggest acute hemorrhage, acute infarction or space-occupying  mass lesion are noted. Vascular: No hyperdense vessel or unexpected calcification. Skull: Normal. Negative for fracture or focal lesion. Sinuses/Orbits: No acute finding. Other: None. IMPRESSION: Chronic atrophic changes stable from the previous exam. No acute intracranial abnormality noted. Electronically Signed   By: Inez Catalina M.D.   On:  01/23/2019 13:36    Procedures Procedures (including critical care time)  Medications Ordered in ED Medications  sodium chloride 0.9 % bolus 500 mL (has no administration in time range)     Initial Impression / Assessment and Plan / ED Course  I have reviewed the triage vital signs and the nursing notes.  Pertinent labs & imaging results that were available during my care of the patient were reviewed by me and considered in my medical decision making (see chart for details).  Iv ns bolus. Labs. Ecg. Ct head re: altered mental status.   Reviewed nursing notes and prior charts for additional history.   CT head reviewed - neg acute.  Labs reviewed - hc03 sl low, bun elev, felt c/w pts history diarrhea and mild/mod volume depletion. Iv ns bolus. Po fluids.   Family arrives, they note profuse diarrhea at home, 'like a river', and that abd seemed distended, ?painful. No known ill contacts. No recent abx therapy. Will send stool to lab if recurrent diarrhea in ED, and will add CT abd r/o colitis.   Given dehydration, poor po intake, diarrhea, will admit for ivf, obs.   Medicine service consulted for admission. Discussed pt - they will admit.     Final Clinical Impressions(s) / ED Diagnoses   Final diagnoses:  None    ED Discharge Orders    None           Lajean Saver, MD 01/23/19 1523

## 2019-01-23 NOTE — ED Triage Notes (Signed)
To ED via [facility] from home, lives with caregiver- c/o increasing somnolence, with diarrhea.

## 2019-01-24 DIAGNOSIS — F0280 Dementia in other diseases classified elsewhere without behavioral disturbance: Secondary | ICD-10-CM | POA: Diagnosis not present

## 2019-01-24 DIAGNOSIS — N400 Enlarged prostate without lower urinary tract symptoms: Secondary | ICD-10-CM | POA: Diagnosis not present

## 2019-01-24 DIAGNOSIS — I2510 Atherosclerotic heart disease of native coronary artery without angina pectoris: Secondary | ICD-10-CM | POA: Diagnosis not present

## 2019-01-24 DIAGNOSIS — E860 Dehydration: Secondary | ICD-10-CM | POA: Diagnosis not present

## 2019-01-24 DIAGNOSIS — R319 Hematuria, unspecified: Secondary | ICD-10-CM

## 2019-01-24 DIAGNOSIS — Z79899 Other long term (current) drug therapy: Secondary | ICD-10-CM | POA: Diagnosis not present

## 2019-01-24 DIAGNOSIS — J449 Chronic obstructive pulmonary disease, unspecified: Secondary | ICD-10-CM | POA: Diagnosis not present

## 2019-01-24 DIAGNOSIS — Z91048 Other nonmedicinal substance allergy status: Secondary | ICD-10-CM

## 2019-01-24 DIAGNOSIS — R197 Diarrhea, unspecified: Secondary | ICD-10-CM

## 2019-01-24 DIAGNOSIS — G20 Parkinson's disease: Secondary | ICD-10-CM

## 2019-01-24 DIAGNOSIS — R2689 Other abnormalities of gait and mobility: Secondary | ICD-10-CM | POA: Diagnosis not present

## 2019-01-24 DIAGNOSIS — A084 Viral intestinal infection, unspecified: Secondary | ICD-10-CM

## 2019-01-24 DIAGNOSIS — I480 Paroxysmal atrial fibrillation: Secondary | ICD-10-CM | POA: Diagnosis not present

## 2019-01-24 DIAGNOSIS — R2681 Unsteadiness on feet: Secondary | ICD-10-CM | POA: Diagnosis not present

## 2019-01-24 DIAGNOSIS — Z91030 Bee allergy status: Secondary | ICD-10-CM

## 2019-01-24 LAB — BASIC METABOLIC PANEL
ANION GAP: 11 (ref 5–15)
BUN: 20 mg/dL (ref 8–23)
CO2: 18 mmol/L — ABNORMAL LOW (ref 22–32)
Calcium: 7.9 mg/dL — ABNORMAL LOW (ref 8.9–10.3)
Chloride: 108 mmol/L (ref 98–111)
Creatinine, Ser: 1.19 mg/dL (ref 0.61–1.24)
GFR calc Af Amer: >60 mL/min (ref >60)
GFR calc non Af Amer: 54 mL/min — ABNORMAL LOW (ref >60)
Glucose, Bld: 82 mg/dL (ref 70–99)
Potassium: 3.5 mmol/L (ref 3.5–5.1)
Sodium: 137 mmol/L (ref 135–145)

## 2019-01-24 MED ORDER — LACTATED RINGERS IV BOLUS
1000.0000 mL | Freq: Once | INTRAVENOUS | Status: AC
  Administered 2019-01-24: 1000 mL via INTRAVENOUS

## 2019-01-24 MED ORDER — LACTATED RINGERS IV SOLN: INTRAVENOUS | Status: DC

## 2019-01-24 NOTE — Evaluation (Signed)
Physical Therapy Evaluation Patient Details Name: Terry Taylor MRN: [id number] DOB: [birth year] Today's Date: 01/24/2019   History of Present Illness  Patient is a 85 y/o male presenting to the ED on 01/23/19 with primary complaints of generalized weakness. PMH signficant for CAD, COPD, and Parkinson's with dementia. Admitted for dehydration secondary to diarrhea.     Clinical Impression  Patient admitted with the above listed diagnosis. Patient is a poor historian with caregiver, Amy, providing PLOF. Prior to admission patient able to assist with transfers and household gait distances with RW, with patient today requiring up to Max A +2 for all bed mobility and transfers. Patient also demonstrating poor sitting balance with up to Max A to maintain midline. Patient currently to benefit from short term rehab to regain strength and mobility prior to returning home. PT to continue to follow acutely.     Follow Up Recommendations SNF;Supervision/Assistance - 24 hour    Equipment Recommendations  Other (comment)(defer)    Recommendations for Other Services       Precautions / Restrictions Precautions Precautions: Fall Restrictions Weight Bearing Restrictions: No      Mobility  Bed Mobility Overal bed mobility: Needs Assistance Bed Mobility: Supine to Sit     Supine to sit: Max assist     General bed mobility comments: patient initially attempting to bring LE to EOB - requires MAx A to complete bed mobility  Transfers Overall transfer level: Needs assistance Equipment used: Rolling walker (2 wheeled);2 person hand held assist Transfers: Sit to/from [facility] Sit to Stand: Max assist Stand pivot transfers: Max assist;+2 physical assistance       General transfer comment: Max A to come into standing - poor upright posturing; Max A +2 for pivot transfer with poor ability to shuffle feet  Ambulation/Gait             General Gait Details: deferred for  patient/therapist safety  Stairs            Wheelchair Mobility    Modified Rankin (Stroke Patients Only)       Balance Overall balance assessment: Needs assistance Sitting-balance support: Bilateral upper extremity supported;Feet supported Sitting balance-Leahy Scale: Poor Sitting balance - Comments: up to Max A to maintain sitting balance Postural control: Posterior lean;Left lateral lean Standing balance support: Bilateral upper extremity supported;During functional activity Standing balance-Leahy Scale: Zero                               Pertinent Vitals/Pain Pain Assessment: Faces Faces Pain Scale: Hurts little more Pain Location: L foot Pain Descriptors / Indicators: Aching;Discomfort;Grimacing;Guarding Pain Intervention(s): Limited activity within patient's tolerance;Monitored during session;Repositioned    Home Living Family/patient expects to be discharged to:: Private residence Living Arrangements: Children;Non-relatives/Friends Available Help at Discharge: Family;Personal care attendant;Available 24 hours/day Type of Home: House Home Access: (1 threshold)     Home Layout: One level(1 threshold to enter bedroom) Home Equipment: Walker - 2 wheels      Prior Function Level of Independence: Needs assistance   Gait / Transfers Assistance Needed: patient able to assist with transfers and household mobility with RW  ADL's / Homemaking Assistance Needed: caregiver provided assist for ADLs and meals        Hand Dominance        Extremity/Trunk Assessment   Upper Extremity Assessment Upper Extremity Assessment: Defer to OT evaluation    Lower Extremity Assessment Lower Extremity Assessment: Generalized  weakness    Cervical / Trunk Assessment Cervical / Trunk Assessment: Kyphotic  Communication   Communication: Receptive difficulties;Expressive difficulties(likely due to baseline dementia)  Cognition Arousal/Alertness:  Awake/alert Behavior During Therapy: Flat affect Overall Cognitive Status: History of cognitive impairments - at baseline                                 General Comments: patient unaware of current surroundings; talks as if he is currently at his home      General Comments General comments (skin integrity, edema, etc.): caregiver in room to provide PLOF    Exercises     Assessment/Plan    PT Assessment Patient needs continued PT services  PT Problem List Decreased strength;Decreased activity tolerance;Decreased balance;Decreased mobility;Decreased knowledge of use of DME;Decreased safety awareness       PT Treatment Interventions DME instruction;Gait training;Functional mobility training;Therapeutic activities;Therapeutic exercise;Balance training;Patient/family education    PT Goals (Current goals can be found in the Care Plan section)  Acute Rehab PT Goals Patient Stated Goal: none stated PT Goal Formulation: With patient Time For Goal Achievement: 02/07/19 Potential to Achieve Goals: Fair    Frequency Min 3X/week   Barriers to discharge        Co-evaluation               AM-PAC PT "6 Clicks" Mobility  Outcome Measure Help needed turning from your back to your side while in a flat bed without using bedrails?: Total Help needed moving from lying on your back to sitting on the side of a flat bed without using bedrails?: Total Help needed moving to and from a bed to a chair (including a wheelchair)?: Total Help needed standing up from a chair using your arms (e.g., wheelchair or bedside chair)?: A Lot Help needed to walk in hospital room?: Total Help needed climbing 3-5 steps with a railing? : Total 6 Click Score: 7    End of Session   Activity Tolerance: Patient tolerated treatment well Patient left: in chair;with call bell/phone within reach;with chair alarm set;with family/visitor present Nurse Communication: Mobility status PT Visit  Diagnosis: Unsteadiness on feet (R26.81);Other abnormalities of gait and mobility (R26.89);Muscle weakness (generalized) (M62.81)    Time: 3810-1751 PT Time Calculation (min) (ACUTE ONLY): 28 min   Charges:   PT Evaluation $PT Eval Moderate Complexity: 1 Mod          Lanney Gins, PT, DPT Supplemental Physical Therapist 01/24/19 10:19 AM Pager: [phone] Office: [phone]

## 2019-01-24 NOTE — Discharge Summary (Addendum)
Name: Terry Taylor MRN: [id number] DOB: 06/26/1928 85 y.o. PCP: Alvester Chou, NP  Date of Admission: 01/23/2019 10:27 AM Date of Discharge: 01/24/2019 Attending Physician: Sid Falcon, MD  Discharge Diagnosis: 1. Dehydration  Discharge Medications: Allergies as of 01/24/2019      Reactions   Bee Venom Anaphylaxis   Adhesive [tape] Other (See Comments)   TEARS THE SKIN      Medication List    TAKE these medications   acetaminophen 500 MG tablet Commonly known as:  TYLENOL Take 500 mg by mouth at bedtime.   carbidopa-levodopa 25-100 MG tablet Commonly known as:  SINEMET IR Take 1 tablet by mouth 2 (two) times daily.   CRANBERRY PO Take 1 tablet by mouth daily.   feeding supplement (ENSURE ENLIVE) Liqd Take 237 mLs by mouth 2 (two) times daily between meals.   isosorbide mononitrate 30 MG 24 hr tablet Commonly known as:  IMDUR Take 0.5 tablets (15 mg total) by mouth daily.   loperamide 2 MG tablet Commonly known as:  IMODIUM A-D Take 2 mg by mouth 4 (four) times daily as needed for diarrhea or loose stools.   Melatonin 3 MG Tabs Take 3-6 mg by mouth at bedtime.   naphazoline-glycerin 0.012-0.2 % Soln Commonly known as:  CLEAR EYES REDNESS Place 1-2 drops into both eyes 4 (four) times daily as needed for eye irritation. What changed:    when to take this  additional instructions   NON FORMULARY Take 1 capsule by mouth See admin instructions. OnGuard Immune Defense softgel capsules: Take 1 capsule by mouth once a day   NON FORMULARY Take 1 packet by mouth See admin instructions. Melaleuca AM and PM Longevity vitamin packets: Take 1 packet by mouth in the morning and 1 packet in the evening   QUEtiapine 25 MG tablet Commonly known as:  SEROQUEL Take 25 mg by mouth See admin instructions. Take 25 mg by mouth at bedtime and an additional 25 mg in the morning if hallucinating   tamsulosin 0.4 MG Caps capsule Commonly known as:  FLOMAX Take 0.4 mg by  mouth daily.       Disposition and follow-up:   Terry Taylor was discharged from [facility] in Stable condition.  At the hospital follow up visit please address:  1.  Dehydration- patient reported diarrhea for the past week, most likely a viral enteritis. He was treated with IVF, please reassess volume status   Hematuria- Patient's UA on admission showed a moderate amount of Hgb, please recheck UA   2.  Labs / imaging needed at time of follow-up: UA  3.  Pending labs/ test needing follow-up: none  Follow-up Appointments: Patient advised to follow up with his PCP in one week.  Hospital Course by problem list: 1. Dehydration - presented to the ED with generalized weakness. Since Monday had progressive watery diarrhea, having a BM every 2 hours. CT abdomen consistent with diarrheal state, no obstructive changes or acute abnormalities. No leukocytosis or fevers. He had no bowel movements during hospital course. Given 2 liters of LR. PT recommended SNF, however patient's family preferred home health.   Discharge Vitals:   BP (!) 148/78 (BP Location: Right Arm)   Pulse 70   Temp 98.8 F (37.1 C) (Oral)   Resp 18   Ht 6' (1.829 m)   Wt 69.2 kg   SpO2 94%   BMI 20.69 kg/m   Pertinent Labs, Studies, and Procedures:   CMP Latest  Ref Rng & Units 01/24/2019 01/23/2019 11/09/2018  Glucose 70 - 99 mg/dL 82 97 92  BUN 8 - 23 mg/dL 20 26(H) 24(H)  Creatinine 0.61 - 1.24 mg/dL 1.19 1.22 1.45(H)  Sodium 135 - 145 mmol/L 137 136 137  Potassium 3.5 - 5.1 mmol/L 3.5 4.9 4.3  Chloride 98 - 111 mmol/L 108 111 103  CO2 22 - 32 mmol/L 18(L) 19(L) 26  Calcium 8.9 - 10.3 mg/dL 7.9(L) 7.7(L) 8.8(L)  Total Protein 6.5 - 8.1 g/dL - 5.7(L) 6.1(L)  Total Bilirubin 0.3 - 1.2 mg/dL - 1.6(H) 0.9  Alkaline Phos 38 - 126 U/L - 38 33(L)  AST 15 - 41 U/L - 30 12(L)  ALT 0 - 44 U/L - 8 8   Ct Head Wo Contrast  Result Date: 01/23/2019 CLINICAL DATA:  Altered level of  consciousness EXAM: CT HEAD WITHOUT CONTRAST TECHNIQUE: Contiguous axial images were obtained from the base of the skull through the vertex without intravenous contrast. COMPARISON:  10/05/2017 MRI FINDINGS: Brain: Chronic atrophic changes are again noted. Stable ventriculomegaly is seen. No findings to suggest acute hemorrhage, acute infarction or space-occupying mass lesion are noted. Vascular: No hyperdense vessel or unexpected calcification. Skull: Normal. Negative for fracture or focal lesion. Sinuses/Orbits: No acute finding. Other: None. IMPRESSION: Chronic atrophic changes stable from the previous exam. No acute intracranial abnormality noted. Electronically Signed   By: Inez Catalina M.D.   On: 01/23/2019 13:36   Ct Abdomen Pelvis W Contrast  Result Date: 01/23/2019 CLINICAL DATA:  Acute abdominal pain EXAM: CT ABDOMEN AND PELVIS WITH CONTRAST TECHNIQUE: Multidetector CT imaging of the abdomen and pelvis was performed using the standard protocol following bolus administration of intravenous contrast. CONTRAST:  175mL OMNIPAQUE IOHEXOL 300 MG/ML  SOLN COMPARISON:  None. FINDINGS: Lower chest: Minimal left basilar atelectasis is noted. Hepatobiliary: No focal liver abnormality is seen. No gallstones, gallbladder wall thickening, or biliary dilatation. Pancreas: Unremarkable. No pancreatic ductal dilatation or surrounding inflammatory changes. Spleen: Normal in size without focal abnormality. Adrenals/Urinary Tract: Adrenal glands are within normal limits. Kidneys are well visualized bilaterally. Bilateral renal cystic changes are noted. Focal renal calculi or obstructive changes are noted. Bladder is decompressed. Stomach/Bowel: Scattered diverticular changes noted without definitive diverticulitis. Colon is predominately fluid filled consistent with the given clinical history of diarrhea. The appendix is within normal limits. Small bowel demonstrates some mildly prominent loops within the mid abdomen with  mild fecalization of small bowel contents. This may be related to a delayed small bowel transit time and mild small bowel ileus. Correlation with the physical exam is recommended. No definitive obstructing lesion is seen. Vascular/Lymphatic: Aortic atherosclerosis. No enlarged abdominal or pelvic lymph nodes. Reproductive: Prostate is enlarged indenting upon the inferior aspect of the bladder. Other: No abdominal wall hernia or abnormality. No abdominopelvic ascites. Musculoskeletal: Degenerative changes of lumbar spine are noted. IMPRESSION: Fluid-filled colon consistent with the diarrheal state. Scattered mildly dilated loops of small bowel with fecalization of small bowel contrast. No definitive obstructive changes seen in this raises suspicion for ileus or very early partial small-bowel obstruction. Correlation with the physical exam is recommended. Chronic changes without acute abnormality. Electronically Signed   By: Inez Catalina M.D.   On: 01/23/2019 16:05    Discharge Instructions: Discharge Instructions    Diet - low sodium heart healthy   Complete by:  As directed    Increase activity slowly   Complete by:  As directed     Terry Taylor, Terry Taylor were hospitalized  due to dehydration from your diarrhea. Your diarrhea was most likely from a viral illness. I want you to follow up with your PCP in about one week to make sure your symptoms are continuing to improve.   Thank you for allowing [place] to be a part of your care!  Signed: Mike Craze, DO 01/24/2019, 12:50 PM

## 2019-01-24 NOTE — Evaluation (Signed)
Clinical/Bedside Swallow Evaluation Patient Details  Name: Terry Taylor MRN: [id number] Date of Birth: 18-Oct-1928  Today's Date: 01/24/2019 Time: SLP Start Time (ACUTE ONLY): 1500 SLP Stop Time (ACUTE ONLY): 1525 SLP Time Calculation (min) (ACUTE ONLY): 25 min  Past Medical History:  Past Medical History:  Diagnosis Date  . Anxiety   . Arthritis    "bad in his knees" (01/09/2017)  . Benign prostatic hypertrophy    hx  . Chronic airway obstruction, not elsewhere classified   . Chronic bronchitis ([place])   . Coronary atherosclerosis of unspecified type of vessel, native or graft   . GERD (gastroesophageal reflux disease)   . New onset atrial fibrillation ([place]) 01/08/2017   [name] 01/08/2017  . On home oxygen therapy    "prn" (01/09/2017)  . Osteoarthrosis, unspecified whether generalized or localized, unspecified site   . Parkinson's disease ([place])    with dementia and psychosis.   . Pneumonia    "more than once" (01/09/2017)  . Pure hypercholesterolemia    IIA  . Skin cancer of face   . Stroke [facility])    "they think he had a minor stroke 1-2 yr ago" (01/09/2017)  . Unspecified essential hypertension    Past Surgical History:  Past Surgical History:  Procedure Laterality Date  . CERVICAL DISCECTOMY    . FRACTURE SURGERY    . SKIN CANCER DESTRUCTION Right    "face"  . TIBIA FRACTURE SURGERY Right    "he's got a pin in there"   HPI:  Patient is a 85 y/o male presenting to the ED on 01/23/19 with primary complaints of generalized weakness. PMH signficant for CAD, COPD, and Parkinson's with dementia. Admitted for dehydration secondary to diarrhea.     Assessment / Plan / Recommendation Clinical Impression  Patient presents with a moderate oropharyngeal dysphagia with significant impact from cognitive dysfunction, as patient with very poor awareness to boluses and was easily distracted, requiring cues to initaite swallow. Sips of thin liquids helped activate swallow response when  patient holding solid boluses. Patient's daughter present and as plan is for him to discharge today, SLP provided her with verbal and written education of diet consistencies as well as plan to recommend [facility] SLP. SLP Visit Diagnosis: Dysphagia, unspecified (R13.10)    Aspiration Risk  Mild aspiration risk    Diet Recommendation Dysphagia 3 (Mech soft);Thin liquid   Liquid Administration via: Cup Medication Administration: Whole meds with puree Supervision: Full supervision/cueing for compensatory strategies Compensations: Minimize environmental distractions;Slow rate;Small sips/bites Postural Changes: Seated upright at 90 degrees    Other  Recommendations Oral Care Recommendations: Oral care BID   Follow up Recommendations Home health SLP      Frequency and Duration            Prognosis Prognosis for Safe Diet Advancement: Good      Swallow Study   General Date of Onset: 01/23/19 HPI: Patient is a 85 y/o male presenting to the ED on 01/23/19 with primary complaints of generalized weakness. PMH signficant for CAD, COPD, and Parkinson's with dementia. Admitted for dehydration secondary to diarrhea.   Type of Study: Bedside Swallow Evaluation Previous Swallow Assessment: N/A Diet Prior to this Study: Regular;Thin liquids Temperature Spikes Noted: No Respiratory Status: Room air History of Recent Intubation: No Behavior/Cognition: Alert;Cooperative;Pleasant mood;Confused;Requires cueing;Distractible Oral Cavity Assessment: Within Functional Limits Oral Care Completed by SLP: Yes Oral Cavity - Dentition: Dentures, top Self-Feeding Abilities: Needs assist;Able to feed self Patient Positioning: Upright in chair Baseline Vocal Quality:  Normal Volitional Cough: Weak Volitional Swallow: Unable to elicit    Oral/Motor/Sensory Function Overall Oral Motor/Sensory Function: Within functional limits   Ice Chips Ice chips: Not tested   Thin Liquid Thin Liquid: Impaired Presentation:  Cup;Straw Oral Phase Impairments: Poor awareness of bolus Pharyngeal  Phase Impairments: Suspected delayed Swallow    Nectar Thick     Honey Thick     Puree Puree: Impaired Oral Phase Impairments: Poor awareness of bolus;Reduced lingual movement/coordination Pharyngeal Phase Impairments: Suspected delayed Swallow   Solid     Solid: Impaired Oral Phase Impairments: Impaired mastication;Reduced lingual movement/coordination;Poor awareness of bolus Oral Phase Functional Implications: Prolonged oral transit Other Comments: very poor awareness to hard solid bolus       Sonia Baller, MA, CCC-SLP 01/24/19 6:38 PM

## 2019-01-24 NOTE — Discharge Instructions (Signed)
Mr. Zanden, Colver were hospitalized due to dehydration from your diarrhea. Your diarrhea was most likely from a viral illness. I want you to follow up with your PCP in about one week to make sure your symptoms are continuing to improve.   Thank you for allowing [place] to be a part of your care!

## 2019-01-24 NOTE — Care Management Note (Signed)
Case Management Note  Patient Details  Name: Terry Taylor MRN: [id number] Date of Birth: 02/23/1928  Subjective/Objective:                    Action/Plan:  Spoke w caregiver Amy and son "[name]" to discuss dispo. They would like to use [facility]. Discussed [facility] ratings. Referral accepted by [facility]. No DME needs.  Expected Discharge Date:  01/24/19               Expected Discharge Plan:  [place]  In-House Referral:     Discharge planning Services  CM Consult  Post Acute Care Choice:  Home Health Choice offered to:  Patient, Adult Children  DME Arranged:    DME Agency:     HH Arranged:  PT, RN [place] Agency:  [facility]  Status of Service:  Completed, signed off  If discussed at [place] of Stay Meetings, dates discussed:    Additional Comments:  Carles Collet, RN 01/24/2019, 2:03 PM

## 2019-01-24 NOTE — Progress Notes (Signed)
   Subjective: Patient is much more alert this AM. States that he feels awful. Feels achy but no localized pain. States that he is continuing to have diarrhea. None documented in the chart. Per aide, mental status fluctuates. State that yesterday prior to the ambulance ride he took some Seroquel and that is why he was so sleepy. She feels that he is back to his normal. Discussed that we have given him some fluid to help with his hydration. Discussed possible discharge later today. Family wants home health.   Objective: Vital signs in last 24 hours: Vitals:   01/23/19 1730 01/23/19 1755 01/23/19 2140 01/24/19 0438  BP: (!) 141/65 (!) 103/51 (!) 104/58 (!) 148/78  Pulse:  62 (!) 55 70  Resp: 17 18 18 18   Temp:  97.8 F (36.6 C) 97.7 F (36.5 C) 98.8 F (37.1 C)  TempSrc:  Oral  Oral  SpO2:  100% 93% 94%  Weight:  69.2 kg    Height:  6' (1.829 m)     Gen: sitting up in the chair, no distress Abdomen: distended, bowel sounds present, no TTP CV: RRR, no murmurs Skin: dry, + skin turgor   Assessment/Plan:  Active Problems:   Dehydration  Terry Taylor is an 85 y.o male with CAD, COPD, and Parkinson's with dementia who presented to the ED with generalized weakness. Since Monday has had progressive watery diarrhea, having a BM every 2 hours.   Dehydration - Patient has not had a BM since admission - Will continue to hydrate with LR 1L   CAD, nonobstructive - continue Imdur 15 mg daily   Parkinson's with dementia - Continue Quetiapine 25 mg daily and carbidopa-levodopa bid   BPH - Continue tamulosin  Dispo: Anticipated discharge is today. Will hydrate patient with IVF and if he is feeling okay this afternoon he will be stable for discharge.  Mike Craze, DO 01/24/2019, 6:48 AM Pager: [phone]

## 2019-01-24 NOTE — Progress Notes (Signed)
  Date: 01/24/2019  Patient name: Terry Taylor  Medical record number: [id number]  Date of birth: Jul 11, 1928   I have seen and evaluated Ledora Bottcher and discussed their care with the Residency Team. Briefly, Mr. Gilham is an 85 year old man who developed an acute diarrheal illness, due to a sick contact.  He became dehydrated due to the frequent diarrhea.  He has done well in the hospital, and diarrhea has ceased.  It ceased prior to [place] being able to check any stool studies, unlikely to be Cdiff with this history.  Renal function has been stable.  Family would prefer to have him stay at home with home health if needed.    Vitals:   01/24/19 0438 01/24/19 1413  BP: (!) 148/78 130/76  Pulse: 70 76  Resp: 18 17  Temp: 98.8 F (37.1 C) 98.6 F (37 C)  SpO2: 94% 97%   General: Elderly gentleman, sitting in chair, hard of hearing Eyes: He has a left sided swollen bottom eyelid, non painful, no icterus HENT: Supple, dry MM CV: RR, NR, no murmur Pulm: CTAB, no wheezing, no increased WOB Abd: Soft, +BS, NT, mildly distended Skin: Skin is very dry and flaking, + increased skin turgor Psych: Mood is down Neuro: Alert, oriented to person only, thinks he is at home.   EKG: NSR  Assessment and Plan: I have seen and evaluated the patient as outlined above. I agree with the formulated Assessment and Plan as detailed in the residents' note, with the following changes:   1. Acute diarrheal illness, likely viral - Continue fluid administration for dehydration - If further BM, would collect GIP panel and Cdiff - PT evaluation - Further treatment based on continuation of diarrhea  2. Dehydration - Encourage PO Intake - Renal function stable - IVF as per Dr. Olevia Perches note  Likely able to be discharged today if doing well in the afternoon.   Sid Falcon, MD 1/25/20208:40 PM

## 2019-01-26 DIAGNOSIS — Z9981 Dependence on supplemental oxygen: Secondary | ICD-10-CM | POA: Diagnosis not present

## 2019-01-26 DIAGNOSIS — R531 Weakness: Secondary | ICD-10-CM | POA: Diagnosis not present

## 2019-01-26 DIAGNOSIS — A084 Viral intestinal infection, unspecified: Secondary | ICD-10-CM | POA: Diagnosis not present

## 2019-01-26 DIAGNOSIS — G20 Parkinson's disease: Secondary | ICD-10-CM | POA: Diagnosis not present

## 2019-01-26 DIAGNOSIS — J449 Chronic obstructive pulmonary disease, unspecified: Secondary | ICD-10-CM | POA: Diagnosis not present

## 2019-01-26 DIAGNOSIS — F0281 Dementia in other diseases classified elsewhere with behavioral disturbance: Secondary | ICD-10-CM | POA: Diagnosis not present

## 2019-01-26 DIAGNOSIS — Z79899 Other long term (current) drug therapy: Secondary | ICD-10-CM | POA: Diagnosis not present

## 2019-01-28 ENCOUNTER — Encounter (HOSPITAL_COMMUNITY): Payer: Self-pay | Admitting: Emergency Medicine

## 2019-01-28 ENCOUNTER — Emergency Department (HOSPITAL_COMMUNITY): Payer: Medicare HMO

## 2019-01-28 ENCOUNTER — Inpatient Hospital Stay (HOSPITAL_COMMUNITY)
Admission: EM | Admit: 2019-01-28 | Discharge: 2019-02-10 | DRG: 391 | Disposition: A | Discharge status: Hospice | Payer: Medicare HMO | Attending: Internal Medicine | Admitting: Internal Medicine

## 2019-01-28 ENCOUNTER — Inpatient Hospital Stay (HOSPITAL_COMMUNITY): Payer: Medicare HMO

## 2019-01-28 DIAGNOSIS — R140 Abdominal distension (gaseous): Secondary | ICD-10-CM | POA: Diagnosis not present

## 2019-01-28 DIAGNOSIS — F0280 Dementia in other diseases classified elsewhere without behavioral disturbance: Secondary | ICD-10-CM | POA: Diagnosis present

## 2019-01-28 DIAGNOSIS — Z79890 Hormone replacement therapy (postmenopausal): Secondary | ICD-10-CM

## 2019-01-28 DIAGNOSIS — K567 Ileus, unspecified: Secondary | ICD-10-CM | POA: Diagnosis not present

## 2019-01-28 DIAGNOSIS — Z515 Encounter for palliative care: Secondary | ICD-10-CM | POA: Diagnosis not present

## 2019-01-28 DIAGNOSIS — Z7983 Long term (current) use of bisphosphonates: Secondary | ICD-10-CM

## 2019-01-28 DIAGNOSIS — Z66 Do not resuscitate: Secondary | ICD-10-CM | POA: Diagnosis not present

## 2019-01-28 DIAGNOSIS — I2510 Atherosclerotic heart disease of native coronary artery without angina pectoris: Secondary | ICD-10-CM | POA: Diagnosis present

## 2019-01-28 DIAGNOSIS — I480 Paroxysmal atrial fibrillation: Secondary | ICD-10-CM | POA: Diagnosis not present

## 2019-01-28 DIAGNOSIS — Z79899 Other long term (current) drug therapy: Secondary | ICD-10-CM | POA: Diagnosis not present

## 2019-01-28 DIAGNOSIS — N400 Enlarged prostate without lower urinary tract symptoms: Secondary | ICD-10-CM | POA: Diagnosis not present

## 2019-01-28 DIAGNOSIS — K5730 Diverticulosis of large intestine without perforation or abscess without bleeding: Secondary | ICD-10-CM | POA: Diagnosis not present

## 2019-01-28 DIAGNOSIS — K560 Paralytic ileus: Secondary | ICD-10-CM | POA: Diagnosis not present

## 2019-01-28 DIAGNOSIS — J449 Chronic obstructive pulmonary disease, unspecified: Secondary | ICD-10-CM | POA: Diagnosis not present

## 2019-01-28 DIAGNOSIS — E860 Dehydration: Secondary | ICD-10-CM | POA: Diagnosis not present

## 2019-01-28 DIAGNOSIS — N179 Acute kidney failure, unspecified: Secondary | ICD-10-CM | POA: Diagnosis present

## 2019-01-28 DIAGNOSIS — J9621 Acute and chronic respiratory failure with hypoxia: Secondary | ICD-10-CM | POA: Diagnosis not present

## 2019-01-28 DIAGNOSIS — K529 Noninfective gastroenteritis and colitis, unspecified: Secondary | ICD-10-CM | POA: Diagnosis present

## 2019-01-28 DIAGNOSIS — Z87891 Personal history of nicotine dependence: Secondary | ICD-10-CM | POA: Diagnosis not present

## 2019-01-28 DIAGNOSIS — K56609 Unspecified intestinal obstruction, unspecified as to partial versus complete obstruction: Secondary | ICD-10-CM | POA: Diagnosis present

## 2019-01-28 DIAGNOSIS — Z452 Encounter for adjustment and management of vascular access device: Secondary | ICD-10-CM | POA: Diagnosis not present

## 2019-01-28 DIAGNOSIS — Z91048 Other nonmedicinal substance allergy status: Secondary | ICD-10-CM | POA: Diagnosis not present

## 2019-01-28 DIAGNOSIS — J9601 Acute respiratory failure with hypoxia: Secondary | ICD-10-CM | POA: Diagnosis not present

## 2019-01-28 DIAGNOSIS — Z85828 Personal history of other malignant neoplasm of skin: Secondary | ICD-10-CM | POA: Diagnosis not present

## 2019-01-28 DIAGNOSIS — R0902 Hypoxemia: Secondary | ICD-10-CM | POA: Diagnosis not present

## 2019-01-28 DIAGNOSIS — Z91030 Bee allergy status: Secondary | ICD-10-CM

## 2019-01-28 DIAGNOSIS — F0281 Dementia in other diseases classified elsewhere with behavioral disturbance: Secondary | ICD-10-CM | POA: Diagnosis not present

## 2019-01-28 DIAGNOSIS — F0390 Unspecified dementia without behavioral disturbance: Secondary | ICD-10-CM | POA: Diagnosis present

## 2019-01-28 DIAGNOSIS — Z0189 Encounter for other specified special examinations: Secondary | ICD-10-CM

## 2019-01-28 DIAGNOSIS — G20 Parkinson's disease: Secondary | ICD-10-CM | POA: Diagnosis not present

## 2019-01-28 DIAGNOSIS — R0603 Acute respiratory distress: Secondary | ICD-10-CM

## 2019-01-28 DIAGNOSIS — K117 Disturbances of salivary secretion: Secondary | ICD-10-CM

## 2019-01-28 DIAGNOSIS — F419 Anxiety disorder, unspecified: Secondary | ICD-10-CM | POA: Diagnosis present

## 2019-01-28 DIAGNOSIS — E876 Hypokalemia: Secondary | ICD-10-CM | POA: Diagnosis not present

## 2019-01-28 DIAGNOSIS — I10 Essential (primary) hypertension: Secondary | ICD-10-CM | POA: Diagnosis present

## 2019-01-28 DIAGNOSIS — K219 Gastro-esophageal reflux disease without esophagitis: Secondary | ICD-10-CM | POA: Diagnosis present

## 2019-01-28 DIAGNOSIS — Z833 Family history of diabetes mellitus: Secondary | ICD-10-CM

## 2019-01-28 DIAGNOSIS — R197 Diarrhea, unspecified: Secondary | ICD-10-CM | POA: Diagnosis not present

## 2019-01-28 DIAGNOSIS — N281 Cyst of kidney, acquired: Secondary | ICD-10-CM | POA: Diagnosis not present

## 2019-01-28 DIAGNOSIS — I959 Hypotension, unspecified: Secondary | ICD-10-CM | POA: Diagnosis not present

## 2019-01-28 DIAGNOSIS — K56600 Partial intestinal obstruction, unspecified as to cause: Secondary | ICD-10-CM

## 2019-01-28 DIAGNOSIS — I2583 Coronary atherosclerosis due to lipid rich plaque: Secondary | ICD-10-CM | POA: Diagnosis not present

## 2019-01-28 DIAGNOSIS — R451 Restlessness and agitation: Secondary | ICD-10-CM

## 2019-01-28 DIAGNOSIS — R935 Abnormal findings on diagnostic imaging of other abdominal regions, including retroperitoneum: Secondary | ICD-10-CM | POA: Diagnosis not present

## 2019-01-28 DIAGNOSIS — Z823 Family history of stroke: Secondary | ICD-10-CM

## 2019-01-28 DIAGNOSIS — R0602 Shortness of breath: Secondary | ICD-10-CM | POA: Diagnosis not present

## 2019-01-28 DIAGNOSIS — Z8673 Personal history of transient ischemic attack (TIA), and cerebral infarction without residual deficits: Secondary | ICD-10-CM

## 2019-01-28 DIAGNOSIS — Z9981 Dependence on supplemental oxygen: Secondary | ICD-10-CM

## 2019-01-28 DIAGNOSIS — Z933 Colostomy status: Secondary | ICD-10-CM | POA: Diagnosis not present

## 2019-01-28 DIAGNOSIS — G20A1 Parkinson's disease without dyskinesia, without mention of fluctuations: Secondary | ICD-10-CM | POA: Diagnosis present

## 2019-01-28 DIAGNOSIS — K8020 Calculus of gallbladder without cholecystitis without obstruction: Secondary | ICD-10-CM | POA: Diagnosis not present

## 2019-01-28 LAB — COMPREHENSIVE METABOLIC PANEL
ALBUMIN: 3.2 g/dL — AB (ref 3.5–5.0)
ALK PHOS: 48 U/L (ref 38–126)
ALT: 8 U/L (ref 0–44)
AST: 13 U/L — ABNORMAL LOW (ref 15–41)
Anion gap: 13 (ref 5–15)
BUN: 19 mg/dL (ref 8–23)
CO2: 20 mmol/L — ABNORMAL LOW (ref 22–32)
Calcium: 8.5 mg/dL — ABNORMAL LOW (ref 8.9–10.3)
Chloride: 105 mmol/L (ref 98–111)
Creatinine, Ser: 1.3 mg/dL — ABNORMAL HIGH (ref 0.61–1.24)
GFR calc Af Amer: 56 mL/min — ABNORMAL LOW (ref >60)
GFR calc non Af Amer: 49 mL/min — ABNORMAL LOW (ref >60)
GLUCOSE: 151 mg/dL — AB (ref 70–99)
Potassium: 3.3 mmol/L — ABNORMAL LOW (ref 3.5–5.1)
Sodium: 138 mmol/L (ref 135–145)
Total Bilirubin: 0.9 mg/dL (ref 0.3–1.2)
Total Protein: 6.6 g/dL (ref 6.5–8.1)

## 2019-01-28 LAB — CBC WITH DIFFERENTIAL/PLATELET
Abs Immature Granulocytes: 0.02 10*3/uL (ref 0.00–0.07)
Basophils Absolute: 0 10*3/uL (ref 0.0–0.1)
Basophils Relative: 0 %
Eosinophils Absolute: 0.2 10*3/uL (ref 0.0–0.5)
Eosinophils Relative: 3 %
HEMATOCRIT: 48.5 % (ref 39.0–52.0)
Hemoglobin: 15.9 g/dL (ref 13.0–17.0)
Immature Granulocytes: 0 %
LYMPHS ABS: 2.2 10*3/uL (ref 0.7–4.0)
LYMPHS PCT: 31 %
MCH: 27.9 pg (ref 26.0–34.0)
MCHC: 32.8 g/dL (ref 30.0–36.0)
MCV: 85.2 fL (ref 80.0–100.0)
Monocytes Absolute: 0.7 10*3/uL (ref 0.1–1.0)
Monocytes Relative: 10 %
Neutro Abs: 3.9 10*3/uL (ref 1.7–7.7)
Neutrophils Relative %: 56 %
Platelets: 251 10*3/uL (ref 150–400)
RBC: 5.69 MIL/uL (ref 4.22–5.81)
RDW: 15.9 % — ABNORMAL HIGH (ref 11.5–15.5)
WBC: 7.1 10*3/uL (ref 4.0–10.5)
nRBC: 0 % (ref 0.0–0.2)

## 2019-01-28 LAB — POCT I-STAT EG7
Acid-base deficit: 4 mmol/L — ABNORMAL HIGH (ref 0.0–2.0)
BICARBONATE: 21 mmol/L (ref 20.0–28.0)
Calcium, Ion: 1.18 mmol/L (ref 1.15–1.40)
HEMATOCRIT: 47 % (ref 39.0–52.0)
Hemoglobin: 16 g/dL (ref 13.0–17.0)
O2 Saturation: 71 %
Potassium: 3.3 mmol/L — ABNORMAL LOW (ref 3.5–5.1)
Sodium: 139 mmol/L (ref 135–145)
TCO2: 22 mmol/L (ref 22–32)
pCO2, Ven: 39.5 mmHg — ABNORMAL LOW (ref 44.0–60.0)
pH, Ven: 7.334 (ref 7.250–7.430)
pO2, Ven: 39 mmHg (ref 32.0–45.0)

## 2019-01-28 LAB — GASTROINTESTINAL PANEL BY PCR, STOOL (REPLACES STOOL CULTURE)

## 2019-01-28 LAB — TROPONIN I: Troponin I: <0.03 ng/mL (ref <0.03)

## 2019-01-28 LAB — AMMONIA: Ammonia: 38 umol/L — ABNORMAL HIGH (ref 9–35)

## 2019-01-28 LAB — BRAIN NATRIURETIC PEPTIDE: B Natriuretic Peptide: 183.7 pg/mL — ABNORMAL HIGH (ref 0.0–100.0)

## 2019-01-28 LAB — C DIFFICILE QUICK SCREEN W PCR REFLEX
C Diff antigen: NEGATIVE
C Diff interpretation: NOT DETECTED
C Diff toxin: NEGATIVE

## 2019-01-28 LAB — LACTIC ACID, PLASMA: Lactic Acid, Venous: 1.3 mmol/L (ref 0.5–1.9)

## 2019-01-28 MED ORDER — SODIUM CHLORIDE 0.9% FLUSH
3.0000 mL | Freq: Two times a day (BID) | INTRAVENOUS | Status: DC
  Administered 2019-01-28 – 2019-02-08 (×15): 3 mL via INTRAVENOUS

## 2019-01-28 MED ORDER — IPRATROPIUM-ALBUTEROL 0.5-2.5 (3) MG/3ML IN SOLN
3.0000 mL | Freq: Once | RESPIRATORY_TRACT | Status: AC
  Administered 2019-01-28: 3 mL via RESPIRATORY_TRACT
  Filled 2019-01-28: qty 3

## 2019-01-28 MED ORDER — ONDANSETRON HCL 4 MG/2ML IJ SOLN: 4.0000 mg | Freq: Four times a day (QID) | INTRAMUSCULAR | Status: DC | PRN

## 2019-01-28 MED ORDER — TAMSULOSIN HCL 0.4 MG PO CAPS
0.4000 mg | ORAL_CAPSULE | Freq: Every day | ORAL | Status: DC
  Administered 2019-01-28 – 2019-02-10 (×10): 0.4 mg via ORAL
  Filled 2019-01-28 (×11): qty 1

## 2019-01-28 MED ORDER — QUETIAPINE FUMARATE 50 MG PO TABS
25.0000 mg | ORAL_TABLET | Freq: Every day | ORAL | Status: DC
  Administered 2019-01-28 – 2019-02-08 (×8): 25 mg via ORAL
  Filled 2019-01-28 (×10): qty 1

## 2019-01-28 MED ORDER — LACTATED RINGERS IV BOLUS
500.0000 mL | Freq: Once | INTRAVENOUS | Status: AC
  Administered 2019-01-28: 500 mL via INTRAVENOUS

## 2019-01-28 MED ORDER — ACETAMINOPHEN 325 MG PO TABS
650.0000 mg | ORAL_TABLET | Freq: Four times a day (QID) | ORAL | Status: DC | PRN
  Administered 2019-01-29 – 2019-02-09 (×3): 650 mg via ORAL
  Filled 2019-01-28 (×4): qty 2

## 2019-01-28 MED ORDER — IOHEXOL 300 MG/ML  SOLN
100.0000 mL | Freq: Once | INTRAMUSCULAR | Status: AC | PRN
  Administered 2019-01-28: 100 mL via INTRAVENOUS

## 2019-01-28 MED ORDER — POTASSIUM CHLORIDE 2 MEQ/ML IV SOLN
INTRAVENOUS | Status: DC
  Administered 2019-01-28 – 2019-01-30 (×4): via INTRAVENOUS
  Filled 2019-01-28 (×8): qty 1000

## 2019-01-28 MED ORDER — CARBIDOPA-LEVODOPA 25-100 MG PO TABS
1.0000 | ORAL_TABLET | Freq: Two times a day (BID) | ORAL | Status: DC
  Administered 2019-01-28 – 2019-02-09 (×20): 1 via ORAL
  Filled 2019-01-28 (×22): qty 1

## 2019-01-28 MED ORDER — NAPHAZOLINE-GLYCERIN 0.012-0.2 % OP SOLN
1.0000 [drp] | Freq: Four times a day (QID) | OPHTHALMIC | Status: DC | PRN
  Administered 2019-01-28 – 2019-01-29 (×2): 1 [drp] via OPHTHALMIC
  Administered 2019-01-31 (×2): 2 [drp] via OPHTHALMIC
  Administered 2019-02-05: 1 [drp] via OPHTHALMIC
  Filled 2019-01-28 (×2): qty 15

## 2019-01-28 MED ORDER — ACETAMINOPHEN 650 MG RE SUPP: 650.0000 mg | Freq: Four times a day (QID) | RECTAL | Status: DC | PRN

## 2019-01-28 MED ORDER — ENOXAPARIN SODIUM 40 MG/0.4ML ~~LOC~~ SOLN
40.0000 mg | SUBCUTANEOUS | Status: DC
  Administered 2019-01-29 – 2019-02-08 (×11): 40 mg via SUBCUTANEOUS
  Filled 2019-01-28 (×12): qty 0.4

## 2019-01-28 MED ORDER — DIATRIZOATE MEGLUMINE & SODIUM 66-10 % PO SOLN
90.0000 mL | Freq: Once | ORAL | Status: DC
  Filled 2019-01-28: qty 90

## 2019-01-28 MED ORDER — ONDANSETRON HCL 4 MG PO TABS: 4.0000 mg | ORAL_TABLET | Freq: Four times a day (QID) | ORAL | Status: DC | PRN

## 2019-01-28 MED ORDER — LOPERAMIDE HCL 2 MG PO CAPS
2.0000 mg | ORAL_CAPSULE | Freq: Two times a day (BID) | ORAL | Status: DC
  Administered 2019-01-28 – 2019-01-30 (×4): 2 mg via ORAL
  Filled 2019-01-28 (×6): qty 1

## 2019-01-28 MED ORDER — SODIUM CHLORIDE 0.9 % IV BOLUS
500.0000 mL | Freq: Once | INTRAVENOUS | Status: AC
  Administered 2019-01-28: 500 mL via INTRAVENOUS

## 2019-01-28 NOTE — Progress Notes (Signed)
Pt given break from Bipap as tolerated. Pt placed on 4L nasal cannula. Vital signs stable. Pt and family verbalize understanding that if SOB increases or pt unable to maintain SpO2 Bipap will be placed back on. RT will continue to monitor.

## 2019-01-28 NOTE — ED Provider Notes (Signed)
[facility] EMERGENCY DEPARTMENT Provider Note   CSN: [id number] Arrival date & time: 01/28/19  1248     History   Chief Complaint Chief Complaint  Patient presents with  . Respiratory Distress    HPI Terry Taylor is a 85 y.o. male.  HPI 85 yo M with PMHx COPD, CAD, AFib, dementia, CHF here with SOB. History limited 2/2 dementia. Per report, EMS called out 2/2 SOB and increased WOB. Pt has caregiver who called for EMS. Pt was found to be hypoxic to 80s with increased WOB. He was placed on CPAP en route w/ improvement. He states he feels "a little" better. Denies any other complaints.   Level 5 caveat invoked as remainder of history, ROS, and physical exam limited due to patient's resp distress, dementia.   Past Medical History:  Diagnosis Date  . Anxiety   . Arthritis    "bad in his knees" (01/09/2017)  . Benign prostatic hypertrophy    hx  . Chronic airway obstruction, not elsewhere classified   . Chronic bronchitis ([place])   . Coronary atherosclerosis of unspecified type of vessel, native or graft   . GERD (gastroesophageal reflux disease)   . New onset atrial fibrillation ([place]) 01/08/2017   [name] 01/08/2017  . On home oxygen therapy    "prn" (01/09/2017)  . Osteoarthrosis, unspecified whether generalized or localized, unspecified site   . Parkinson's disease ([place])    with dementia and psychosis.   . Pneumonia    "more than once" (01/09/2017)  . Pure hypercholesterolemia    IIA  . Skin cancer of face   . Stroke [facility])    "they think he had a minor stroke 1-2 yr ago" (01/09/2017)  . Unspecified essential hypertension     Patient Active Problem List   Diagnosis Date Noted  . Abdominal distention 01/28/2019  . Viral gastroenteritis 01/24/2019  . Diarrhea   . Dehydration 01/23/2019  . Goals of care, counseling/discussion   . Palliative care encounter   . Pressure injury of skin 10/09/2017  . Polypharmacy   . Supplemental oxygen dependent     . PAF (paroxysmal atrial fibrillation) ([place])   . Benign essential HTN   . Parkinson disease ([place])   . Hypoalbuminemia due to protein-calorie malnutrition ([place])   . Overdose 10/04/2017  . Altered mental status 10/04/2017  . UTI (urinary tract infection) 10/04/2017  . Anemia 10/04/2017  . Renal insufficiency 10/04/2017  . Paroxysmal A-fib ([place]) 01/11/2017  . Influenza A 01/09/2017  . COPD (chronic obstructive pulmonary disease) ([place]) 01/08/2017  . Dementia ([place]) 09/13/2016  . Chest pain, rule out acute myocardial infarction 09/12/2016  . Fever 03/07/2013  . Acute encephalopathy 03/07/2013  . Weakness generalized 03/07/2013  . HYPERCHOLESTEROLEMIA  IIA 04/09/2009  . Essential hypertension 04/09/2009  . Coronary artery disease due to lipid rich plaque 04/09/2009  . COPD exacerbation ([place]) 04/09/2009  . OSTEOARTHRITIS 04/09/2009  . BENIGN PROSTATIC HYPERTROPHY, HX OF 04/09/2009    Past Surgical History:  Procedure Laterality Date  . CERVICAL DISCECTOMY    . FRACTURE SURGERY    . SKIN CANCER DESTRUCTION Right    "face"  . TIBIA FRACTURE SURGERY Right    "he's got a pin in there"        Home Medications    Prior to Admission medications   Medication Sig Start Date End Date Taking? Authorizing Provider  acetaminophen (TYLENOL) 500 MG tablet Take 500 mg by mouth at bedtime.   Yes [provider]  carbidopa-levodopa (SINEMET IR) 25-100 MG tablet Take 1 tablet by mouth 2 (two) times daily.   Yes [provider]  CRANBERRY PO Take 1 tablet by mouth daily.   Yes [provider]  isosorbide mononitrate (IMDUR) 30 MG 24 hr tablet Take 0.5 tablets (15 mg total) by mouth daily. 09/13/16  Yes Ghimire, Henreitta Leber, MD  loperamide (IMODIUM A-D) 2 MG tablet Take 2 mg by mouth 4 (four) times daily as needed for diarrhea or loose stools.   Yes [provider]  Melatonin 3 MG TABS Take 3-6 mg by mouth at bedtime.   Yes [provider]   naphazoline-glycerin (CLEAR EYES) 0.012-0.2 % SOLN Place 1-2 drops into both eyes 4 (four) times daily as needed for eye irritation. Patient taking differently: Place 1-2 drops into both eyes See admin instructions. Instill 1-2 drops into both eyes at bedtime for redness, and as needed daily for irritation 10/09/17  Yes Rai, Ripudeep K, MD  NON FORMULARY Take 1 capsule by mouth See admin instructions. OnGuard Immune Defense softgel capsules: Take 1 capsule by mouth once a day   Yes [provider]  NON FORMULARY Take 1 packet by mouth See admin instructions. Melaleuca AM and PM Longevity vitamin packets: Take 1 packet by mouth in the morning and 1 packet in the evening   Yes [provider]  QUEtiapine (SEROQUEL) 25 MG tablet Take 25 mg by mouth See admin instructions. Take 25 mg by mouth at bedtime and an additional 25 mg in the morning if hallucinating 12/26/18  Yes [provider]  tamsulosin (FLOMAX) 0.4 MG CAPS capsule Take 0.4 mg by mouth daily.   Yes [provider]  feeding supplement, ENSURE ENLIVE, (ENSURE ENLIVE) LIQD Take 237 mLs by mouth 2 (two) times daily between meals. Patient not taking: Reported on 01/23/2019 10/12/17   Mendel Corning, MD    Family History Family History  Problem Relation Age of Onset  . Stroke Mother   . Stroke Father   . Diabetes Brother   . Other Daughter        adopted    Social History Social History   Tobacco Use  . Smoking status: Former Smoker    Years: 40.00    Types: Cigarettes    Last attempt to quit: 1979    Years since quitting: 41.1  . Smokeless tobacco: Never Used  Substance Use Topics  . Alcohol use: Not Currently  . Drug use: No     Allergies   Bee venom and Adhesive [tape]   Review of Systems Review of Systems  Unable to perform ROS: Dementia     Physical Exam Updated Vital Signs BP 118/73   Pulse 71   Resp 19   SpO2 99%   Physical Exam Vitals signs and nursing note  reviewed.  Constitutional:      General: He is not in acute distress.    Appearance: He is well-developed. He is ill-appearing.  HENT:     Head: Normocephalic and atraumatic.  Eyes:     Conjunctiva/sclera: Conjunctivae normal.  Neck:     Musculoskeletal: Neck supple.  Cardiovascular:     Rate and Rhythm: Normal rate and regular rhythm.     Heart sounds: Normal heart sounds. No murmur. No friction rub.  Pulmonary:     Effort: Pulmonary effort is normal. No respiratory distress.     Breath sounds: Examination of the right-upper field reveals rales. Examination of the left-upper field reveals rales. Examination  of the right-middle field reveals rales. Examination of the left-middle field reveals rales. Examination of the right-lower field reveals rhonchi and rales. Examination of the left-lower field reveals rhonchi and rales. Decreased breath sounds, rhonchi and rales present. No wheezing.  Abdominal:     General: Abdomen is protuberant. There is distension.     Palpations: Abdomen is soft.     Tenderness: There is no abdominal tenderness.  Skin:    General: Skin is warm.     Capillary Refill: Capillary refill takes less than 2 seconds.  Neurological:     Mental Status: He is alert and oriented to person, place, and time.     Motor: No abnormal muscle tone.      ED Treatments / Results  Labs (all labs ordered are listed, but only abnormal results are displayed) Labs Reviewed  CBC WITH DIFFERENTIAL/PLATELET - Abnormal; Notable for the following components:      Result Value   RDW 15.9 (*)    All other components within normal limits  COMPREHENSIVE METABOLIC PANEL - Abnormal; Notable for the following components:   Potassium 3.3 (*)    CO2 20 (*)    Glucose, Bld 151 (*)    Creatinine, Ser 1.30 (*)    Calcium 8.5 (*)    Albumin 3.2 (*)    AST 13 (*)    GFR calc non Af Amer 49 (*)    GFR calc Af Amer 56 (*)    All other components within normal limits  BRAIN NATRIURETIC  PEPTIDE - Abnormal; Notable for the following components:   B Natriuretic Peptide 183.7 (*)    All other components within normal limits  AMMONIA - Abnormal; Notable for the following components:   Ammonia 38 (*)    All other components within normal limits  POCT I-STAT EG7 - Abnormal; Notable for the following components:   pCO2, Ven 39.5 (*)    Acid-base deficit 4.0 (*)    Potassium 3.3 (*)    All other components within normal limits  GASTROINTESTINAL PANEL BY PCR, STOOL (REPLACES STOOL CULTURE)  C DIFFICILE QUICK SCREEN W PCR REFLEX  OVA + PARASITE EXAM  TROPONIN I  LACTIC ACID, PLASMA  BASIC METABOLIC PANEL    EKG None  Radiology Ct Abdomen Pelvis W Contrast  Result Date: 01/28/2019 CLINICAL DATA:  Abd Distension. EXAM: CT ABDOMEN AND PELVIS WITH CONTRAST TECHNIQUE: Multidetector CT imaging of the abdomen and pelvis was performed using the standard protocol following bolus administration of intravenous contrast. CONTRAST:  150mL OMNIPAQUE IOHEXOL 300 MG/ML  SOLN COMPARISON:  01/23/2019 FINDINGS: Lower chest: Trace bilateral pleural effusions. No pericardial effusion. Dependent atelectasis posteriorly at the lung bases. Hepatobiliary: No focal liver lesion. Subcentimeter layering probable stones in the dependent aspect of the gallbladder. Gallbladder is distended without convincing wall thickening or adjacent inflammatory/edematous change. No biliary ductal dilatation. Pancreas: Mild parenchymal atrophy without mass or ductal dilatation. Spleen: Normal in size without focal abnormality. Adrenals/Urinary Tract: Normal adrenals. 2.8 cm probable exophytic cyst from the right kidney upper pole. Subcentimeter probable cyst from the lower pole left kidney. 2.2 cm fluid attenuation thin-walled process medial to the right renal collecting system, as before. No hydronephrosis. Urinary bladder incompletely distended. Stomach/Bowel: Marked gastric distention by gas and fluid. The duodenum is  nondilated. Multiple fluid distended proximal and mid small bowel loops. The distal and terminal ileum is relatively decompressed. There is no discrete transition zone. Normal appendix. Lipomatous ileocecal valve. There is mild fluid distention of the colon  without wall thickening. A few scattered distal descending and proximal sigmoid diverticula without adjacent inflammatory/edematous change. Mild fluid distention of the rectum with rectal tube in place. Vascular/Lymphatic: Moderate patchy aortoiliac arterial calcifications without aneurysm or high-grade stenosis. No abdominal or pelvic adenopathy. Reproductive: There is marked prostatic enlargement measured up to 8.1 cm transverse diameter, with cephalad displacement of the urinary bladder. Other: Fluid in the right inguinal canal. No abdominal ascites. No free air. Musculoskeletal: Multilevel spondylitic change in the visualized lower thoracic and lumbar spine. No fracture or worrisome bone lesion. IMPRESSION: 1. Distended stomach and proximal and mid small bowel with decompressed distal and terminal ileum, no discrete transition zone, fluid distended colon, favoring adynamic ileus over partial small bowel obstruction. 2. Cholelithiasis. 3. Bilateral renal cysts. 4. Marked prostatic enlargement. 5. Trace bilateral pleural effusions. 6. Descending and proximal sigmoid diverticulosis. 7.  Aortic Atherosclerosis (ICD10-170.0). Electronically Signed   By: Lucrezia Europe M.D.   On: 01/28/2019 17:22   Dg Chest Portable 1 View  Result Date: 01/28/2019 CLINICAL DATA:  Shortness of breath EXAM: PORTABLE CHEST 1 VIEW COMPARISON:  11/09/2018 FINDINGS: Cardiac shadows within normal limits. The lungs are hypoinflated but clear. No acute bony abnormality is noted. Degenerative changes about shoulder joints are noted. IMPRESSION: No acute abnormality noted. Electronically Signed   By: Inez Catalina M.D.   On: 01/28/2019 13:47   Dg Abd Portable 1 View  Result Date:  01/28/2019 CLINICAL DATA:  Abdominal distension EXAM: PORTABLE ABDOMEN - 1 VIEW COMPARISON:  01/23/2019 FINDINGS: Scattered large and small bowel gas is noted. Mild small bowel dilatation is again seen similar to that noted on prior CT. Degenerative changes of lumbar spine are noted. IMPRESSION: Stable small bowel dilatation. Electronically Signed   By: Inez Catalina M.D.   On: 01/28/2019 13:48    Procedures .Critical Care Performed by: Duffy Bruce, MD Authorized by: Duffy Bruce, MD   Critical care provider statement:    Critical care time (minutes):  35   Critical care time was exclusive of:  Separately billable procedures and treating other patients and teaching time   Critical care was necessary to treat or prevent imminent or life-threatening deterioration of the following conditions:  Cardiac failure, circulatory failure and respiratory failure   Critical care was time spent personally by me on the following activities:  Development of treatment plan with patient or surrogate, discussions with consultants, evaluation of patient's response to treatment, examination of patient, obtaining history from patient or surrogate, ordering and performing treatments and interventions, ordering and review of laboratory studies, ordering and review of radiographic studies, pulse oximetry, re-evaluation of patient's condition and review of old charts   I assumed direction of critical care for this patient from another provider in my specialty: no     (including critical care time)  Medications Ordered in ED Medications  QUEtiapine (SEROQUEL) tablet 25 mg (has no administration in time range)  tamsulosin (FLOMAX) capsule 0.4 mg (has no administration in time range)  carbidopa-levodopa (SINEMET IR) 25-100 MG per tablet immediate release 1 tablet (has no administration in time range)  naphazoline-glycerin (CLEAR EYES REDNESS) ophth solution 1-2 drop (has no administration in time range)  loperamide  (IMODIUM) capsule 2 mg (has no administration in time range)  enoxaparin (LOVENOX) injection 40 mg (has no administration in time range)  sodium chloride flush (NS) 0.9 % injection 3 mL (has no administration in time range)  acetaminophen (TYLENOL) tablet 650 mg (has no administration in time range)  Or  acetaminophen (TYLENOL) suppository 650 mg (has no administration in time range)  ondansetron (ZOFRAN) tablet 4 mg (has no administration in time range)    Or  ondansetron (ZOFRAN) injection 4 mg (has no administration in time range)  lactated ringers 1,000 mL with potassium chloride 20 mEq infusion (has no administration in time range)  sodium chloride 0.9 % bolus 500 mL (0 mLs Intravenous Stopped 01/28/19 1510)  lactated ringers bolus 500 mL (0 mLs Intravenous Stopped 01/28/19 1746)  ipratropium-albuterol (DUONEB) 0.5-2.5 (3) MG/3ML nebulizer solution 3 mL (3 mLs Nebulization Given 01/28/19 1548)  iohexol (OMNIPAQUE) 300 MG/ML solution 100 mL (100 mLs Intravenous Contrast Given 01/28/19 1654)     Initial Impression / Assessment and Plan / ED Course  I have reviewed the triage vital signs and the nursing notes.  Pertinent labs & imaging results that were available during my care of the patient were reviewed by me and considered in my medical decision making (see chart for details).     85 yo M here w/ SOB, which I suspect is actually 2/2 likely SBO vs ileus causing limitation of diaphragm excursion. His abd is markedly distended on exam and he is having profuse watery diarrhea, which could be overflow diarrhea 2/2 partial obstruction, versus acute worsening of his enteritis/diverticulitis. Labs are overall reassuring initially. Will check CT, plan to admit. BIPAp given initially for WOB, but will try to wean. IVF given for likely hypovolemia, though will be cautious given h/o CHF and mild elevation of BNP.  Patient care transferred to Dr. Dayna Barker at the end of my shift. Patient presentation,  ED course, and plan of care discussed with review of all pertinent labs and imaging. Please see his/her note for further details regarding further ED course and disposition.   Final Clinical Impressions(s) / ED Diagnoses   Final diagnoses:  Partial small bowel obstruction ([place])  Respiratory distress  Acute respiratory failure with hypoxia [facility])    ED Discharge Orders    None       Duffy Bruce, MD 01/28/19 [phone]

## 2019-01-28 NOTE — Progress Notes (Signed)
Pt transported with RN from Trauma A to CT2. Pt remained on Bipap during transport. Pt vital signs stable throughout and no complications. Pt asking to take mask off and for drink of water.

## 2019-01-28 NOTE — ED Notes (Signed)
MD Ellender Hose made aware of now lower BP 76/55.

## 2019-01-28 NOTE — ED Notes (Signed)
Family at bedside. Advised on enteric precautions

## 2019-01-28 NOTE — ED Notes (Signed)
The pt remains off by -pap doing well at present

## 2019-01-28 NOTE — ED Notes (Signed)
Report given to the rn on 4e

## 2019-01-28 NOTE — ED Notes (Signed)
Pt having constant liquid stool- MD ordered Flexi seal- was placed by Geni Bers and this RN.

## 2019-01-28 NOTE — ED Triage Notes (Signed)
Pt arrives with [facility] on CPAP for resp distress. Pt has audible rhonchi when switched over to BIPAP on arrival. Pt was found to be 85% on room air.

## 2019-01-28 NOTE — H&P (Signed)
Date: 01/28/2019               Patient Name:  Terry Taylor MRN: [id number]  DOB: [birth year] Age / Sex: 85 y.o., male   PCP: Alvester Chou, NP         Medical Service: Internal Medicine Teaching Service         Attending Physician: Dr. Rebeca Alert Raynaldo Opitz, MD    First Contact: Dr. Laural Golden Pager: [phone]  Second Contact: Dr. Tarri Abernethy  Pager: [phone]       After Hours (After 5p/  First Contact Pager: [phone]  weekends / holidays): Second Contact Pager: [phone]   Chief Complaint: diarrhea   History of Present Illness: Terry Taylor is an 85 y/o gentleman with h/o CAD, COPD, and Parkinson's with dementia who presents with watery diarrhea and abdominal distention beginning last night. History obtained from patient's family at bedside.  Patient was admitted on 1/24 for similar presentation. CT at that time consistent with diarrheal state. He received IVF for dehydration, and diarrhea had improved. Family reports he was doing better since returning home until last night when he began having profuse watery diarrhea described a mustard yellow in color. He has been having BMs approximately 2 hours. It has been profuse and leaking out of his diapers. He had been eating well since discharge. No BMs since returning home from the hospital until last night. He has been much weaker today than his baseline. He has not complained of significant pain, but family believes abdomen is much more distended than last time. No fevers, chills, n/v, melena, hematochezia. He has not eaten anything unusual for him. No sick contacts or recent travel. No history of DM or abdominal surgeries.   On arrival to the ED, patient was in respiratory distress initially requiring BiPAP and hypotensive. On our evaluation he was breathing comfortably on 3L [place] and hemodynamically stable.   Meds:  Current Meds  Medication Sig  . acetaminophen (TYLENOL) 500 MG tablet Take 500 mg by mouth at bedtime.  . carbidopa-levodopa  (SINEMET IR) 25-100 MG tablet Take 1 tablet by mouth 2 (two) times daily.  Marland Kitchen CRANBERRY PO Take 1 tablet by mouth daily.  . isosorbide mononitrate (IMDUR) 30 MG 24 hr tablet Take 0.5 tablets (15 mg total) by mouth daily.  Marland Kitchen loperamide (IMODIUM A-D) 2 MG tablet Take 2 mg by mouth 4 (four) times daily as needed for diarrhea or loose stools.  . Melatonin 3 MG TABS Take 3-6 mg by mouth at bedtime.  . naphazoline-glycerin (CLEAR EYES) 0.012-0.2 % SOLN Place 1-2 drops into both eyes 4 (four) times daily as needed for eye irritation. (Patient taking differently: Place 1-2 drops into both eyes See admin instructions. Instill 1-2 drops into both eyes at bedtime for redness, and as needed daily for irritation)  . NON FORMULARY Take 1 capsule by mouth See admin instructions. OnGuard Immune Defense softgel capsules: Take 1 capsule by mouth once a day  . NON FORMULARY Take 1 packet by mouth See admin instructions. Melaleuca AM and PM Longevity vitamin packets: Take 1 packet by mouth in the morning and 1 packet in the evening  . QUEtiapine (SEROQUEL) 25 MG tablet Take 25 mg by mouth See admin instructions. Take 25 mg by mouth at bedtime and an additional 25 mg in the morning if hallucinating  . tamsulosin (FLOMAX) 0.4 MG CAPS capsule Take 0.4 mg by mouth daily.     Allergies: Allergies as of 01/28/2019 - Review Complete  01/28/2019  Allergen Reaction Noted  . Bee venom Anaphylaxis 01/09/2017  . Adhesive [tape] Other (See Comments) 01/23/2019   Past Medical History:  Diagnosis Date  . Anxiety   . Arthritis    "bad in his knees" (01/09/2017)  . Benign prostatic hypertrophy    hx  . Chronic airway obstruction, not elsewhere classified   . Chronic bronchitis ([place])   . Coronary atherosclerosis of unspecified type of vessel, native or graft   . GERD (gastroesophageal reflux disease)   . New onset atrial fibrillation ([place]) 01/08/2017   Terry Taylor 01/08/2017  . On home oxygen therapy    "prn" (01/09/2017)  .  Osteoarthrosis, unspecified whether generalized or localized, unspecified site   . Parkinson's disease ([place])    with dementia and psychosis.   . Pneumonia    "more than once" (01/09/2017)  . Pure hypercholesterolemia    IIA  . Skin cancer of face   . Stroke [facility])    "they think he had a minor stroke 1-2 yr ago" (01/09/2017)  . Unspecified essential hypertension     Family History:  Family History  Problem Relation Age of Onset  . Stroke Mother   . Stroke Father   . Diabetes Brother   . Other Daughter        adopted     Social History: Patient lives at home with 24 hour care; 40 pack year smoking history. Quit over 40 years ago.   Review of Systems: A complete ROS was negative except as per HPI.   Physical Exam: Blood pressure 118/73, pulse 71, resp. rate 19, SpO2 99 %. General: awake, pleasantly demented gentleman, lying in bed in NAD HEENT: [place]/AT, right bottom eyelid erythematous and swollen Neck: supple; no thyromegaly  CV: RRR; no murmurs, rubs or gallops Pulm: no increased work of breathing. No wheezes or rales.  Abd: BS+; abdomen is significantly distended and tympanitic throughout; no tenderness to palpation  Ext: no pitting edema  Skin: diffusely dry   EKG: personally reviewed my interpretation is NSR; no ischemic changes  CXR: personally reviewed my interpretation is decreased lung volumes. No infiltrate or effusion   Assessment & Plan by Problem: Active Problems:   Abdominal distention  1. Abdominal distension with diarrhea Infectious work-up including CBC, GI panel, and C. Diff are unremarkable. CT reveals distended stomach and proximal and mid small bowel with decompressed distal and terminal ileum without transition zone; colon is fluid distended. Clinical picture may be consistent with overflow diarrhea 2/2 partial SBO. Will send ova and parasite stool studies. Malabsorption less likely but could consider additional work-up if clear etiology is not found.  Patient is not vomiting, but he would benefit from NG tube for decompression which we have ordered once he arrives to the floor. Will order small bowel protocol, keep him on bowel rest and continue IVF at 75 ml/ hr. Treat symptomatically with imodium.    2. Mild AKI - pre-renal in the setting of severe diarrhea - giving fluids as above and will trend BMPs   3. Parkinson's with dementia - will continue home Quetiapine 25 mg daily and carbidopa-levodopa bid - delirium precautions  4. BPH - continue home Flomax   Diet: NPO DVT ppx: Lovenox CODE: FULL   Dispo: Admit patient to Inpatient with expected length of stay greater than 2 midnights.  SignedDelice Bison, DO 01/28/2019, 7:11 PM  Pager: [phone]

## 2019-01-28 NOTE — ED Notes (Signed)
Pt went to c-t and returned back to the room  Bi-pap removed for awhile  Pt asking for water but we need to see if he can tolerate being off the mas

## 2019-01-28 NOTE — ED Provider Notes (Signed)
3:50 PM Assumed care from Dr. Ellender Hose, please see their note for full history, physical and decision making until this point. In brief this is a 85 y.o. year old male who presented to the ED tonight with Respiratory Distress     Concern for SBO/abdominal distension causing dyspnea and hypotension. Pending CT scan and likely admission.   Discharge instructions, including strict return precautions for new or worsening symptoms, given. Patient and/or family verbalized understanding and agreement with the plan as described.   Labs, studies and imaging reviewed by myself and considered in medical decision making if ordered. Imaging interpreted by radiology.  Labs Reviewed  CBC WITH DIFFERENTIAL/PLATELET - Abnormal; Notable for the following components:      Result Value   RDW 15.9 (*)    All other components within normal limits  COMPREHENSIVE METABOLIC PANEL - Abnormal; Notable for the following components:   Potassium 3.3 (*)    CO2 20 (*)    Glucose, Bld 151 (*)    Creatinine, Ser 1.30 (*)    Calcium 8.5 (*)    Albumin 3.2 (*)    AST 13 (*)    GFR calc non Af Amer 49 (*)    GFR calc Af Amer 56 (*)    All other components within normal limits  BRAIN NATRIURETIC PEPTIDE - Abnormal; Notable for the following components:   B Natriuretic Peptide 183.7 (*)    All other components within normal limits  AMMONIA - Abnormal; Notable for the following components:   Ammonia 38 (*)    All other components within normal limits  POCT I-STAT EG7 - Abnormal; Notable for the following components:   pCO2, Ven 39.5 (*)    Acid-base deficit 4.0 (*)    Potassium 3.3 (*)    All other components within normal limits  C DIFFICILE QUICK SCREEN W PCR REFLEX  GASTROINTESTINAL PANEL BY PCR, STOOL (REPLACES STOOL CULTURE)  TROPONIN I    DG Chest Portable 1 View  Final Result    DG Abd Portable 1 View  Final Result    CT ABDOMEN PELVIS W CONTRAST    (Results Pending)    No follow-ups on file.     Abriella Filkins, Corene Cornea, MD 01/31/19 [phone]

## 2019-01-29 DIAGNOSIS — Z91048 Other nonmedicinal substance allergy status: Secondary | ICD-10-CM

## 2019-01-29 DIAGNOSIS — Z933 Colostomy status: Secondary | ICD-10-CM

## 2019-01-29 DIAGNOSIS — N179 Acute kidney failure, unspecified: Secondary | ICD-10-CM

## 2019-01-29 DIAGNOSIS — Z79899 Other long term (current) drug therapy: Secondary | ICD-10-CM

## 2019-01-29 DIAGNOSIS — F0280 Dementia in other diseases classified elsewhere without behavioral disturbance: Secondary | ICD-10-CM

## 2019-01-29 DIAGNOSIS — Z91030 Bee allergy status: Secondary | ICD-10-CM

## 2019-01-29 DIAGNOSIS — K529 Noninfective gastroenteritis and colitis, unspecified: Secondary | ICD-10-CM | POA: Diagnosis present

## 2019-01-29 DIAGNOSIS — N400 Enlarged prostate without lower urinary tract symptoms: Secondary | ICD-10-CM

## 2019-01-29 DIAGNOSIS — I2510 Atherosclerotic heart disease of native coronary artery without angina pectoris: Secondary | ICD-10-CM

## 2019-01-29 DIAGNOSIS — I959 Hypotension, unspecified: Secondary | ICD-10-CM

## 2019-01-29 DIAGNOSIS — Z87891 Personal history of nicotine dependence: Secondary | ICD-10-CM

## 2019-01-29 DIAGNOSIS — J449 Chronic obstructive pulmonary disease, unspecified: Secondary | ICD-10-CM

## 2019-01-29 LAB — BASIC METABOLIC PANEL
Anion gap: 8 (ref 5–15)
BUN: 18 mg/dL (ref 8–23)
CO2: 20 mmol/L — AB (ref 22–32)
Calcium: 7.4 mg/dL — ABNORMAL LOW (ref 8.9–10.3)
Chloride: 112 mmol/L — ABNORMAL HIGH (ref 98–111)
Creatinine, Ser: 1.19 mg/dL (ref 0.61–1.24)
GFR calc Af Amer: >60 mL/min (ref >60)
GFR calc non Af Amer: 54 mL/min — ABNORMAL LOW (ref >60)
Glucose, Bld: 112 mg/dL — ABNORMAL HIGH (ref 70–99)
Potassium: 3.4 mmol/L — ABNORMAL LOW (ref 3.5–5.1)
Sodium: 140 mmol/L (ref 135–145)

## 2019-01-29 NOTE — Progress Notes (Signed)
Pt was ordered to have an NG tube placed when he arrived on the floor.  The charge nurse attempted to place the tube through the left nares twice, but was unable to get it through. Tried several angles to pass the tube with no success.  Next, he attempted to pass the tube through the right nares two times but could not get it through. Blood was seen on the tube and in the nares after each attempt.  Informed the on call physician and said they will make a decision in the AM. Radiology was also informed.  Will continue to monitor.  Lupita Dawn, RN

## 2019-01-29 NOTE — Progress Notes (Signed)
  Date: 01/29/2019  Patient name: Terry Taylor  Medical record number: [id number]  Date of birth: [birth year]   I have seen and evaluated this patient and I have discussed the plan of care with the house staff. Please see their note for complete details. I concur with their findings with the following additions/corrections:   Please see my separate attestation from the H&P from 01/28/2019.  He has improved in volume status since admission with fluid resuscitation, but continues to require IV fluids for his ongoing diarrhea.  His respiratory distress has resolved and we weaned him off of oxygen during rounds this morning.  Still awaiting ova and parasite screen.  We will continue IV fluid support until he can maintain his own hydration.  Lenice Pressman, M.D., Ph.D. 01/29/2019, 3:30 PM

## 2019-01-29 NOTE — Evaluation (Signed)
Occupational Therapy Evaluation Patient Details Name: Terry Taylor MRN: [id number] DOB: 1928/03/08 Today's Date: 01/29/2019    History of Present Illness Patient is a 85 y/o male presenting on 1/29 with abdominal pain and diarrhea. Recent admission and discharge 1/25. PMH signficant for CAD, COPD, and Parkinson's with dementia. Admitted for dehydration secondary to diarrhea.    Clinical Impression   Pt is an 85 yo male s/p above dx. Pt PTA: living with family with 24/7 care, but recent decline requiring x2 people to handle pt. Pt currently, performing bed mobility with totalA +2 and MaxA+2 for sit to stand. Pt with posterior lean in standing so no steps were taken. Pt performing ADLs with increased assist due to weakness and a/o x1 to self due to dementia at baseline. Pt would benefit from continued OT skilled services for ADL, mobility and safety in SNF setting.    Follow Up Recommendations  SNF;Supervision/Assistance - 24 hour    Equipment Recommendations  None recommended by OT    Recommendations for Other Services       Precautions / Restrictions Precautions Precautions: Fall Precaution Comments: flexiseal Restrictions Weight Bearing Restrictions: No      Mobility Bed Mobility Overal bed mobility: Needs Assistance Bed Mobility: Rolling;Sidelying to Sit;Sit to Sidelying Rolling: Total assist Sidelying to sit: Total assist;+2 for physical assistance     Sit to sidelying: Total assist;+2 for physical assistance General bed mobility comments: Pt with no attempt to assist with all aspects of bed mobility, requiring totalA + 2. Very stiff and difficult to move  Transfers Overall transfer level: Needs assistance Equipment used: Rolling walker (2 wheeled);2 person hand held assist Transfers: Sit to/from Stand Sit to Stand: Max assist;+2 physical assistance         General transfer comment: MaxA + 2 to stand with pt preferring both hands on Rollator. Anterior foot  slide due to heavy posterior lean    Balance Overall balance assessment: Needs assistance Sitting-balance support: Bilateral upper extremity supported;Feet supported Sitting balance-Leahy Scale: Poor Sitting balance - Comments: posterior and right lateral lean, requiring up to max assist with fatigue     Standing balance-Leahy Scale: Zero                             ADL either performed or assessed with clinical judgement   ADL Overall ADL's : Needs assistance/impaired Eating/Feeding: Minimal assistance   Grooming: Maximal assistance;Cueing for safety;Sitting   Upper Body Bathing: Maximal assistance;Sitting   Lower Body Bathing: Total assistance   Upper Body Dressing : Maximal assistance   Lower Body Dressing: Total assistance   Toilet Transfer: Maximal assistance;+2 for physical assistance;+2 for safety/equipment;BSC   Toileting- Clothing Manipulation and Hygiene: Maximal assistance;+2 for physical assistance;+2 for safety/equipment       Functional mobility during ADLs: Maximal assistance;+2 for safety/equipment;Rolling walker(posterior lean present for sit to stand, unable to take step) General ADL Comments: maxA to Early due to dementia at baseline     Vision Baseline Vision/History: Wears glasses Vision Assessment?: No apparent visual deficits     Perception     Praxis      Pertinent Vitals/Pain Pain Assessment: No/denies pain     Hand Dominance     Extremity/Trunk Assessment Upper Extremity Assessment Upper Extremity Assessment: Generalized weakness   Lower Extremity Assessment Lower Extremity Assessment: Generalized weakness   Cervical / Trunk Assessment Cervical / Trunk Assessment: Kyphotic   Communication Communication Communication: [facility]  Cognition Arousal/Alertness: Awake/alert Behavior During Therapy: Flat affect Overall Cognitive Status: History of cognitive impairments - at baseline(dementia)                                  General Comments: pt oriented to self only   General Comments       Exercises     Shoulder Instructions      Home Living Family/patient expects to be discharged to:: Private residence Living Arrangements: Children;Non-relatives/Friends Available Help at Discharge: Family;Personal care attendant;Available 24 hours/day Type of Home: House       Home Layout: One level     Bathroom Shower/Tub: Occupational psychologist: Handicapped height     Home Equipment: Environmental consultant - 2 wheels;Hospital bed;Bedside commode;Wheelchair - manual          Prior Functioning/Environment Level of Independence: Needs assistance  Gait / Transfers Assistance Needed: pt requires assist with transfers and limited ambulation using walker ADL's / Homemaking Assistance Needed: caregiver provided assist for ADLs and meals            OT Problem List: Decreased strength;Decreased activity tolerance;Decreased cognition;Increased edema      OT Treatment/Interventions: Self-care/ADL training;Therapeutic exercise;Energy conservation;DME and/or AE instruction;Therapeutic activities;Patient/family education;Balance training    OT Goals(Current goals can be found in the care plan section) Acute Rehab OT Goals Patient Stated Goal: none stated OT Goal Formulation: With patient/family Time For Goal Achievement: 02/12/19 Potential to Achieve Goals: Fair  OT Frequency: Min 2X/week   Barriers to D/C: Decreased caregiver support(has 1 CG hired, but requires 2 with declines)          Co-evaluation   Reason for Co-Treatment: Complexity of the patient's impairments (multi-system involvement);Necessary to address cognition/behavior during functional activity;For patient/therapist safety;To address functional/ADL transfers PT goals addressed during session: Mobility/safety with mobility        AM-PAC OT "6 Clicks" Daily Activity     Outcome Measure Help from another person eating  meals?: A Lot Help from another person taking care of personal grooming?: A Lot Help from another person toileting, which includes using toliet, bedpan, or urinal?: Total Help from another person bathing (including washing, rinsing, drying)?: Total Help from another person to put on and taking off regular upper body clothing?: Total Help from another person to put on and taking off regular lower body clothing?: Total 6 Click Score: 8   End of Session Equipment Utilized During Treatment: Gait belt;Rolling walker Nurse Communication: Mobility status  Activity Tolerance: Treatment limited secondary to medical complications (Comment) Patient left: in bed;with call bell/phone within reach;with bed alarm set;with family/visitor present  OT Visit Diagnosis: Unsteadiness on feet (R26.81);Muscle weakness (generalized) (M62.81)                Time: 2979-8921 OT Time Calculation (min): 30 min Charges:  OT General Charges $OT Visit: 1 Visit OT Evaluation $OT Eval Moderate Complexity: 1 Mod  Darryl Nestle) Marsa Aris OTR/L Acute Rehabilitation Services Pager: [phone] Office: [phone]   Fredda Hammed 01/29/2019, 4:41 PM

## 2019-01-29 NOTE — Consult Note (Signed)
   [facility] CM Inpatient Consult   01/29/2019  Terry Taylor 25-Apr-1928 [id number]   Patient screened for readmission less than 30 days and within 7 days for hospitalizations.  Showing as being followed by [facility] with Odessa Fleming, F-NP as his Primary Care Provider. Spoke with inpatient RNCM regarding patient will not be eligible for [facility] Care Management if primary care provider is not an affiliate of [facility] [employer].  For questions contact:   Natividad Brood, RN BSN [facility] Liaison  [phone] business mobile phone Toll free office [phone]

## 2019-01-29 NOTE — Progress Notes (Signed)
   Subjective: No overnight events. Terry Taylor reports that he has abdominal pain, mostly on the right side. He is unable to tell the team if he has had more diarrhea. His flexi seal has a small amount of stool, but was likely changed this morning at shift change. He states that he "needs to get out of here." He is also hungry and thirsty. Nurse reports that he has not had any episodes of vomiting.   Objective:  Vital signs in last 24 hours: Vitals:   01/28/19 2000 01/28/19 2100 01/28/19 2325 01/29/19 0447  BP: 136/67 124/71 115/74 124/73  Pulse: 69 72 67 68  Resp: 16 16 18 17   Temp:  98.2 F (36.8 C) 98.5 F (36.9 C) 98.1 F (36.7 C)  TempSrc:  Oral Oral Oral  SpO2: 100% 96% 98% 97%  Weight:  83.6 kg  83.6 kg   Gen: lying comfortably in bed, no distress Pulm: 95% with oxygen cannula askew on his face, normal effort CV: RRR, no murmurs Abd: soft, non-distended, no ttp, bowel sounds present Skin: + skin turgor  Assessment/Plan:  Active Problems:   Abdominal distention  Abdominal distension with diarrhea - Infectious work-up including CBC, GI panel, and C. Diff are unremarkable.  - CT reveals distended stomach and proximal and mid small bowel with decompressed distal and terminal ileum without transition zone; colon is fluid distended.  - Unclear etiology, possible overflow diarrhea 2/2 partial SBO vs adynamic ileus  - Follow up ova and parasite stool studies  - NGT placement was unsuccessful after numerous attempts, per report felt obstructive  - Bowel rest - IVF 75 ml/hr - Continue imodium  Mild AKI - resolved - pre-renal in the setting of severe diarrhea - giving fluids as above and will trend BMPs   Parkinson's with dementia - will continue home Quetiapine 25 mg daily and carbidopa-levodopa bid - delirium precautions   Dispo: Anticipated discharge pending clinical improvement.  Mike Craze, DO 01/29/2019, 6:50 AM Pager: [phone]

## 2019-01-29 NOTE — Care Management Note (Signed)
Case Management Note Marvetta Gibbons RN, BSN Transitions of Care Unit 4E- RN Case Manager [phone]  Patient Details  Name: Terry Taylor MRN: [id number] Date of Birth: 11/06/1928  Subjective/Objective:    Pt re-admitted (d/c on 1/25) with abdominal distension ?ileus - plan for bowel rest, mild AKI (pt with hx of parkinson's and dementia)           Action/Plan: PTA pt was at home with son, on last admit SNF recommended however son declined and wanted to take pt home with [facility]- active with [facility] for HHRN/PT services. Also has private duty caregivers. CM will follow for transition of care needs- PT/OT evals pending.   Expected Discharge Date:                  Expected Discharge Plan:  Skilled Nursing Facility  In-House Referral:  Clinical Social Work  Discharge planning Services  CM Consult  Post Acute Care Choice:  Home Health, Resumption of Svcs/PTA Provider Choice offered to:  Patient, Adult Children  DME Arranged:    DME Agency:     HH Arranged:    [place] Agency:  [place]  Status of Service:  In process, will continue to follow  If discussed at Long Length of Stay Meetings, dates discussed:    Discharge Disposition:   Additional Comments:  Dawayne Patricia, RN 01/29/2019, 11:45 AM

## 2019-01-29 NOTE — Progress Notes (Signed)
[facility]  Patient Status: Active (receiving services up to time of hospitalization)  [facility] is providing the following services: RN and PT  If patient discharges after hours, please call [phone].   Terry Taylor 01/29/2019, 11:46 AM

## 2019-01-29 NOTE — Evaluation (Signed)
Physical Therapy Evaluation Patient Details Name: Terry Taylor MRN: [id number] DOB: 1928-07-12 Today's Date: 01/29/2019   History of Present Illness  Patient is a 85 y/o male presenting on 1/29 with abdominal pain and diarrhea. Recent admission and discharge 1/25. PMH signficant for CAD, COPD, and Parkinson's with dementia. Admitted for dehydration secondary to diarrhea.   Clinical Impression  Pt admitted with above. At baseline, pt requires assist for ADL's and limited household ambulation using rollator, however, pt family reports he has become increasingly debilitated recently and requires use of two person caregiver assist and wheelchair. On PT evaluation, pt presenting with debility/deconditioning, generalized weakness, and poor balance. Requiring up to two person total assistance for bed mobility and two person maximal assistance to stand. Unable to walk. Recommending SNF at discharge. Will follow acutely.    Follow Up Recommendations SNF;Supervision/Assistance - 24 hour    Equipment Recommendations  None recommended by PT    Recommendations for Other Services   Palliative consult    Precautions / Restrictions Precautions Precautions: Fall Precaution Comments: flexiseal Restrictions Weight Bearing Restrictions: No      Mobility  Bed Mobility Overal bed mobility: Needs Assistance Bed Mobility: Rolling;Sidelying to Sit;Sit to Sidelying Rolling: Total assist Sidelying to sit: Total assist;+2 for physical assistance     Sit to sidelying: Total assist;+2 for physical assistance General bed mobility comments: Pt with no attempt to assist with all aspects of bed mobility, requiring totalA + 2. Very stiff and difficult to move  Transfers Overall transfer level: Needs assistance Equipment used: Rolling walker (4 wheeled) Transfers: Sit to/from Stand Sit to Stand: Max assist;+2 physical assistance         General transfer comment: MaxA + 2 to stand with pt preferring  both hands on Rollator. Anterior foot slide due to heavy posterior lean  Ambulation/Gait                Stairs            Wheelchair Mobility    Modified Rankin (Stroke Patients Only)       Balance Overall balance assessment: Needs assistance Sitting-balance support: Bilateral upper extremity supported;Feet supported Sitting balance-Leahy Scale: Poor Sitting balance - Comments: posterior and right lateral lean, requiring up to max assist with fatigue     Standing balance-Leahy Scale: Zero                               Pertinent Vitals/Pain Pain Assessment: No/denies pain    Home Living Family/patient expects to be discharged to:: Private residence Living Arrangements: Children;Non-relatives/Friends Available Help at Discharge: Family;Personal care attendant;Available 24 hours/day Type of Home: House       Home Layout: One level Home Equipment: Dougherty - 2 wheels;Hospital bed;Bedside commode;Wheelchair - manual      Prior Function Level of Independence: Needs assistance   Gait / Transfers Assistance Needed: pt requires assist with transfers and limited ambulation using walker  ADL's / Homemaking Assistance Needed: caregiver provided assist for ADLs and meals        Hand Dominance        Extremity/Trunk Assessment   Upper Extremity Assessment Upper Extremity Assessment: Defer to OT evaluation    Lower Extremity Assessment Lower Extremity Assessment: Generalized weakness    Cervical / Trunk Assessment Cervical / Trunk Assessment: Kyphotic  Communication   Communication: [facility]  Cognition Arousal/Alertness: Awake/alert Behavior During Therapy: Flat affect Overall Cognitive Status: History of cognitive impairments -  at baseline                                 General Comments: pt oriented to self only      General Comments      Exercises     Assessment/Plan    PT Assessment Patient needs continued PT  services  PT Problem List Decreased strength;Decreased activity tolerance;Decreased balance;Decreased mobility;Decreased knowledge of use of DME;Decreased safety awareness       PT Treatment Interventions DME instruction;Gait training;Functional mobility training;Therapeutic activities;Therapeutic exercise;Balance training;Patient/family education    PT Goals (Current goals can be found in the Care Plan section)  Acute Rehab PT Goals Patient Stated Goal: none stated PT Goal Formulation: Patient unable to participate in goal setting Time For Goal Achievement: 02/12/19 Potential to Achieve Goals: Fair    Frequency Min 2X/week   Barriers to discharge        Co-evaluation PT/OT/SLP Co-Evaluation/Treatment: Yes Reason for Co-Treatment: Complexity of the patient's impairments (multi-system involvement);Necessary to address cognition/behavior during functional activity;For patient/therapist safety;To address functional/ADL transfers PT goals addressed during session: Mobility/safety with mobility         AM-PAC PT "6 Clicks" Mobility  Outcome Measure Help needed turning from your back to your side while in a flat bed without using bedrails?: Total Help needed moving from lying on your back to sitting on the side of a flat bed without using bedrails?: Total Help needed moving to and from a bed to a chair (including a wheelchair)?: Total Help needed standing up from a chair using your arms (e.g., wheelchair or bedside chair)?: A Lot Help needed to walk in hospital room?: Total Help needed climbing 3-5 steps with a railing? : Total 6 Click Score: 7    End of Session Equipment Utilized During Treatment: Gait belt Activity Tolerance: Patient tolerated treatment well Patient left: in bed;with call bell/phone within reach;with bed alarm set;with family/visitor present Nurse Communication: Mobility status PT Visit Diagnosis: Unsteadiness on feet (R26.81);Other abnormalities of gait and  mobility (R26.89);Muscle weakness (generalized) (M62.81)    Time: 1204-1229 PT Time Calculation (min) (ACUTE ONLY): 25 min   Charges:   PT Evaluation $PT Eval Moderate Complexity: 1 Mod          Ellamae Sia, [place], DPT Acute Rehabilitation Services Pager [phone] Office [phone]   Willy Eddy 01/29/2019, 2:21 PM

## 2019-01-30 ENCOUNTER — Other Ambulatory Visit: Payer: Self-pay

## 2019-01-30 ENCOUNTER — Encounter (HOSPITAL_COMMUNITY): Payer: Self-pay

## 2019-01-30 LAB — BASIC METABOLIC PANEL
Anion gap: 8 (ref 5–15)
BUN: 15 mg/dL (ref 8–23)
CO2: 22 mmol/L (ref 22–32)
Calcium: 7.5 mg/dL — ABNORMAL LOW (ref 8.9–10.3)
Chloride: 114 mmol/L — ABNORMAL HIGH (ref 98–111)
Creatinine, Ser: 1.07 mg/dL (ref 0.61–1.24)
GFR calc Af Amer: >60 mL/min (ref >60)
GFR calc non Af Amer: >60 mL/min (ref >60)
Glucose, Bld: 88 mg/dL (ref 70–99)
Potassium: 3.6 mmol/L (ref 3.5–5.1)
Sodium: 144 mmol/L (ref 135–145)

## 2019-01-30 NOTE — Discharge Summary (Signed)
Name: Terry Taylor MRN: [id number] DOB: Oct 28, 1928 85 y.o. PCP: Alvester Chou, NP  Date of Admission: 01/28/2019 12:48 PM Date of Discharge: 02/10/2019 Attending Physician: Gilles Chiquito B  Discharge Diagnosis: 1. Dehydration 2. Gastroenteritis  3. AKI  Discharge Medications: Allergies as of 02/10/2019      Reactions   Bee Venom Anaphylaxis   Adhesive [tape] Other (See Comments)   TEARS THE SKIN      Medication List    STOP taking these medications   CRANBERRY PO   feeding supplement (ENSURE ENLIVE) Liqd   tamsulosin 0.4 MG Caps capsule Commonly known as:  FLOMAX     TAKE these medications   acetaminophen 500 MG tablet Commonly known as:  TYLENOL Take 500 mg by mouth at bedtime.   carbidopa-levodopa 25-100 MG tablet Commonly known as:  SINEMET IR Take 1 tablet by mouth 2 (two) times daily.   isosorbide mononitrate 30 MG 24 hr tablet Commonly known as:  IMDUR Take 0.5 tablets (15 mg total) by mouth daily.   loperamide 2 MG tablet Commonly known as:  IMODIUM A-D Take 2 mg by mouth 4 (four) times daily as needed for diarrhea or loose stools.   Melatonin 3 MG Tabs Take 3-6 mg by mouth at bedtime.   morphine CONCENTRATE 10 MG/0.5ML Soln concentrated solution Take 0.25 mLs (5 mg total) by mouth every 4 (four) hours as needed for moderate pain or shortness of breath.   naphazoline-glycerin 0.012-0.2 % Soln Commonly known as:  CLEAR EYES REDNESS Place 1-2 drops into both eyes 4 (four) times daily as needed for eye irritation. What changed:    when to take this  additional instructions   NON FORMULARY Take 1 capsule by mouth See admin instructions. OnGuard Immune Defense softgel capsules: Take 1 capsule by mouth once a day   NON FORMULARY Take 1 packet by mouth See admin instructions. Melaleuca AM and PM Longevity vitamin packets: Take 1 packet by mouth in the morning and 1 packet in the evening   QUEtiapine 25 MG tablet Commonly known as:   SEROQUEL Take 25 mg by mouth See admin instructions. Take 25 mg by mouth at bedtime and an additional 25 mg in the morning if hallucinating   scopolamine 1 MG/3DAYS Commonly known as:  TRANSDERM-SCOP Place 1 patch (1.5 mg total) onto the skin every 3 (three) days. Start taking on:  February 12, 2019       Disposition and follow-up:   [name] was discharged from [facility] in Serious condition.  At the hospital follow up visit please address:  1.  Patient was discharged home with comfort care.   2.  Labs / imaging needed at time of follow-up: None  3.  Pending labs/ test needing follow-up: None  Follow-up Appointments: Follow-up Information    Alvester Chou, NP .   Specialty:  Nurse Practitioner Contact information: [facility] [place] [address] [phone]           Hospital Course by problem list: 1. Dehydration 2. Gastroenteritis  3. AKI  [name] presented with a one day history of watery diarrhea and abdominal distension. He was recently admitted on 1/24 for a similar presentation. CT at that time was consistent with diarrheal state. He was hydrated and symptoms improved. He was doing better until the day before admission he started having diarrhea again every two hours. On admission he was in respiratory distress requiring BiPAP  and had a mild AKI which both resolved. CT reveals distended stomach and proximal and mid small bowel with decompressed distal and terminal ileum without transition zone; colon is fluid distended. Infectious workup was unremarkable. Likely gastroenteritis with overflow diarrhea 2/2 partial SBO vs adynamic ileus. He was treated with IVF and bowel rest.  Degenerative place an NG tube however after multiple failed attempts in since patient was not having any nausea or vomiting this was initially not done. Patient had a very poor improvement over the next few days and his  abdominal exam continued to worsen.  Repeat chest x-ray showed continued ileus.  Gastroenterology was consulted and they had IR place an NG tube.  We continued to replete his electrolytes and mobilize him as much as possible.  He had very poor improvement over the next few days with no bowel movements or passing gas.  He had not had any nutrition for about 1 week and we started to consider TPN.  However patient pulled out his NG tube and we did not think that TPN would be a good option due to patients poor prognosis and comorbidities.  Palliative care was consulted and the family had decided to pursue a more comfort care approach.  They wanted [name] to return home and have comfort care at home.  This was arranged with hospice and he was discharged home with some pain medications.   Discharge Vitals:   BP 117/68 (BP Location: Right Arm)   Pulse (!) 53   Temp 98.5 F (36.9 C) (Oral)   Resp 17   Wt 83.2 kg   SpO2 94%   BMI 24.88 kg/m   Pertinent Labs, Studies, and Procedures:  CBC Latest Ref Rng & Units 02/04/2019 02/03/2019 02/01/2019  WBC 4.0 - 10.5 K/uL 9.5 10.7(H) -  Hemoglobin 13.0 - 17.0 g/dL 12.8(L) 13.6 -  Hematocrit 39.0 - 52.0 % 37.4(L) 39.6 39.2  Platelets 150 - 400 K/uL 263 281 -   BMP Latest Ref Rng & Units 02/10/2019 02/09/2019 02/08/2019  Glucose 70 - 99 mg/dL 97 113(H) 105(H)  BUN 8 - 23 mg/dL 8 6(L) 6(L)  Creatinine 0.61 - 1.24 mg/dL 1.24 1.28(H) 1.09  Sodium 135 - 145 mmol/L 138 136 136  Potassium 3.5 - 5.1 mmol/L 5.0 4.1 4.4  Chloride 98 - 111 mmol/L 107 101 104  CO2 22 - 32 mmol/L 24 26 24   Calcium 8.9 - 10.3 mg/dL 8.2(L) 8.2(L) 7.9(L)     Discharge Instructions: Discharge Instructions    Call MD for:  extreme fatigue   Complete by:  As directed    Call MD for:  persistant dizziness or light-headedness   Complete by:  As directed    Call MD for:  persistant nausea and vomiting   Complete by:  As directed    Call MD for:  redness, tenderness, or signs of infection  (pain, swelling, redness, odor or green/yellow discharge around incision site)   Complete by:  As directed    Call MD for:  severe uncontrolled pain   Complete by:  As directed    Diet - low sodium heart healthy   Complete by:  As directed    Discharge instructions   Complete by:  As directed    [name] and family,   You were hospitalized for an ileus, where your bowel functions stopped working. It looks like this was due to your infection that you had a week before. We had placed the NG tube and corrected your  electrolytes however your bowel functions did not return. We have prescribed you with some medications to help keep you comfortable.   If you have any questions please contact the IM clinic at [phone]   Increase activity slowly   Complete by:  As directed       Signed: [name], MD 02/11/2019, 4:27 PM   Pager: @[phone] @

## 2019-01-30 NOTE — Progress Notes (Addendum)
  Date: 01/30/2019  Patient name: Terry Taylor  Medical record number: [id number]  Date of birth: 1928/04/11   I have seen and evaluated this patient and I have discussed the plan of care with the house staff. Please see their note for complete details. I concur with their findings with the following additions/corrections:   85 year old man with history of dementia, Parkinson's, COPD, and CAD who was admitted with diarrhea and abdominal distention.  He was recently admitted a week ago for similar presentation, and improved in the meantime.  Unclear the etiology of his recurrent diarrhea, differential is viral gastroenteritis versus postinfectious ileus.  Put on bowel rest with IV fluid resuscitation on admission, his diarrhea has slowed significantly.  On exam today, his abdomen remains distended and tympanic, but soft and only mildly tender with active bowel sounds.  We will try advancing his diet to clear liquids today and see how he tolerates it.  Lenice Pressman, M.D., Ph.D. 01/30/2019, 3:47 PM

## 2019-01-30 NOTE — Care Management Important Message (Signed)
Important Message  Patient Details  Name: Terry Taylor MRN: [id number] Date of Birth: 03-29-1928   Medicare Important Message Given:  Yes    [name] P [place] 01/30/2019, 3:00 PM

## 2019-01-30 NOTE — Progress Notes (Addendum)
   Subjective: No overnight events. Nursing reports that he has not had any bowel movements or emesis. He was begging for water yesterday and tolerated sips well. The patient himself is quiet this morning as he is just waking up.  Objective:  Vital signs in last 24 hours: Vitals:   01/29/19 1620 01/29/19 1928 01/29/19 2323 01/30/19 0433  BP: (!) 157/85 (!) 150/83 (!) 130/92 127/71  Pulse: 78 80 63 65  Resp: 20 19 16 16   Temp: 98.4 F (36.9 C) 98.5 F (36.9 C) 98 F (36.7 C) 97.8 F (36.6 C)  TempSrc: Oral Oral Temporal Oral  SpO2: 97% 96% 95% 97%  Weight:       Gen: sleeping comfortably in bed, no distress CV: RRR, no murmurs Abd: bowel sounds present, mildly distended, tympanic to percussion, no tenderness  Assessment/Plan:  Principal Problem:   Dehydration Active Problems:   Dementia (HCC)   Parkinson disease (HCC)   Abdominal distention   Gastroenteritis  Abdominal distension with diarrhea - Unclear etiology, possible overflow diarrhea 2/2 partial SBO vs adynamic ileus  - 100 cc of liquid stool appreciated in flexiseal bag - Ova and parasite stool studies pending - Advance to clear liquid diet  - IVF 75 ml/hr - Continue imodium  Parkinson's with dementia - will continue home Quetiapine 25 mg daily and carbidopa-levodopa bid - delirium precautions   Dispo: Anticipated discharge pending patient's ability to tolerate PO intake. Patient's family declined SNF during his last hospital admission and wanted him to continue with advanced home care.   Mike Craze, DO 01/30/2019, 6:50 AM Pager: [phone]

## 2019-01-31 ENCOUNTER — Inpatient Hospital Stay (HOSPITAL_COMMUNITY): Payer: Medicare HMO

## 2019-01-31 LAB — PHOSPHORUS: Phosphorus: 2.3 mg/dL — ABNORMAL LOW (ref 2.5–4.6)

## 2019-01-31 LAB — MAGNESIUM: Magnesium: 1.4 mg/dL — ABNORMAL LOW (ref 1.7–2.4)

## 2019-01-31 MED ORDER — BENZONATATE 100 MG PO CAPS
100.0000 mg | ORAL_CAPSULE | Freq: Two times a day (BID) | ORAL | Status: DC | PRN
  Administered 2019-02-06 – 2019-02-09 (×3): 100 mg via ORAL
  Filled 2019-01-31 (×3): qty 1

## 2019-01-31 MED ORDER — MAGNESIUM SULFATE 4 GM/100ML IV SOLN
4.0000 g | Freq: Once | INTRAVENOUS | Status: AC
  Administered 2019-01-31: 4 g via INTRAVENOUS
  Filled 2019-01-31: qty 100

## 2019-01-31 MED ORDER — POTASSIUM & SODIUM PHOSPHATES 280-160-250 MG PO PACK
2.0000 | PACK | ORAL | Status: DC
  Administered 2019-01-31: 2 via ORAL
  Filled 2019-01-31 (×3): qty 2

## 2019-01-31 MED ORDER — PROMETHAZINE HCL 25 MG PO TABS: 25.0000 mg | ORAL_TABLET | Freq: Four times a day (QID) | ORAL | Status: DC | PRN

## 2019-01-31 MED ORDER — KCL IN DEXTROSE-NACL 20-5-0.45 MEQ/L-%-% IV SOLN
INTRAVENOUS | Status: DC
  Administered 2019-01-31 – 2019-02-10 (×13): via INTRAVENOUS
  Filled 2019-01-31 (×18): qty 1000

## 2019-01-31 MED ORDER — SODIUM CHLORIDE 0.9 % IV SOLN
250.0000 mg | Freq: Three times a day (TID) | INTRAVENOUS | Status: DC
  Administered 2019-01-31 – 2019-02-01 (×3): 250 mg via INTRAVENOUS
  Filled 2019-01-31 (×4): qty 5

## 2019-01-31 NOTE — Progress Notes (Addendum)
   Subjective: Patient was resting comfortably in bed.  Denied any new comlaints. Nurse reports that he was confused overnight. Didn't have any bowel movement, no vomiting. Has been able to take medications with no issues with some water.    Objective:  Vital signs in last 24 hours: Vitals:   01/30/19 1131 01/30/19 1646 01/30/19 2050 01/31/19 0100  BP: 135/79 (!) 156/86 (!) 171/85 (!) 141/79  Pulse:  74 67 67  Resp: 19 (!) 22 16 16   Temp: 98.3 F (36.8 C) 98.9 F (37.2 C) 98.5 F (36.9 C) 98.7 F (37.1 C)  TempSrc: Oral Oral Oral Axillary  SpO2: 95% 94% 95% 95%  Weight:    84.8 kg    General: Awake, alert, NAD Cardiac: RRR, no m/r/g Abdomen: Distended, +BS, tympanic to percussion, no tenderness Extremity: No LE edema   Assessment/Plan:  Principal Problem:   Dehydration Active Problems:   Dementia (HCC)   Parkinson disease (HCC)   Abdominal distention   Gastroenteritis  This is an 85 year old male with a history of COPD, CAD, Parkinsons, and dementia who presented on 1/30 diarrhea and abdominal distension, found to be hypotensive and had respiratory distress that improved with [place].  CBC, GI and c diff were negative. CT scan showed distended stomach and proximal and mid small bowel with decompressed distal and terminal ileum without transition, colon is fluid distended.  An NG tube had tried to be placed however it was unable pass it through.   Dehydration 2/2 diarrhea, unclear etiology: -Has not been having any bowel movements, only drinking a little bit of water, still significantly distended, with normoactive bowel sounds.  He has some tenderness to palpation of his abdomen. Possibly a SBO vs an ileus, will obtain repeat imaging to evaluate.  -Ova and parasite stool studies pending -Continue oral fluid intake -Continue IVF -Continue imodium -Continue zofran PRN -PT/OT recommended SNF on discharge  Parkinson's, dementia: -Continue quetiapine and carbidopa-levodopa  BID  BPH: -continue home flomax  AKI: -Resolved -Monitor BMP  FEN: LR 50cc/hr, replete lytes prn, Clear liquid diet VTE ppx: Lovenox 3 Code Status: FULL    Dispo: Anticipated discharge is pending clinical improvement, needs to be able to tolerate PO. PT/OT recommended SNF however patients family declined this during the last admission, we will re-address it with them.   [name], MD 01/31/2019, 6:25 AM Pager: [phone]

## 2019-02-01 DIAGNOSIS — E876 Hypokalemia: Secondary | ICD-10-CM

## 2019-02-01 LAB — BASIC METABOLIC PANEL
Anion gap: 10 (ref 5–15)
BUN: 10 mg/dL (ref 8–23)
CO2: 23 mmol/L (ref 22–32)
Calcium: 7.5 mg/dL — ABNORMAL LOW (ref 8.9–10.3)
Chloride: 105 mmol/L (ref 98–111)
Creatinine, Ser: 0.98 mg/dL (ref 0.61–1.24)
GFR calc Af Amer: >60 mL/min (ref >60)
GFR calc non Af Amer: >60 mL/min (ref >60)
Glucose, Bld: 126 mg/dL — ABNORMAL HIGH (ref 70–99)
Potassium: 3.6 mmol/L (ref 3.5–5.1)
Sodium: 138 mmol/L (ref 135–145)

## 2019-02-01 LAB — MAGNESIUM: Magnesium: 2.4 mg/dL (ref 1.7–2.4)

## 2019-02-01 LAB — PHOSPHORUS: Phosphorus: 2.2 mg/dL — ABNORMAL LOW (ref 2.5–4.6)

## 2019-02-01 LAB — VITAMIN B12: Vitamin B-12: 1237 pg/mL — ABNORMAL HIGH (ref 180–914)

## 2019-02-01 MED ORDER — POTASSIUM PHOSPHATES 15 MMOLE/5ML IV SOLN
20.0000 mmol | Freq: Two times a day (BID) | INTRAVENOUS | Status: AC
  Administered 2019-02-01: 20 mmol via INTRAVENOUS
  Filled 2019-02-01 (×3): qty 6.67

## 2019-02-01 NOTE — Progress Notes (Signed)
Pt transferring to 6N to make bed for cardiac Pt.  Report called to recievning RN, will notifiy family

## 2019-02-01 NOTE — Progress Notes (Signed)
   Subjective: Terry Taylor is resting comfortably in bed.  He is in no acute distress.  No acute events overnight.  He denies any pain today.  Per the nurse he has not had any bowel movements, nausea or vomiting.  She thinks that he ate something last night when his sitter was here, he is unable to feed himself and needs assistance.   Spoke with family yesterday, they reported that they had a bad experience with 1 of the SNF would prefer if he was able to come home when he is discharged.  Objective:  Vital signs in last 24 hours: Vitals:   01/30/19 2050 01/31/19 0100 01/31/19 1948 02/01/19 0053  BP: (!) 171/85 (!) 141/79 127/87 125/74  Pulse: 67 67 70 70  Resp: 16 16 (!) 21 15  Temp: 98.5 F (36.9 C) 98.7 F (37.1 C) 98.2 F (36.8 C) 97.7 F (36.5 C)  TempSrc: Oral Axillary Axillary Axillary  SpO2: 95% 95% 97% 95%  Weight:  84.8 kg      General: Awake, alert, NAD Cardiac: RRR, no murmurs rubs or gallops Pulmonary: CTA BL, no wheezing rhonchi rales Abdomen: Distended abdomen, hypoactive bowel sounds, no tenderness Extremity: Lower extremity edema    Assessment/Plan:  Principal Problem:   Dehydration Active Problems:   Dementia (HCC)   Parkinson disease (HCC)   Abdominal distention   Gastroenteritis This is an 85 year old male with a history of COPD, CAD, Parkinsons, and dementia who presented on 1/30 diarrhea and abdominal distension, found to be hypotensive and had respiratory distress that improved with [place].  CBC, GI and c diff were negative. CT scan showed distended stomach and proximal and mid small bowel with decompressed distal and terminal ileum without transition, colon is fluid distended.  An NG tube had tried to be placed however it was unable pass it through.   Dehydration 2/2 diarrhea Ileus 2/2 unknown cause, possibly post-infectious Patient has remained afebrile, he has not had any bowel movements but was able to tolerate some soft foods yesterday. We started  the erythromycin yesterday to increase his bowel motility. We will continue this for now. If symptoms do not improve we will consult GI tomorrow to be evaluated for a possible decompression.  -Ova and parasite stool studies pending -Continue oral fluid intake -Continue D5 1/2 NS with K at 100 cc/hr -Continue zofran PRN -Replete electrolytes -Continue erythromycin 250 mg q8 hours x 5 days -Up and out of bed with assistance today -PT/OT recommended SNF on discharge however family wants him to return home when he is discharged, they do have a sitter at home.   Hypokalemia Hypophosphatemia: -Today K 3.6 and phosphorus 2.3 -Replete and recheck labs in AM  Parkinson's, dementia: -Continue quetiapine and carbidopa-levodopa BID  BPH: -continue home flomax  AKI: -Resolved -Monitor BMP  FEN: D5 1/2 NS with K @ 100cc/hr, replete lytes prn, Clear liquid diet VTE ppx: Lovenox  Code Status: FULL    Dispo: Anticipated discharge is pending clinical improvement, needs to be able to tolerate PO. PT/OT recommended SNF however patients family declined this and would like him to go home when discharged.   [name], MD 02/01/2019, 6:38 AM Pager: [phone]

## 2019-02-01 NOTE — Progress Notes (Signed)
CSW lvm with son to consult regarding discharge plan and recommendation of SNF.   CSW will follow up.   [place], [place]

## 2019-02-02 ENCOUNTER — Inpatient Hospital Stay (HOSPITAL_COMMUNITY): Payer: Medicare HMO

## 2019-02-02 DIAGNOSIS — R140 Abdominal distension (gaseous): Secondary | ICD-10-CM

## 2019-02-02 DIAGNOSIS — K567 Ileus, unspecified: Secondary | ICD-10-CM

## 2019-02-02 LAB — BASIC METABOLIC PANEL
Anion gap: 10 (ref 5–15)
BUN: 12 mg/dL (ref 8–23)
CO2: 22 mmol/L (ref 22–32)
Calcium: 7.6 mg/dL — ABNORMAL LOW (ref 8.9–10.3)
Chloride: 106 mmol/L (ref 98–111)
Creatinine, Ser: 1.08 mg/dL (ref 0.61–1.24)
GFR calc Af Amer: >60 mL/min (ref >60)
GFR calc non Af Amer: >60 mL/min (ref >60)
Glucose, Bld: 124 mg/dL — ABNORMAL HIGH (ref 70–99)
Potassium: 4 mmol/L (ref 3.5–5.1)
Sodium: 138 mmol/L (ref 135–145)

## 2019-02-02 LAB — FOLATE RBC
Folate, Hemolysate: >620 ng/mL
Folate, RBC: >1582 ng/mL (ref >498)
Hematocrit: 39.2 % (ref 37.5–51.0)

## 2019-02-02 LAB — MAGNESIUM: Magnesium: 2.1 mg/dL (ref 1.7–2.4)

## 2019-02-02 LAB — PHOSPHORUS: Phosphorus: 3.3 mg/dL (ref 2.5–4.6)

## 2019-02-02 MED ORDER — PHENOL 1.4 % MT LIQD: 1.0000 | OROMUCOSAL | Status: DC | PRN

## 2019-02-02 MED ORDER — LIDOCAINE VISCOUS HCL 2 % MT SOLN
OROMUCOSAL | Status: AC
  Filled 2019-02-02: qty 15

## 2019-02-02 NOTE — Progress Notes (Signed)
Internal Medicine Attending:   I saw and examined the patient. I reviewed the resident's note and I agree with the resident's findings and plan as documented in the resident's note.  Patient states that his abdomen feels tight today.  He denies any bowel movements or passing gas.  Patient initially made to the hospital with diarrhea and abdominal distention.  Imaging was consistent with an ileus.  Patient now has progressively worsening abdominal distention on exam with minimal bowel sounds.  N.p.o. for now.  We will follow-up GI evaluation.  Patient has been unable to tolerate NG tube placement.  He may benefit from a rectal tube placement as well as possible colonic decompression.  Will discuss with GI.  Continue with IV fluids for now.  Replete electrolytes as needed and maintain potassium greater than 4.  Patient will need to be ambulated as much as possible to help with his ileus.  No further work-up at this time.

## 2019-02-02 NOTE — Clinical Social Work Note (Addendum)
**  D/C plan is home with private caregivers and [facility] services. See assessment below.  Clinical Social Work Assessment  Patient Details  Name: Terry Taylor MRN: [id number] Date of Birth: 1928/11/16  Date of referral:  02/02/19               Reason for consult:  Discharge Planning                Permission sought to share information with:  Family Supports Permission granted to share information::  No(Patient was awake, sitting in a chair and allowed CSW to talk with Terry in Terry presence)  Name::        Agency::     Relationship::     Contact Information:     Housing/Transportation Living arrangements for the past 2 months:  Single Family Home Source of Information:  Adult Children(Terry Engineer, manufacturing systems) Patient Interpreter Needed:  None Criminal Activity/Legal Involvement Pertinent to Current Situation/Hospitalization:  No - Comment as needed Significant Relationships:  Adult Children Lives with:  Self(Per Terry, patient is never alone, has 24/7 care between family and paid caregivers) Do you feel safe going back to the place where you live?  Yes Need for family participation in patient care:  Yes (Comment)  Care giving concerns:  Per Terry, Brier Reid. Patient will have 24/7 care with private caregivers and the family also wants [facility] services.  Social Worker assessment / plan:  CSW visited room and talked with patient and Terry regarding Terry Taylor' discharge plan. Per Terry, he wanted to talk with a SW to determine what was going on regarding Terry Taylor and Terry discharge. Terry Taylor reported that he is patient's POA and last year he went to court and is now Terry father's legal guardian. CSW requested that he bring guardianship paperwork and have someone make a copy so that it can go in patient's chart, and Terry was agreeable.   [name] indicated that the d/c plan is home and that Terry Taylor will have 24/7 care, along with [facility] services. He is requesting to use the same company they have been using.  [name] reported that Terry Taylor has been to a skilled nursing facility before and they were not pleased with the care and feel that he being at home will be better for Terry recovery.  Employment status:  Retired Forensic scientist:  Managed Medicare([employer]) PT Recommendations:  [place] / Referral to community resources:  Other (Comment Required)(None needed or requested as patient will return home with [facility] services and care by family)  Patient/Family's Response to care:  No concerns expressed by Terry regarding patient's care during hospitalization.  Patient/Family's Understanding of and Emotional Response to Diagnosis, Current Treatment, and Prognosis:  Terry and daughter very involved and desire to care for their father at home. They have also put things in place so that patient will have 24/7 care.  Emotional Assessment Appearance:  Appears stated age Attitude/Demeanor/Rapport:  Other(Quiet, appropriate) Affect (typically observed):  Appropriate, Quiet Orientation:  Oriented to Self, Oriented to Situation Alcohol / Substance use:  Tobacco Use(Per H&P, patient has never smoked. ) Psych involvement (Current and /or in the community):  No (Comment)  Discharge Needs  Concerns to be addressed:  Discharge Planning Concerns Readmission within the last 30 days:  No Current discharge risk:  None Barriers to Discharge:  Continued Medical Work up   [employer] Mila Homer, Star 02/02/2019, 5:26 PM

## 2019-02-02 NOTE — Progress Notes (Signed)
Physical Therapy Treatment Patient Details Name: Terry Taylor MRN: [id number] DOB: 12-29-1928 Today's Date: 02/02/2019    History of Present Illness Patient is a 85 y/o male presenting on 1/29 with abdominal pain and diarrhea. Recent admission and discharge 1/25. PMH signficant for CAD, COPD, and Parkinson's with dementia. Admitted for dehydration secondary to diarrhea.     PT Comments    Patient seen for mobility progression. Pt up in recliner upon arrival and session focused on functional transfers. Pt requires max/total A +2 to stand with RW/ gait belt/ bed pad and with use of Stedy standing frame. Pt able to maintain partial stand for brief periods of time. Continue to progress as tolerated.   Follow Up Recommendations  SNF;Supervision/Assistance - 24 hour     Equipment Recommendations  None recommended by PT    Recommendations for Other Services       Precautions / Restrictions Precautions Precautions: Fall    Mobility  Bed Mobility               General bed mobility comments: pt OOB in chair upon arrival   Transfers Overall transfer level: Needs assistance Equipment used: Rolling walker (2 wheeled) Transfers: Sit to/from Stand Sit to Stand: Max assist;+2 physical assistance         General transfer comment: multimodal cues for anterior translation of trunk and for sequencing/positioning; assistance required to power up into standing with RW and bed pad at hips and with use of Stedy; pt unable to stand upright or maintain standing for very long  Ambulation/Gait                 Stairs             Wheelchair Mobility    Modified Rankin (Stroke Patients Only)       Balance Overall balance assessment: Needs assistance Sitting-balance support: Bilateral upper extremity supported;Feet supported Sitting balance-Leahy Scale: Poor Sitting balance - Comments: posterior and right lateral lean; worked on trunk/hip flexion in sitting prior to  attempt to stand Postural control: Posterior lean;Right lateral lean Standing balance support: Bilateral upper extremity supported;During functional activity Standing balance-Leahy Scale: Zero                              Cognition Arousal/Alertness: Awake/alert Behavior During Therapy: Flat affect Overall Cognitive Status: History of cognitive impairments - at baseline(dementia)                                 General Comments: pt oriented to self only      Exercises      General Comments        Pertinent Vitals/Pain Pain Assessment: Faces Faces Pain Scale: Hurts little more Pain Location: abdomen Pain Descriptors / Indicators: Discomfort Pain Intervention(s): Limited activity within patient's tolerance;Monitored during session;Repositioned    Home Living                      Prior Function            PT Goals (current goals can now be found in the care plan section) Acute Rehab PT Goals Patient Stated Goal: none stated Progress towards PT goals: Progressing toward goals    Frequency    Min 2X/week      PT Plan Current plan remains appropriate    Co-evaluation  AM-PAC PT "6 Clicks" Mobility   Outcome Measure  Help needed turning from your back to your side while in a flat bed without using bedrails?: Total Help needed moving from lying on your back to sitting on the side of a flat bed without using bedrails?: Total Help needed moving to and from a bed to a chair (including a wheelchair)?: Total Help needed standing up from a chair using your arms (e.g., wheelchair or bedside chair)?: A Lot Help needed to walk in hospital room?: Total Help needed climbing 3-5 steps with a railing? : Total 6 Click Score: 7    End of Session Equipment Utilized During Treatment: Gait belt Activity Tolerance: Patient tolerated treatment well Patient left: with call bell/phone within reach;with family/visitor present;in  chair;with chair alarm set Nurse Communication: Mobility status PT Visit Diagnosis: Unsteadiness on feet (R26.81);Other abnormalities of gait and mobility (R26.89);Muscle weakness (generalized) (M62.81)     Time: 1425-1450 PT Time Calculation (min) (ACUTE ONLY): 25 min  Charges:  $Gait Training: 23-37 mins                     Earney Navy, PTA Acute Rehabilitation Services Pager: [phone] Office: [phone]     Darliss Cheney 02/02/2019, 5:03 PM

## 2019-02-02 NOTE — Care Management Note (Addendum)
Case Management Note  Patient Details  Name: Terry Taylor MRN: [id number] Date of Birth: 01/27/1928  Subjective/Objective:                    Action/Plan:Son present, would like to speak to SW regarding SNF, [name] will call the son.  Discussed discharge planning with patient and daughter [name] at bedside. They understand PT recommendation for SNF, however, they prefer home with home health through [facility] .  Patient already has "all" DME per daughter, lift chair, walker 3 in 1 . Family will provide transportation home.   Will need home health orders and face to face. Paged MD [name], MD  Pager: [phone] Expected Discharge Date:                  Expected Discharge Plan:  [place]  In-House Referral:     Discharge planning Services  CM Consult  Post Acute Care Choice:  [place], Resumption of Svcs/PTA Provider Choice offered to:  Patient, Adult Children  DME Arranged:  N/A DME Agency:  NA  HH Arranged:  RN, PT [place] Agency:  [facility]  Status of Service:  In process, will continue to follow  If discussed at Long Length of Stay Meetings, dates discussed:    Additional Comments:  [name], RN 02/02/2019, 1:23 PM

## 2019-02-02 NOTE — Progress Notes (Signed)
PT Cancellation Note  Patient Details Name: Terry Taylor MRN: [id number] DOB: 06-07-1928   Cancelled Treatment:    Reason Eval/Treat Not Completed: Patient at procedure or test/unavailable PT will continue to follow acutely.   Salina April, PTA Acute Rehabilitation Services Pager: [phone] Office: [phone]   02/02/2019, 10:59 AM

## 2019-02-02 NOTE — Consult Note (Addendum)
Referring Provider: Dr. Aldine Contes Primary Care Physician:  Alvester Chou, NP Primary Gastroenterologist:  Dr. Loletha Carrow  Reason for Consultation: Ileus with abdominal distension   HPI: Terry Taylor is a 85 y.o. male with a history of Parkinson's Disease, dementia, ? CVA, COPD,sleep apnea, CAD,  HTN and GERD who returned to the ED on 01/29/2019 with SOB, diarrhea and abdominal distension. His daughter and caregiver reported he initially developed diarrhea 01/17/2019. He had numerous watery diarrhea stools daily "nonstop" for  1 week which progressively worsened with associated generalized weakness. He presented to the  ED on 01/24/2019. He was dehydrated. Bun 26. Cr. 1.22. He received IV fluids.  An abdominal/pelvic CT showed the colon was predominately fluid filled consistent with the given clinical history of diarrhea and the small bowel had some mildly prominent loops within the mid abdomen with mild fecalization of small bowel contents suggestive of a developing ileus. No obstructing lesions.  He did not have any further diarrhea and his labs were stable therfore he was discharged home 01/25/2019. He did not pass a BM for the next 3 days. His diarrhea recurred 01/28/2019 with associated right abdominal pain and shortness of breath. He received one dose of Loperamide 94m po. His abdominal pain and diarrhea worsened.  He presented back to the ED 01/28/2019. He was hypoxic with O2 sats in the 80's. Hypotensive BP 76/55. .Marland Kitchene was placed on Cpap and his O2 sats improved then weaned down to 2L [place] with sats 96%. CXR no acute findings. He received IV fluids and his BP stabilized.  Labs:K 3.3. BUN 19. Cr. 1.30. AST 13. ALT 8. Alk phos 48. T.bili 0.9.  Albumin 3.2. Ammonia 38. BNP 183.7. WBC 7.1. Hg 16.0. HCT 47. PLT 251.  An abdominal/pelvic CT with IV contrast showed a distended stomach and proximal small bowel with decompressed distal and terminal ileum, no discrete transition zone, fluid distended colon  favoring adynamic ileus verses partial small bowel obstruction, cholelithiasis, bilateral renal cysts, enlarged prostate, trace bilateral effusions, descending and proximal sigmoid diverticulosis. An NGT placement was attempted but the tube could not be passed. He received a few doses of Erythromycin for motility without improvement. Gi panel and C.diff PCR negative. O & P pending. An abdominal xray today showed scattered large and small bowel gas is noted. The stomach is significantly distended with air. No obstructive changes are noted. No free air is noted. He is NPO. GI  Consult requested for further evaluation and decompression.   Never had a colonoscopy.   Past Medical History:  Diagnosis Date  . Anxiety   . Arthritis    "bad in his knees" (01/09/2017)  . Benign prostatic hypertrophy    hx  . Chronic airway obstruction, not elsewhere classified   . Chronic bronchitis ([facility]   . Coronary atherosclerosis of unspecified type of vessel, native or graft   . GERD (gastroesophageal reflux disease)   . [place] onset atrial fibrillation ([facility] 01/08/2017   /[name]1/08/2017  . On home oxygen therapy    "prn" (01/09/2017)  . Osteoarthrosis, unspecified whether generalized or localized, unspecified site   . Parkinson's disease ([facility]    with dementia and psychosis.   . Pneumonia    "more than once" (01/09/2017)  . Pure hypercholesterolemia    IIA  . Skin cancer of face   . Stroke ([facility]    "they think he had a minor stroke 1-2 yr ago" (01/09/2017)  . Unspecified essential hypertension     Past Surgical  History:  Procedure Laterality Date  . CERVICAL DISCECTOMY    . FRACTURE SURGERY    . SKIN CANCER DESTRUCTION Right    "face"  . TIBIA FRACTURE SURGERY Right    "he's got a pin in there"    Prior to Admission medications   Medication Sig Start Date End Date Taking? Authorizing Provider  acetaminophen (TYLENOL) 500 MG tablet Take 500 mg by mouth at bedtime.   Yes [provider]    carbidopa-levodopa (SINEMET IR) 25-100 MG tablet Take 1 tablet by mouth 2 (two) times daily.   Yes [provider]  CRANBERRY PO Take 1 tablet by mouth daily.   Yes [provider]  isosorbide mononitrate (IMDUR) 30 MG 24 hr tablet Take 0.5 tablets (15 mg total) by mouth daily. 09/13/16  Yes Ghimire, Henreitta Leber, MD  loperamide (IMODIUM A-D) 2 MG tablet Take 2 mg by mouth 4 (four) times daily as needed for diarrhea or loose stools.   Yes [provider]  Melatonin 3 MG TABS Take 3-6 mg by mouth at bedtime.   Yes [provider]  naphazoline-glycerin (CLEAR EYES) 0.012-0.2 % SOLN Place 1-2 drops into both eyes 4 (four) times daily as needed for eye irritation. Patient taking differently: Place 1-2 drops into both eyes See admin instructions. Instill 1-2 drops into both eyes at bedtime for redness, and as needed daily for irritation 10/09/17  Yes Rai, Ripudeep K, MD  NON FORMULARY Take 1 capsule by mouth See admin instructions. OnGuard Immune Defense softgel capsules: Take 1 capsule by mouth once a day   Yes [provider]  NON FORMULARY Take 1 packet by mouth See admin instructions. Melaleuca AM and PM Longevity vitamin packets: Take 1 packet by mouth in the morning and 1 packet in the evening   Yes [provider]  QUEtiapine (SEROQUEL) 25 MG tablet Take 25 mg by mouth See admin instructions. Take 25 mg by mouth at bedtime and an additional 25 mg in the morning if hallucinating 12/26/18  Yes [provider]  tamsulosin (FLOMAX) 0.4 MG CAPS capsule Take 0.4 mg by mouth daily.   Yes [provider]  feeding supplement, ENSURE ENLIVE, (ENSURE ENLIVE) LIQD Take 237 mLs by mouth 2 (two) times daily between meals. Patient not taking: Reported on 01/23/2019 10/12/17   Mendel Corning, MD    Current Facility-Administered Medications  Medication Dose Route Frequency Provider Last Rate Last Dose  . acetaminophen (TYLENOL) tablet 650 mg   650 mg Oral Q6H PRN Welford Roche, MD   650 mg at 01/30/19 1508   Or  . acetaminophen (TYLENOL) suppository 650 mg  650 mg Rectal Q6H PRN Welford Roche, MD      . benzonatate (TESSALON) capsule 100 mg  100 mg Oral BID PRN [name], MD      . carbidopa-levodopa (SINEMET IR) 25-100 MG per tablet immediate release 1 tablet  1 tablet Oral BID Welford Roche, MD   1 tablet at 02/02/19 [phone]  . dextrose 5 % and 0.45 % NaCl with KCl 20 mEq/L infusion   Intravenous Continuous [name], MD 100 mL/hr at 02/02/19 1006    . diatrizoate meglumine-sodium (GASTROGRAFIN) 66-10 % solution 90 mL  90 mL Per NG tube Once Welford Roche, MD      . enoxaparin (LOVENOX) injection 40 mg  40 mg Subcutaneous Q24H Welford Roche, MD   40 mg at 02/02/19 1008  . naphazoline-glycerin (CLEAR EYES REDNESS) ophth solution 1-2 drop  1-2 drop Both Eyes QID PRN Welford Roche, MD   2 drop at 01/31/19 2202  . promethazine (PHENERGAN) tablet 25 mg  25 mg Oral Q6H PRN [name], MD      . QUEtiapine (SEROQUEL) tablet 25 mg  25 mg Oral QHS Welford Roche, MD   25 mg at 02/01/19 2054  . sodium chloride flush (NS) 0.9 % injection 3 mL  3 mL Intravenous Q12H Santos-Sanchez, Idalys, MD   3 mL at 02/02/19 1008  . tamsulosin (FLOMAX) capsule 0.4 mg  0.4 mg Oral Daily Santos-Sanchez, Idalys, MD   0.4 mg at 02/02/19 1008    Allergies as of 01/28/2019 - Review Complete 01/28/2019  Allergen Reaction Noted  . Bee venom Anaphylaxis 01/09/2017  . Adhesive [tape] Other (See Comments) 01/23/2019    Family History  Problem Relation Age of Onset  . Stroke Mother   . Stroke Father   . Diabetes Brother   . Other Daughter        adopted    Social History   Socioeconomic History  . Marital status: Married    Spouse name: Not on file  . Number of children: 2  . Years of education: Not on file  . Highest education level: Not on file  Occupational History   . Occupation: retired  Scientific laboratory technician  . Financial resource strain: Not on file  . Food insecurity:    Worry: Not on file    Inability: Not on file  . Transportation needs:    Medical: Not on file    Non-medical: Not on file  Tobacco Use  . Smoking status: Former Smoker    Years: 40.00    Types: Cigarettes    Last attempt to quit: 1979    Years since quitting: 41.1  . Smokeless tobacco: Never Used  Substance and Sexual Activity  . Alcohol use: Not Currently  . Drug use: No  . Sexual activity: Never  Lifestyle  . Physical activity:    Days per week: Patient refused    Minutes per session: Patient refused  . Stress: Not on file  Relationships  . Social connections:    Talks on phone: Patient refused    Gets together: Patient refused    Attends religious service: Patient refused    Active member of club or organization: Patient refused    Attends meetings of clubs or organizations: Patient refused    Relationship status: Patient refused  . Intimate partner violence:    Fear of current or ex partner: Patient refused    Emotionally abused: Patient refused    Physically abused: Patient refused    Forced sexual activity: Patient refused  Other Topics Concern  . Not on file  Social History Narrative   Retired, married.     Review of Systems: See HPI, patient has dementia  Physical Exam: Vital signs in last 24 hours: Temp:  [98.2 F (36.8 C)-98.4 F (36.9 C)] 98.4 F (36.9 C) (02/03 1131) Pulse Rate:  [64-67] 67 (02/03 1131) Resp:  [18-20] 20 (02/03 1131) BP: (132-165)/(75-92) 165/92 (02/03 1131) SpO2:  [96 %-100 %] 96 % (02/03 1131) Weight:  [84.8 kg] 84.8 kg (02/03 0500) Last BM Date: 01/29/19 General:  Weak appearing, slightly SOB. Head:  Normocephalic and atraumatic. Eyes:  Sclera clear, no icterus.   Conjunctiva erythematous. Lungs:  Course upper airway otherwise clear.  Heart:  Regular rate and rhythm; no murmurs, clicks, rubs,  or gallops. Abdomen: Grossly  distended, generalized tenderness throughout, hypoactive  BS x 4 quads, patient is sitting up in chair at time of exam. Rectal:  Deferred as patient is sitting up in chair. Extremities:  Without clubbing or edema. Neurologic:  Alert to name, follows command appropriately. Skin:  Dry with scattered ecchymotic areas to upper and lower extremities, dressing to sacral area intact.  Psych:  Alert and cooperative, in NAD  Intake/Output from previous day: 02/02 0701 - 02/03 0700 In: 2911 [P.O.:840; I.V.:2071] Out: -  Intake/Output this shift: No intake/output data recorded.  Lab Results: No results for input(s): WBC, HGB, HCT, PLT in the last 72 hours. BMET Recent Labs    02/01/19 0324 02/02/19 0651  NA 138 138  K 3.6 4.0  CL 105 106  CO2 23 22  GLUCOSE 126* 124*  BUN 10 12  CREATININE 0.98 1.08  CALCIUM 7.5* 7.6*    Studies/Results: Dg Abd 1 View  Result Date: 02/02/2019 CLINICAL DATA:  Abdominal distension EXAM: ABDOMEN - 1 VIEW COMPARISON:  01/31/2019 FINDINGS: Scattered large and small bowel gas is noted. The stomach is significantly distended with air. No obstructive changes are noted. No free air is noted. Degenerative changes of lumbar spine are seen. IMPRESSION: Changes most consistent with ileus. There is now gaseous distension of the stomach present as well. Electronically Signed   By: Inez Catalina M.D.   On: 02/02/2019 11:17    IMPRESSION:  1) Post infectious Ileus with gross abdominal distension, unlikely partial bowel obstruction 2) Generalized abdominal pain due to #1 3) Diarrhea, GI panel and C.diff negative, O & P pending  PLAN: 1) NPO 2) Re-attempt NGT placement 3) Continue  D5 1/2 NS _0 /hr 4) Further recommendations per Dr. Joseph Pierini Dorathy Daft  02/02/2019, 12:23 PM   I have reviewed the entire case in detail with the above APP and discussed the plan in detail.  Therefore, I agree with the diagnoses recorded above. In addition,  I have  personally interviewed and examined the patient and have personally reviewed any abdominal/pelvic CT scan images.  My additional thoughts are as follows:  This patient developed an acute severe diarrheal illness last week that seemed temporarily better after a few days, then recurred just as severely last week when he was admitted for a second time 5 days ago.  Its underlying cause is not yet known, but suspected to probably have been infectious, given the volume of diarrhea and the acuity of onset.  However, it has now led to a severe diffuse small bowel and colonic ileus.  He has market gastric distention on the CT scan several days ago and on KUB today.  He is at high risk of aspiration respiratory decompensation as a result.  The floor nurse was unable to pass an NG tube from time earlier this admission including earlier today. 2 nurses and I made several attempts to pass both 16 [place] and 12 [place] well lubricated NG tube in both nares with gentle pressure.  Unfortunately, the patient is unable to cooperate with this was frequently coughing.  I spoke with Dr. Sharyn Blitz of interventional radiology, who will contact the x-ray/fluoroscopy technician on call to place the NG tube under x-ray guidance.  Also left orders with the nursing to put the tube to suction once it is in place. Once this patient's ileus has resolved, we will reevaluate the need for any work-up of ongoing diarrhea.  However, the patient has reportedly not passed a bowel movement in several days since this ileus worsened.  We will follow closely.  Total time 80 minutes including extensive chart and imaging review, attempts at NG tube placement and discussion with radiology.   Nelida Meuse III Office:[phone]

## 2019-02-02 NOTE — Progress Notes (Signed)
   Subjective: No overnight events. This morning Terry Taylor reports that his stomach feels tight, particularly on the right side. He denies any BMs or passing gas. He endorses some nausea yesterday but none today. He was able to sit in the bedside chair a little bit yesterday.  Objective:  Vital signs in last 24 hours: Vitals:   02/01/19 0905 02/01/19 1012 02/01/19 2039 02/02/19 0500  BP: (!) 147/76 136/75 138/78   Pulse:  63 65   Resp: 17 (!) 22 19   Temp: 98.7 F (37.1 C) 98.6 F (37 C) 98.4 F (36.9 C)   TempSrc: Oral Oral Oral   SpO2:  95% 99%   Weight:    84.8 kg    General: laying comfortably in bed, no distress Cardiac: RRR, no mumurs Pulmonary: CTAB, normal effort Abdomen: distended compared to previous exams, mildly tender in the RUQ and RLQ, hypoactive bowel sounds Extremity: No LE edema   Assessment/Plan:  Principal Problem:   Dehydration Active Problems:   Dementia (HCC)   Parkinson disease (HCC)   Abdominal distention   Gastroenteritis  This is an 85 year old male with a history of COPD, CAD, Parkinsons, and dementia who presented on 1/30 diarrhea and abdominal distension, found to be hypotensive and had respiratory distress that improved with [place]. CBC, GI and c diff were negative. CT scan showed distended stomach and proximal and mid small bowel with decompressed distal and terminal ileum without transition, colon is fluid distended.An NG tube had tried to be placed however it was unable pass it through and he stopped having n/v so it was not re-ordered.  Dehydration 2/2 diarrhea Ileus 2/2 unknown cause, possibly post-infectious Patient has remained afebrile, he has not had any bowel movements.  Abdomen is more distended today compared to yesterday.  He did have a trial of erythromycin for possible chronic intestinal psuedo-obstruction however this did not help and this was stopped due to possibility of SBO. GI pathogen panel and C diff have been negative.  Patient is not on any opioid medications at home and he has not had any abdominal surgeries.  -Ova and parasite stool studiespending -NPO for now -Repeat abd x-ray -GI consult -Continue D5 1/2 NS with K at 100 cc/hr -Continue zofran PRN -Replete electrolytes -Up and out of bed with assistance today -PT/OT recommended SNF on discharge however family wants him to return home when he is discharged, they do have a sitter at home.  -Will contact case management to see if [place] would be available  Hypokalemia Hypophosphatemia: -Electrolytes stable today -Continue to monitor, daily BMP  Parkinson's, dementia: -Continue quetiapine and carbidopa-levodopa BID  BPH: -continue home flomax  AKI: -Resolved -Monitor BMP  FEN: D5 1/2 NS with K at 100cc/hr, replete lytes prn, NPO VTE ppx: Lovenox  Code Status: FULL  Dispo: Anticipated discharge is pending clinical improvement.   Terry Noble, MD 02/02/2019, 6:32 AM Pager: [phone]

## 2019-02-03 DIAGNOSIS — K567 Ileus, unspecified: Secondary | ICD-10-CM

## 2019-02-03 DIAGNOSIS — R197 Diarrhea, unspecified: Secondary | ICD-10-CM

## 2019-02-03 LAB — CBC
HCT: 39.6 % (ref 39.0–52.0)
Hemoglobin: 13.6 g/dL (ref 13.0–17.0)
MCH: 28.4 pg (ref 26.0–34.0)
MCHC: 34.3 g/dL (ref 30.0–36.0)
MCV: 82.7 fL (ref 80.0–100.0)
Platelets: 281 10*3/uL (ref 150–400)
RBC: 4.79 MIL/uL (ref 4.22–5.81)
RDW: 15.4 % (ref 11.5–15.5)
WBC: 10.7 10*3/uL — ABNORMAL HIGH (ref 4.0–10.5)
nRBC: 0 % (ref 0.0–0.2)

## 2019-02-03 LAB — RENAL FUNCTION PANEL
Albumin: 2.4 g/dL — ABNORMAL LOW (ref 3.5–5.0)
Anion gap: 10 (ref 5–15)
BUN: 13 mg/dL (ref 8–23)
CO2: 23 mmol/L (ref 22–32)
Calcium: 7.5 mg/dL — ABNORMAL LOW (ref 8.9–10.3)
Chloride: 106 mmol/L (ref 98–111)
Creatinine, Ser: 1.19 mg/dL (ref 0.61–1.24)
GFR calc Af Amer: >60 mL/min (ref >60)
GFR calc non Af Amer: 54 mL/min — ABNORMAL LOW (ref >60)
Glucose, Bld: 115 mg/dL — ABNORMAL HIGH (ref 70–99)
Phosphorus: 2.2 mg/dL — ABNORMAL LOW (ref 2.5–4.6)
Potassium: 4 mmol/L (ref 3.5–5.1)
Sodium: 139 mmol/L (ref 135–145)

## 2019-02-03 LAB — MAGNESIUM: Magnesium: 2 mg/dL (ref 1.7–2.4)

## 2019-02-03 MED ORDER — SODIUM PHOSPHATES 45 MMOLE/15ML IV SOLN
20.0000 mmol | Freq: Once | INTRAVENOUS | Status: AC
  Administered 2019-02-03: 20 mmol via INTRAVENOUS
  Filled 2019-02-03: qty 6.67

## 2019-02-03 MED ORDER — PROMETHAZINE HCL 25 MG/ML IJ SOLN: 6.2500 mg | Freq: Three times a day (TID) | INTRAMUSCULAR | Status: DC | PRN

## 2019-02-03 MED ORDER — POTASSIUM & SODIUM PHOSPHATES 280-160-250 MG PO PACK
2.0000 | PACK | ORAL | Status: DC
  Filled 2019-02-03 (×3): qty 2

## 2019-02-03 MED ORDER — OLANZAPINE 10 MG IM SOLR
5.0000 mg | Freq: Every day | INTRAMUSCULAR | Status: DC
  Administered 2019-02-03 – 2019-02-04 (×2): 5 mg via INTRAMUSCULAR
  Filled 2019-02-03 (×2): qty 10

## 2019-02-03 NOTE — Progress Notes (Signed)
Pharmacy Students rounding with the Internal Medicine Teaching Service/B1-[place] Service. Asked to provide parenteral alternatives for any of the current medications that the patient is receiving by mouth. Review of the current medication list reveals that only one medication, i.e. quetiapine/Seroquel is available in an alternative parenteral dosage form. The current PO dose of quetiapine/Seroquel is 25mg  PO daily at bedtime. Review of Lexicomp database shows an alternative product would be olanzapine/Zyprexa, consider lower starting dose of 2.5 to 5 mg daily for elderly patients; may increase as clinically indicated and tolerated with close monitoring of orthostatic blood pressure.  Dahlia Bailiff, Pharmacy Student, [place], Pharmacy Student, [place]

## 2019-02-03 NOTE — Progress Notes (Addendum)
   Subjective: Patient was doing okay today, no acute events overnight. He reports that he still has some abdominal pain but states that it is better. He reports that the NGT is bothering him and causing some irritation. No family at bedside.   Objective:  Vital signs in last 24 hours: Vitals:   02/02/19 1131 02/02/19 1329 02/02/19 2130 02/03/19 0453  BP: (!) 165/92 125/76 129/73 (!) 157/79  Pulse: 67 64 72 68  Resp: 20 20 16 16   Temp: 98.4 F (36.9 C) 98.7 F (37.1 C) 97.7 F (36.5 C) 97.7 F (36.5 C)  TempSrc: Oral Oral Oral   SpO2: 96% 97% 96% 98%  Weight:    87 kg    General: Frail appearing male, NAD, resting comfortably, NGT in place with bilious output Cardiac: RRR, no m/r/g Pulmonary: CTABL, no wheezing, rhonchi, or rales Abdomen: Distended, dull to percussion, no BS   Assessment/Plan:  Principal Problem:   Dehydration Active Problems:   Dementia (HCC)   Parkinson disease (HCC)   Abdominal distention   Gastroenteritis  This is an 85 year old male with a history of COPD, CAD, Parkinsons, and dementia who presented on 1/30 diarrhea and abdominal distension, found to be hypotensive and had respiratory distress that improved with [place]. CBC, GI and c diff were negative. CT scan showed distended stomach and proximal and mid small bowel with decompressed distal and terminal ileum without transition, colon is fluid distended.An NG tube had tried to be placed however it was unable pass it through and he stopped having n/v so it was not re-ordered.  Dehydration 2/2 diarrhea Ileus2/2 unknown cause, possibly post-infectious -Patient has remained afebrile. He did have a increased in his WBC to 10.7 today. He had an NGT placed yesterday under X-ray guidance and had a net output of 1350 cc from the NGT. NGT is hooked up to suction. Today patient is feeling better, abdomen is still distended however improved from yesterday.   -Ova and parasite is still pending -GI following,  appreciate recommendations -Continue D5 1/2 NS with K -Continue zofran PRN -PT/OT reccommends SNF however family would prefer him to go home. CSW assisting with HH needs.  -Replete electrolytes PRN -NPO -Up and out of bed -TSH in AM    Electrolyte abnormalities: -Phosphorus decreased to 2.2, will replete -Daily renal function and magnesium  Parkinson's, dementia: -Currently NPO, on quetiapine and carbidop-levodopa -Pharmacy assisting with switching to IV medications   AKI: -Improved. Small increase in Cr to 1.19 today from 1.09 yesterday. Will continue to monitor.  -Renal function test  FEN: D5 1/2 NS with K at 100cc/hr, replete lytes prn, NPO VTE ppx: Lovenox  Code Status: FULL    Dispo: Anticipated discharge is pending clinical improvement.   Terry Noble, MD 02/03/2019, 6:34 AM Pager: [phone]

## 2019-02-03 NOTE — Progress Notes (Addendum)
[facility] Progress Note  Chief Complaint: Ileus  History:  Patient cannot give history or review of systems due to his dementia. He underwent placement of a fluoroscopy guided NG tube last evening, with 1350 cc output of bilious fluid for. Rectal tube is no longer in place.  The patient is wearing protective mitts because he was pulling at tubes and lines. Family is not at the bedside today.  ROS: Unable to obtain, as noted above  Objective:  Med list reviewed  Vital signs in last 24 hrs: Vitals:   02/02/19 2130 02/03/19 0453  BP: 129/73 (!) 157/79  Pulse: 72 68  Resp: 16 16  Temp: 97.7 F (36.5 C) 97.7 F (36.5 C)  SpO2: 96% 98%    Physical Exam Chronically ill-appearing man, alert, visually tracking, mumbling  HEENT: sclera anicteric, oral mucosa dry.  NG tube in place  Cardiac: RRR without murmurs, S1S2 heard, no peripheral edema  Pulm: clear to auscultation bilaterally, normal RR and fair [place] effort  Abdomen: softly distended and tympanitic, much improved from last evening.  No apparent tenderness, but without active bowel sounds.   Skin; warm and dry, no jaundice Rectal: Decreased resting anal sphincter tone, no palpable anal or distal rectal lesions or apparent tenderness  Recent Labs:  Recent Labs  Lab 01/28/19 1302 01/28/19 1309 02/01/19 0324 02/03/19 0333  WBC 7.1  --   --  10.7*  HGB 15.9 16.0  --  13.6  HCT 48.5 47.0 39.2 39.6  PLT 251  --   --  281   No results for input(s): INR in the last 168 hours. CMP     Component Value Date/Time   NA 139 02/03/2019 0333   NA 138 10/08/2014 0527   K 4.0 02/03/2019 0333   K 3.8 10/08/2014 0527   CL 106 02/03/2019 0333   CL 106 10/08/2014 0527   CO2 23 02/03/2019 0333   CO2 24 10/08/2014 0527   GLUCOSE 115 (H) 02/03/2019 0333   GLUCOSE 77 10/08/2014 0527   BUN 13 02/03/2019 0333   BUN 20 (H) 10/08/2014 0527   CREATININE 1.19 02/03/2019 0333   CREATININE 1.21 10/08/2014 0527   CALCIUM 7.5  (L) 02/03/2019 0333   CALCIUM 7.9 (L) 10/08/2014 0527   PROT 6.6 01/28/2019 1302   ALBUMIN 2.4 (L) 02/03/2019 0333   AST 13 (L) 01/28/2019 1302   ALT 8 01/28/2019 1302   ALKPHOS 48 01/28/2019 1302   BILITOT 0.9 01/28/2019 1302   GFRNONAA 54 (L) 02/03/2019 0333   GFRNONAA >60 10/08/2014 0527   GFRAA >60 02/03/2019 0333   GFRAA >60 10/08/2014 5885     Radiologic studies: NG tube fluoroscopy report noted  @ASSESSMENTPLANBEGIN @ Assessment: Diffuse small bowel and colonic ileus that appears to have been triggered by acute severe diarrheal illness. Acute diarrhea of unclear cause but has now stopped due to the ileus.   Plan: Continue NG tube drainage to low intermittent suction Keep head of bed elevated at least 30 degrees at all times (order placed, patient  laying flat found him, but was raised up) Replace rectal tube in place to gravity drainage It would help to ambulate this patient if possible in order to improve his GI motility. Be sure to follow his BMP, adequately replacing fluid and electrolytes as needed. TSH ordered with tomorrow morning's labs. We will follow.  Conservative management warranted given his age and poor condition.  Nelida Meuse III Office: [phone]

## 2019-02-03 NOTE — Progress Notes (Signed)
  Date: 02/03/2019  Patient name: Terry Taylor  Medical record number: [id number]  Date of birth: [birth year]   I have seen and evaluated this patient and I have discussed the plan of care with the house staff. Please see Dr. Dorothyann Peng note for complete details. I concur with her findings.   NGT with significant amount of collection today, greenish, clear.  Continue NGT and fluids.  NPO.  GI following, reviewed recommendations.  Sid Falcon, MD 02/03/2019, 3:22 PM

## 2019-02-03 NOTE — Progress Notes (Signed)
Occupational Therapy Treatment Patient Details Name: Terry Taylor MRN: [id number] DOB: [birth year] Today's Date: 02/03/2019    History of present illness Patient is a 85 y/o male presenting on 1/29 with abdominal pain and diarrhea. Recent admission and discharge 1/25. PMH signficant for CAD, COPD, and Parkinson's with dementia. Admitted for dehydration secondary to diarrhea.    OT comments  This 85 yo male admitted with above presents to acute OT with following some commands with increased time and very stiff/rigid movements making it hard for him to move. He will continue to benefit from acute OT with at SNF to see if he can get more mobile and be able to A more with his care at home.  Follow Up Recommendations  SNF;Supervision/Assistance - 24 hour    Equipment Recommendations  None recommended by OT       Precautions / Restrictions Precautions Precautions: Fall Restrictions Weight Bearing Restrictions: No       Mobility Bed Mobility Overal bed mobility: Needs Assistance Bed Mobility: Rolling Rolling: Total assist         General bed mobility comments: pt will initate roll with one arm or other with increased cues and time but not follow through        ADL either performed or assessed with clinical judgement   ADL Overall ADL's : Needs assistance/impaired     Grooming: Moderate assistance;Wash/dry face;Bed level Grooming Details (indicate cue type and reason): Could not get to forehead                                               Cognition Arousal/Alertness: Awake/alert Behavior During Therapy: Flat affect Overall Cognitive Status: History of cognitive impairments - at baseline(dementia)                                 General Comments: pt oriented to name and birthdate but nothing else                   Pertinent Vitals/ Pain       Pain Assessment: Faces Pain Score: 0-No pain         Frequency  Min 2X/week            Plan Discharge plan remains appropriate       AM-PAC OT "6 Clicks" Daily Activity     Outcome Measure   Help from another person eating meals?: Total(NPO) Help from another person taking care of personal grooming?: Total Help from another person toileting, which includes using toliet, bedpan, or urinal?: Total Help from another person bathing (including washing, rinsing, drying)?: Total Help from another person to put on and taking off regular upper body clothing?: Total Help from another person to put on and taking off regular lower body clothing?: Total 6 Click Score: 6    End of Session    OT Visit Diagnosis: Other abnormalities of gait and mobility (R26.89);Muscle weakness (generalized) (M62.81);Other symptoms and signs involving cognitive function   Activity Tolerance Patient tolerated treatment well   Patient Left in bed;with call bell/phone within reach;with bed alarm set   Nurse Communication (NT: pt was wet due to condom cath off--she came in and we cleaned pt and linens)        Time: 5638-7564 OT Time Calculation (min): 30 min  Charges: OT General Charges $OT Visit: 1 Visit OT Treatments $Self Care/Home Management : 23-37 mins  [place] Circle, OTR/L Acute [employer] Pager [phone] Office [phone]      Almon Register 02/03/2019, 3:13 PM

## 2019-02-04 ENCOUNTER — Inpatient Hospital Stay (HOSPITAL_COMMUNITY): Payer: Medicare HMO

## 2019-02-04 DIAGNOSIS — E860 Dehydration: Secondary | ICD-10-CM

## 2019-02-04 DIAGNOSIS — R935 Abnormal findings on diagnostic imaging of other abdominal regions, including retroperitoneum: Secondary | ICD-10-CM

## 2019-02-04 LAB — MAGNESIUM: Magnesium: 2 mg/dL (ref 1.7–2.4)

## 2019-02-04 LAB — RENAL FUNCTION PANEL
Albumin: 2.2 g/dL — ABNORMAL LOW (ref 3.5–5.0)
Anion gap: 5 (ref 5–15)
BUN: 9 mg/dL (ref 8–23)
CO2: 26 mmol/L (ref 22–32)
Calcium: 7.3 mg/dL — ABNORMAL LOW (ref 8.9–10.3)
Chloride: 108 mmol/L (ref 98–111)
Creatinine, Ser: 1.06 mg/dL (ref 0.61–1.24)
GFR calc Af Amer: >60 mL/min (ref >60)
GFR calc non Af Amer: >60 mL/min (ref >60)
Glucose, Bld: 103 mg/dL — ABNORMAL HIGH (ref 70–99)
Phosphorus: 2.9 mg/dL (ref 2.5–4.6)
Potassium: 4 mmol/L (ref 3.5–5.1)
Sodium: 139 mmol/L (ref 135–145)

## 2019-02-04 LAB — CBC
HCT: 37.4 % — ABNORMAL LOW (ref 39.0–52.0)
HEMOGLOBIN: 12.8 g/dL — AB (ref 13.0–17.0)
MCH: 28.7 pg (ref 26.0–34.0)
MCHC: 34.2 g/dL (ref 30.0–36.0)
MCV: 83.9 fL (ref 80.0–100.0)
Platelets: 263 10*3/uL (ref 150–400)
RBC: 4.46 MIL/uL (ref 4.22–5.81)
RDW: 15.5 % (ref 11.5–15.5)
WBC: 9.5 10*3/uL (ref 4.0–10.5)
nRBC: 0 % (ref 0.0–0.2)

## 2019-02-04 LAB — TSH: TSH: 2.285 u[IU]/mL (ref 0.350–4.500)

## 2019-02-04 MED ORDER — OLANZAPINE 10 MG IM SOLR
10.0000 mg | Freq: Every day | INTRAMUSCULAR | Status: DC
  Filled 2019-02-04: qty 10

## 2019-02-04 MED ORDER — ORAL CARE MOUTH RINSE
15.0000 mL | Freq: Two times a day (BID) | OROMUCOSAL | Status: DC
  Administered 2019-02-04 – 2019-02-09 (×11): 15 mL via OROMUCOSAL

## 2019-02-04 NOTE — Progress Notes (Signed)
   Subjective: No overnight events. Patient has family at bedside today. He is concerned about where his wallet is this morning and family assures him that they have it. He reports that he feels "rough" today, but his abdominal pain has improved. He denies nausea or flatus. His family asked about why he can't have his seroquel, and the team explained that zyprexa was started in lieu of the seroquel while he is NPO. Patient was still having some confusion yesterday so we discussed that we can increase his zyprexa today.   Objective:  Vital signs in last 24 hours: Vitals:   02/03/19 0453 02/03/19 1319 02/03/19 2106 02/04/19 0540  BP: (!) 157/79 (!) 161/81 (!) 148/70 122/60  Pulse: 68 67 62 63  Resp: 16 10 18 18   Temp: 97.7 F (36.5 C) 99.3 F (37.4 C) 99.6 F (37.6 C) 98.8 F (37.1 C)  TempSrc:  Oral Oral Oral  SpO2: 98% 98% 96% 92%  Weight: 87 kg       General: lying comfortably in place, NG tube in place with bilious output, mittens on, gravelly voice  Cardiac: RRR, no murmurs Abdomen: hypoactive bowel sounds, abdomen is softer than yesterday, non-tender  Assessment/Plan:  Principal Problem:   Dehydration Active Problems:   Dementia (HCC)   Parkinson disease (HCC)   Abdominal distension   Gastroenteritis   Ileus (HCC)  This is an 85 year old male with a history of COPD, CAD, Parkinsons, and dementia who presented on 1/30 diarrhea and abdominal distension, found to be hypotensive and had respiratory distress that improved with [place]. CBC, GI and c diff were negative. CT scan showed distended stomach and proximal and mid small bowel with decompressed distal and terminal ileum without transition, colon is fluid distended.An NG tube had tried to be placed however it was unable pass it through and he stopped having n/v so it was not re-ordered.  Dehydration 2/2 diarrhea Ileus2/2 unknown cause, possibly post-infectious -Patient has remained afebrile, no leukoctosis. He has an NGT  in place, only minimal output. Today his abdomen exam was less distended, non-tender with hypoactive bowel sounds. He is still having bilious output from the NGT. Would recommend continuing NGT for now, and consider clamping trial tomorrow.  -Ova and parasite are still pending -GI following, appreciate recommendations -Continue D5 1/2 NS with K -Continue zofran PRN -PT/OT reccommends SNF however family would prefer him to go home. CSW assisting with HH needs, appreciate assistance.  -Replete electrolytes PRN -NPO with NGT -Up and out of bed -TSH was WNL.     Electrolyte abnormalities: -Phosphorus, mag and K were all WNL. Will continue to monitor and replete as needed.  -Daily renal function and magnesium  Parkinson's, dementia: -Currently NPO, on quetiapine and carbidop-levodopa at home -Started on Zyprexa 5 mg IM yesterday, increase to 10mg  today - Restart home meds when he is no longer NPO   AKI: -Improved and resolved. Likely 2/2 dehydration.  -Daily renal function test  FEN: D5 1/2 NS with K at 100cc/hr, replete lytes prn, NPO VTE ppx: Lovenox  Code Status: FULL    Dispo: Anticipated discharge is pending clinical improvement.   [name], MD 02/04/2019, 6:36 AM Pager: [phone]

## 2019-02-04 NOTE — Progress Notes (Signed)
  Date: 02/04/2019  Patient name: Terry Taylor  Medical record number: [id number]  Date of birth: 04-18-1928   I have seen and evaluated this patient and I have discussed the plan of care with the house staff. Please see Dr. Dorothyann Peng note for complete details. I concur with her findings and plan.  Reviewed GI notes and AXR.  AXR is improved, gas patter non obstructive.  Would trial clamp of NGT today and see if nausea/pain/emesis occurs.    Sid Falcon, MD 02/04/2019, 3:05 PM

## 2019-02-04 NOTE — Progress Notes (Addendum)
[facility] Progress Note  CC:  Ileus  Subjective:  Abdomen much less distended per caregivers.  Patient says abdominal pain is much better.  TSH normal.  Only 300 cc per NGT documented last by nursing.  Has flexiseal in place since yesterday but nothing in the bag.  Objective:  Vital signs in last 24 hours: Temp:  [98.8 F (37.1 C)-99.6 F (37.6 C)] 98.8 F (37.1 C) (02/05 0540) Pulse Rate:  [62-67] 63 (02/05 0540) Resp:  [10-18] 18 (02/05 0540) BP: (122-161)/(60-81) 122/60 (02/05 0540) SpO2:  [92 %-98 %] 92 % (02/05 0540) Last BM Date: 01/29/19 General:  Alert, frail, in NAD Heart:  Regular rate and rhythm; no murmurs Pulm:  CTAB.  No increased WOB. Abdomen:  Soft, still somewhat distended.  BS present but quiet and sparse.  Non-tender. Extremities:  Without edema.  Intake/Output from previous day: 02/04 0701 - 02/05 0700 In: 4540.2 [I.V.:4450.2; NG/GT:90] Out: 1250 [Urine:950; Emesis/NG output:300]  Lab Results: Recent Labs    02/03/19 0333 02/04/19 0350  WBC 10.7* 9.5  HGB 13.6 12.8*  HCT 39.6 37.4*  PLT 281 263   BMET Recent Labs    02/02/19 0651 02/03/19 0333 02/04/19 0350  NA 138 139 139  K 4.0 4.0 4.0  CL 106 106 108  CO2 22 23 26   GLUCOSE 124* 115* 103*  BUN 12 13 9   CREATININE 1.08 1.19 1.06  CALCIUM 7.6* 7.5* 7.3*   LFT Recent Labs    02/04/19 0350  ALBUMIN 2.2*   Dg Abd 1 View  Result Date: 02/02/2019 CLINICAL DATA:  Abdominal distension EXAM: ABDOMEN - 1 VIEW COMPARISON:  01/31/2019 FINDINGS: Scattered large and small bowel gas is noted. The stomach is significantly distended with air. No obstructive changes are noted. No free air is noted. Degenerative changes of lumbar spine are seen. IMPRESSION: Changes most consistent with ileus. There is now gaseous distension of the stomach present as well. Electronically Signed   By: Inez Catalina M.D.   On: 02/02/2019 11:17   Dg Addison Bailey G Tube Plc W/fl W/rad  Result Date:  02/02/2019 CLINICAL DATA:  85 year old male with gastric distention and multiple unsuccessful attempts to pass and NG tube at the bedside. EXAM: NASO G TUBE PLACEMENT WITH FL AND WITH RAD CONTRAST:  None. FLUOROSCOPY TIME:  Fluoroscopy Time:  1 minutes 12 seconds Radiation Exposure Index (if provided by the fluoroscopic device): Number of Acquired Spot Images: 0 COMPARISON:  CT Abdomen and Pelvis 01/28/2019.  Head CT 01/23/2019. FINDINGS: The procedure was discussed with the patient. The LEFT nare was chosen due to rightward nasal septal deviation and spurring on the comparison Head CT. The lubricated NG tube was passed easily through the left nare and into the pharynx, but once in the pharynx - with the patient unable to readily swallow - the tube kept looping upon itself in the hypopharynx and was initially unable to be passed. Fortunately, after multiple attempts at repositioning the tube tip the patient swallowed the tube and resistance ceased. The patient became more comfortable. The tip was then observed to proceed in the midline caudal to the carina. No resistance to the advancement of the tube into the stomach. Bilious fluid then was observed refluxing through the tube, and tube position at the stomach was confirmed with fluoroscopy. The tube was then connected to suction, and gastric decompression was observed fluoroscopically (series 4). IMPRESSION: Successful NG tube placement via the left nare into the stomach with the help of  fluoroscopy. The tube was connected to suction and gastric decompression observed as bilious appearing fluid was returned. Electronically Signed   By: Genevie Ann M.D.   On: 02/02/2019 20:28   Assessment / Plan: *Diffuse small bowel and colonic ileus that appears to have been triggered by acute severe diarrheal illness.  Seems to be improving. *Acute diarrhea of unclear cause but has now stopped due to the ileus.  -Will check abdominal x-ray today and then again in AM.  If  improved and continues with low NGT output then consider clamping NGT soon. -Will allow small amounts of ice chips today to keep mouth moist. -Will have flexi-seal removed. -Keep electrolytes WNL's.  K+ should be kept around 4.   LOS: 7 days   Laban Emperor. Zehr  02/04/2019, 10:45 AM  GI ATTENDING  Interval history and data reviewed.  Patient personally seen and examined.  At home caregivers at bedside.  Patient is somnolent but arousable and nonverbal.  No diarrhea.  NG tube output has decreased.  Reported to be about 300-400 over the past 24 hours.  Abdominal distention markedly improved.  Abdomen protuberant but soft.  Electrolytes improved.  White blood cell count normal.  All stool studies for pathogens have returned negative.  Agree with ongoing supportive care with IV fluids.  Would repeat abdominal films.  If okay, clamp NG tube to see how he tolerates this intervention.  GI will continue to follow.  Docia Chuck. Geri Seminole., M.D. [facility]

## 2019-02-04 NOTE — Plan of Care (Signed)
  Problem: Education: Goal: Knowledge of General Education information will improve Description: Including pain rating scale, medication(s)/side effects and non-pharmacologic comfort measures Outcome: Progressing   Problem: Clinical Measurements: Goal: Ability to maintain clinical measurements within normal limits will improve Outcome: Progressing   

## 2019-02-04 NOTE — Progress Notes (Signed)
Rectal tube removed per order.

## 2019-02-04 NOTE — Progress Notes (Signed)
Occupational Therapy Treatment Patient Details Name: Terry Taylor MRN: [id number] DOB: Feb 16, 1928 Today's Date: 02/04/2019    History of present illness Patient is a 85 y/o male presenting on 1/29 with abdominal pain and diarrhea. Recent admission and discharge 1/25. PMH signficant for CAD, COPD, and Parkinson's with dementia. Admitted for dehydration secondary to diarrhea.    OT comments  Pt is an 85 yo male s/p above dx. Pt demonstrating increased lethargy today, following 25% of commands throughout session and caregivers present for transfer today. Pt performing bed mobility +2 for trunk elevation and posterior lean. Pt requiring intermittent assist for sitting EOB x10 mins. STS requiring multiple attempts before stand pivot transfer performed +2 with near dependence. Pt tolerating recliner well and positioned for comfort- RN made aware of new arm tear. Pt would benefit from continued OT skilled services for ADL, mobility and safety in SNF setting. Pt's family wanting HH with 24/7 caregivers. Family advised to have +2 caregivers on staff with new deficits. OT to follow acutely.    Follow Up Recommendations  SNF;Supervision/Assistance - 24 hour    Equipment Recommendations  None recommended by OT    Recommendations for Other Services      Precautions / Restrictions Precautions Precautions: Fall Restrictions Weight Bearing Restrictions: No       Mobility Bed Mobility Overal bed mobility: Needs Assistance Bed Mobility: Rolling Rolling: Total assist Sidelying to sit: Total assist;+2 for physical assistance Supine to sit: Max assist;+2 for physical assistance;HOB elevated   Sit to sidelying: Total assist;+2 for physical assistance;HOB elevated General bed mobility comments: pt will initate roll with one arm or other with increased cues and time but not follow through  Transfers Overall transfer level: Needs assistance Equipment used: Rolling walker (2 wheeled) Transfers:  Sit to/from Stand Sit to Stand: Max assist;+2 physical assistance Stand pivot transfers: Max assist;+2 physical assistance       General transfer comment: Pt requires cues to attend to task    Balance Overall balance assessment: Needs assistance Sitting-balance support: Bilateral upper extremity supported;Feet supported         Standing balance-Leahy Scale: Zero                             ADL either performed or assessed with clinical judgement   ADL Overall ADL's : Needs assistance/impaired                                     Functional mobility during ADLs: Maximal assistance;+2 for physical assistance;Rolling walker(standpivot transfer nearly dependent ) General ADL Comments: maxA to totalA due to dementia at baseline     Vision   Vision Assessment?: No apparent visual deficits   Perception     Praxis      Cognition Arousal/Alertness: Lethargic Behavior During Therapy: Flat affect Overall Cognitive Status: History of cognitive impairments - at baseline                                          Exercises     Shoulder Instructions       General Comments pt able to follow 25% of commands; Ot sitting EOB x10 mins with intermittentt assist for leannig to right and posterior lean- sitting balance ranging from poor to fair.  Pertinent Vitals/ Pain       Pain Assessment: Faces Faces Pain Scale: Hurts a little bit Pain Intervention(s): Repositioned  Home Living                                          Prior Functioning/Environment              Frequency  Min 2X/week        Progress Toward Goals  OT Goals(current goals can now be found in the care plan section)  Progress towards OT goals: Progressing toward goals  Acute Rehab OT Goals Patient Stated Goal: none stated OT Goal Formulation: With patient/family Time For Goal Achievement: 02/12/19 Potential to Achieve Goals: Fair ADL  Goals Pt Will Perform Grooming: with min assist Pt Will Transfer to Toilet: with mod assist;with +2 assist;stand pivot transfer;bedside commode Additional ADL Goal #1: Pt will be able to maintain standing with Mod A +1 while another person A with clothing and peri care Additional ADL Goal #2: Pt will be able to roll left and right with Mod A to A with his care  Plan Discharge plan remains appropriate    Co-evaluation                 AM-PAC OT "6 Clicks" Daily Activity     Outcome Measure   Help from another person eating meals?: Total Help from another person taking care of personal grooming?: Total Help from another person toileting, which includes using toliet, bedpan, or urinal?: Total Help from another person bathing (including washing, rinsing, drying)?: Total Help from another person to put on and taking off regular upper body clothing?: Total Help from another person to put on and taking off regular lower body clothing?: Total 6 Click Score: 6    End of Session Equipment Utilized During Treatment: Gait belt;Rolling walker  OT Visit Diagnosis: Other abnormalities of gait and mobility (R26.89);Muscle weakness (generalized) (M62.81);Other symptoms and signs involving cognitive function   Activity Tolerance Patient tolerated treatment well   Patient Left in chair;with call bell/phone within reach;with chair alarm set;with family/visitor present   Nurse Communication Mobility status        Time: 2951-8841 OT Time Calculation (min): 41 min  Charges: OT General Charges $OT Visit: 1 Visit OT Treatments $Neuromuscular Re-education: 38-52 mins  Ebony Hail Harold Hedge) Marsa Aris OTR/L Acute Rehabilitation Services Pager: [phone] Office: [phone]    Fredda Hammed 02/04/2019, 1:56 PM

## 2019-02-05 ENCOUNTER — Inpatient Hospital Stay (HOSPITAL_COMMUNITY): Payer: Medicare HMO

## 2019-02-05 LAB — RENAL FUNCTION PANEL
Albumin: 2.2 g/dL — ABNORMAL LOW (ref 3.5–5.0)
Anion gap: 8 (ref 5–15)
BUN: 7 mg/dL — ABNORMAL LOW (ref 8–23)
CO2: 25 mmol/L (ref 22–32)
Calcium: 7.3 mg/dL — ABNORMAL LOW (ref 8.9–10.3)
Chloride: 104 mmol/L (ref 98–111)
Creatinine, Ser: 1.08 mg/dL (ref 0.61–1.24)
GFR calc Af Amer: >60 mL/min (ref >60)
GFR calc non Af Amer: >60 mL/min (ref >60)
Glucose, Bld: 103 mg/dL — ABNORMAL HIGH (ref 70–99)
Phosphorus: 2.9 mg/dL (ref 2.5–4.6)
Potassium: 4 mmol/L (ref 3.5–5.1)
Sodium: 137 mmol/L (ref 135–145)

## 2019-02-05 LAB — MAGNESIUM: Magnesium: 1.9 mg/dL (ref 1.7–2.4)

## 2019-02-05 MED ORDER — BISACODYL 10 MG RE SUPP
10.0000 mg | Freq: Once | RECTAL | Status: AC
  Administered 2019-02-05: 10 mg via RECTAL
  Filled 2019-02-05: qty 1

## 2019-02-05 NOTE — Progress Notes (Addendum)
[facility] Progress Note  CC:  Ileus  Subjective:  X-ray actually shows some progression of his ileus.  That was after NGT had been clamped overnight.  Abdomen still slightly distended but definitely nowhere near where it was initially.  Reports that he feels "bad".  A little abdominal pain.  No vomiting.  No BM's.  Objective:  Vital signs in last 24 hours: Temp:  [97.6 F (36.4 C)-98.5 F (36.9 C)] 98.3 F (36.8 C) (02/06 0416) Pulse Rate:  [55-65] 65 (02/06 0637) Resp:  [19-20] 19 (02/06 0416) BP: (97-143)/(73-111) 137/73 (02/06 0637) SpO2:  [95 %-100 %] 97 % (02/06 0416) Last BM Date: 01/29/19 General:  Alert, Well-developed, in NAD; chronically ill-appearing. Heart:  Regular rate and rhythm; no murmurs Pulm:  CTAB.  No increased WOB. Abdomen:  Slightly distended but soft.  BS present but quiet and sparse.  Minimal TTP. Extremities:  No edema noted in B/L LE's.  Intake/Output from previous day: 02/05 0701 - 02/06 0700 In: 2208.1 [I.V.:2038.1; NG/GT:170] Out: 2800 [Urine:2800]  Lab Results: Recent Labs    02/03/19 0333 02/04/19 0350  WBC 10.7* 9.5  HGB 13.6 12.8*  HCT 39.6 37.4*  PLT 281 263   BMET Recent Labs    02/03/19 0333 02/04/19 0350 02/05/19 0357  NA 139 139 137  K 4.0 4.0 4.0  CL 106 108 104  CO2 23 26 25   GLUCOSE 115* 103* 103*  BUN 13 9 7*  CREATININE 1.19 1.06 1.08  CALCIUM 7.5* 7.3* 7.3*  TSH normal  LFT Recent Labs    02/05/19 0357  ALBUMIN 2.2*   Dg Abd 1 View  Result Date: 02/04/2019 CLINICAL DATA:  Check nasogastric catheter placement EXAM: ABDOMEN - 1 VIEW COMPARISON:  None. FINDINGS: Gastric catheter is noted extending into the distal stomach and possibly the proximal aspect of the duodenum. No obstructive changes are seen. IMPRESSION: Gastric catheter within the stomach extending into what appears to be the proximal duodenum. Electronically Signed   By: Inez Catalina M.D.   On: 02/04/2019 14:20   Personally  reviewed the KUB from yesterday and today.  Assessment / Plan: *Diffuse small bowel and colonic ileus that appears to have been triggered by acute severe diarrheal illness.  Improved overall but abdominal x-ray this AM actually showed some progression after NGT clamped overnight. *Acute diarrhea of unclear cause but has now stopped due to the ileus.  -Will need to hook NGT back up to LIS. -Keep electrolytes WNL's.  K+ should be kept around 4. -Continue other conservative measures.   LOS: 8 days   Laban Emperor. Zehr  02/05/2019, 9:25 AM  I have discussed the case with the PA, and that is the plan I formulated. I personally interviewed and examined the patient.  CC: Prolonged ileus  Unfortunately, he failed a trial of clamping the NG tube, developing more distention and discomfort afterwards.  His tube has been put back to drainage.  His abdomen is softly distended and tympanitic with some bowel sounds, certainly better than when I first saw him in consultation earlier this week, but not as good as he was yesterday.  KUB confirms worsening of the ileus today compared to normal KUB yesterday.  He is in a very difficult spot given his advanced age, dementia and overall very poor condition.  He has now had no meaningful nutrition in a week.  I did not appreciate a fecal impaction on rectal exam the day of my consultation.  However, to  be sure there is no distal impaction, we will asked nursing to give him a Dulcolax suppository followed by a 1 L tap water enema.  If his family wishes to continue aggressive care, then he needs parenteral nutrition.  I think this would be a significant challenge given the need for central line in such a patient.  Total time 25 minutes  Nelida Meuse III Office: [phone]

## 2019-02-05 NOTE — Progress Notes (Signed)
   Subjective: No overnight events. Terry Taylor reports that he feels "bad" today. His speech is very difficult to understand this morning and he doesn't respond well to questions. Unable to determine if he has abdominal pain or flatus. No family at bedside  Objective:  Vital signs in last 24 hours: Vitals:   02/04/19 1519 02/04/19 2133 02/05/19 0416 02/05/19 0637  BP: (!) 143/92 97/76 (!) 131/111 137/73  Pulse: 63 62 (!) 55 65  Resp: 20 19 19    Temp: 97.6 F (36.4 C) 98.5 F (36.9 C) 98.3 F (36.8 C)   TempSrc: Oral Oral Oral   SpO2: 100% 95% 97%   Weight:        General: lying comfortably in bed, no distress, NG tube in place and clamped Cardiac: RRR, no murmurs Abdomen: hypoactive bowel sounds (improved from yesterday), mild distension, no ttp  Assessment/Plan:  Principal Problem:   Dehydration Active Problems:   Dementia (HCC)   Parkinson disease (HCC)   Abdominal distension   Gastroenteritis   Ileus (HCC)  This is an 85 year old male with a history of COPD, CAD, Parkinsons, and dementia who presented on 1/30 diarrhea and abdominal distension, found to be hypotensive and had respiratory distress that improved with [place]. CBC, GI and c diff were negative. CT scan showed distended stomach and proximal and mid small bowel with decompressed distal and terminal ileum without transition, colon is fluid distended.An NG tube had tried to be placed however it was unable pass it through and he stopped having n/v so it was not re-ordered.  Dehydration 2/2 diarrhea Ileus2/2 unknown cause, possibly post-infectious -Patient has remained afebrile and has had no leukocytosis.  The NG tube was placed and he had good output, repeat abdominal x-ray yesterday showed improvement in his abdominal distention.  NG tube was clamped last night and today he still has some abdominal distension however now has more bowel sounds.   -GI following, appreciate recommendations -Continue D5 1/2 NS with  K, can decrease this when we start diet -Continue zofran PRN -PT/OT reccommends SNF however family would prefer him to go home. CSW assisting with HH needs, appreciate assistance.  -Replete electrolytes PRN -Up and out of bed -X-ray this morning showed progression of ileus, put NGT on LIS, keep NPO  Electrolyte abnormalities: -Phosphorus, mag and K were all WNL today. Will continue to monitor and replete as needed.  -Daily renal function and magnesium  Parkinson's, dementia: -Currently NPO, onquetiapine and carbidop-levodopa at home -Continue Zyprexa 10 mg IM  - Restart home meds when he is no longer NPO  AKI: -Improved and resolved. Likely 2/2 dehydration.  -Daily renal function test  FEN:D5 1/2 NS with K at 100 cc/hr, replete lytes prn, NPO VTE ppx: Lovenox  Code Status: FULL   Dispo: Anticipated discharge is 1-2 days.  [name], MD 02/05/2019, 6:55 AM Pager: [phone]

## 2019-02-05 NOTE — Progress Notes (Signed)
Initial Nutrition Assessment  DOCUMENTATION CODES:   Not applicable  INTERVENTION:   -RD will follow for diet advancement and supplement as appropriate -If within goals of care, recommend initiation of nutrition support (ex TPN) as pt has been without adequate nutrition for 8 days  NUTRITION DIAGNOSIS:   Inadequate oral intake related to altered GI function as evidenced by NPO status.  GOAL:   Patient will meet greater than or equal to 90% of their needs  MONITOR:   Diet advancement, Labs, Weight trends, Skin, I & O's  REASON FOR ASSESSMENT:   NPO/Clear Liquid Diet    ASSESSMENT:   85 year old man with a history of Parkinson's, dementia, COPD, and CAD who was admitted with diarrhea and abdominal distention.  Pt admitted with abdominal distention with diarrhea.  2/3- NGT placed  Reviewed I/O's: -592 ml x 24 hours and +5.9 L since admission  NGT output: +170 ml x 24 hours  Per GI notes, pt failed clamping trials and KUB from today has worsened from previous imaging. NHT remain connected to low, intermittent suction. If plan for continued agressive care, MD recommending TPN.   Pt unable to provide history due to cognitive deficit. Pt allowed RD to perform physical exam, however, did not ask questions appropriate (responded to each question ("I'm paving my driveway. Do you have any feedback for me? Because he'll come back tomorrow").   Reviewed wt hx, which reveals wt gain.   Pt without adequate nutrition over the past 8 days. If within goals of care and family desire aggressive care, consider initiation of TPN.   Labs reviewed.   NUTRITION - FOCUSED PHYSICAL EXAM:    Most Recent Value  Orbital Region  No depletion  Upper Arm Region  Mild depletion  Thoracic and Lumbar Region  No depletion  Buccal Region  No depletion  Temple Region  No depletion  Clavicle Bone Region  No depletion  Clavicle and Acromion Bone Region  No depletion  Scapular Bone Region  No  depletion  Dorsal Hand  No depletion  Patellar Region  No depletion  Anterior Thigh Region  No depletion  Posterior Calf Region  No depletion  Edema (RD Assessment)  Mild  Hair  Reviewed  Eyes  Reviewed  Mouth  Reviewed  Skin  Reviewed  Nails  Reviewed       Diet Order:   Diet Order            Diet NPO time specified  Diet effective now              EDUCATION NEEDS:   Not appropriate for education at this time  Skin:  Skin Assessment: Skin Integrity Issues: Skin Integrity Issues:: Stage II Stage II: sacrum, buttocks  Last BM:  02/04/19  Height:   Ht Readings from Last 1 Encounters:  01/23/19 6' (1.829 m)    Weight:   Wt Readings from Last 1 Encounters:  02/03/19 87 kg    Ideal Body Weight:  80.9 kg  BMI:  Body mass index is 26 kg/m.  Estimated Nutritional Needs:   Kcal:  1700-1900  Protein:  85-100 grams  Fluid:  1.7-1.9 L    Chayse Zatarain A. Jimmye Norman, RD, LDN, CDE Pager: [phone] After hours Pager: [phone]

## 2019-02-05 NOTE — Progress Notes (Signed)
Physical Therapy Treatment Patient Details Name: Terry Taylor MRN: [id number] DOB: 10-Dec-1928 Today's Date: 02/05/2019    History of Present Illness Patient is a 85 y/o male presenting on 1/29 with abdominal pain and diarrhea. Recent admission and discharge 1/25. PMH signficant for CAD, COPD, and Parkinson's with dementia. Admitted for dehydration secondary to diarrhea.     PT Comments    Pt received in bed. +2 max assist required for bed mobility and sit to stand in stedy. Stedy utilized for bed to recliner transfer. Pt positioned in recliner with feet elevated at end of session. Recommendation of SNF at d/c remain appropriate. Unsure if family still plans to take pt home. If so, recommend +2 assist for all mobility and maximize HHPT services.    Follow Up Recommendations  SNF;Supervision/Assistance - 24 hour     Equipment Recommendations  None recommended by PT    Recommendations for Other Services       Precautions / Restrictions Precautions Precautions: Fall;Other (comment) Precaution Comments: NG tube    Mobility  Bed Mobility Overal bed mobility: Needs Assistance       Supine to sit: Max assist;+2 for physical assistance;HOB elevated     General bed mobility comments: helicopter method to pivot toward EOB. Minimal participation from pt to assist with elevating trunk.  Transfers Overall transfer level: Needs assistance   Transfers: Sit to/from Stand Sit to Stand: Max assist;+2 physical assistance Stand pivot transfers: Total assist;+2 physical assistance       General transfer comment: Use of bed pad to lift hips from bed to stand in stedy. Total/dependent transfer bed to recliner using stedy.  Ambulation/Gait             General Gait Details: unable   Stairs             Wheelchair Mobility    Modified Rankin (Stroke Patients Only)       Balance Overall balance assessment: Needs assistance Sitting-balance support: Bilateral upper  extremity supported;Feet supported Sitting balance-Leahy Scale: Poor Sitting balance - Comments: max assist to maintain sitting balance EOB Postural control: Posterior lean;Right lateral lean Standing balance support: Bilateral upper extremity supported;During functional activity Standing balance-Leahy Scale: Zero Standing balance comment: stedy for transfer                            Cognition Arousal/Alertness: Awake/alert Behavior During Therapy: Flat affect Overall Cognitive Status: History of cognitive impairments - at baseline                                        Exercises      General Comments General comments (skin integrity, edema, etc.): Pt's 2 home caregivers present in room.      Pertinent Vitals/Pain Pain Assessment: Faces Faces Pain Scale: Hurts a little bit Pain Location: abdomen Pain Descriptors / Indicators: Discomfort Pain Intervention(s): Repositioned;Monitored during session    Home Living                      Prior Function            PT Goals (current goals can now be found in the care plan section) Acute Rehab PT Goals Patient Stated Goal: none stated PT Goal Formulation: Patient unable to participate in goal setting Time For Goal Achievement: 02/12/19 Potential to Achieve Goals: Fair  Progress towards PT goals: Progressing toward goals    Frequency    Min 2X/week      PT Plan Current plan remains appropriate    Co-evaluation              AM-PAC PT "6 Clicks" Mobility   Outcome Measure  Help needed turning from your back to your side while in a flat bed without using bedrails?: Total Help needed moving from lying on your back to sitting on the side of a flat bed without using bedrails?: Total Help needed moving to and from a bed to a chair (including a wheelchair)?: Total Help needed standing up from a chair using your arms (e.g., wheelchair or bedside chair)?: A Lot Help needed to walk in  hospital room?: Total Help needed climbing 3-5 steps with a railing? : Total 6 Click Score: 7    End of Session Equipment Utilized During Treatment: Gait belt Activity Tolerance: Patient tolerated treatment well Patient left: in chair;with call bell/phone within reach;with chair alarm set;with family/visitor present;with restraints reapplied Nurse Communication: Mobility status PT Visit Diagnosis: Unsteadiness on feet (R26.81);Other abnormalities of gait and mobility (R26.89);Muscle weakness (generalized) (M62.81)     Time: 7711-6579 PT Time Calculation (min) (ACUTE ONLY): 28 min  Charges:  $Therapeutic Activity: 23-37 mins                     Lorrin Goodell, PT  Office # [phone] Pager [phone]    [name] 02/05/2019, 11:42 AM

## 2019-02-05 NOTE — Progress Notes (Signed)
  Date: 02/05/2019  Patient name: Terry Taylor  Medical record number: [id number]  Date of birth: 1928-05-20   I have seen and evaluated this patient and I have discussed the plan of care with the house staff. Please see Dr. Dorothyann Taylor note for complete details. I concur with her findings and plan with the following additions/corrections:   Reviewed Terry Taylor' note from GI.  Will need to discuss with family parenteral nutrition as patient actually worsened today based on imaging and he has had no nutrition for a week now.  GI team will try further decompression from below today as well.  Repeat AXR in the AM.   Terry Falcon, MD 02/05/2019, 2:19 PM

## 2019-02-05 NOTE — Care Management Important Message (Signed)
Important Message  Patient Details  Name: Terry Taylor MRN: [id number] Date of Birth: [birth year]   Medicare Important Message Given:  Yes    Orbie Pyo 02/05/2019, 2:37 PM

## 2019-02-06 ENCOUNTER — Inpatient Hospital Stay (HOSPITAL_COMMUNITY): Payer: Medicare HMO

## 2019-02-06 LAB — RENAL FUNCTION PANEL
Albumin: 2.3 g/dL — ABNORMAL LOW (ref 3.5–5.0)
Anion gap: 6 (ref 5–15)
BUN: 7 mg/dL — ABNORMAL LOW (ref 8–23)
CO2: 26 mmol/L (ref 22–32)
Calcium: 7.8 mg/dL — ABNORMAL LOW (ref 8.9–10.3)
Chloride: 104 mmol/L (ref 98–111)
Creatinine, Ser: 1.09 mg/dL (ref 0.61–1.24)
GFR calc Af Amer: >60 mL/min (ref >60)
GFR calc non Af Amer: >60 mL/min (ref >60)
Glucose, Bld: 110 mg/dL — ABNORMAL HIGH (ref 70–99)
Phosphorus: 2.8 mg/dL (ref 2.5–4.6)
Potassium: 4.2 mmol/L (ref 3.5–5.1)
Sodium: 136 mmol/L (ref 135–145)

## 2019-02-06 LAB — MAGNESIUM: Magnesium: 1.9 mg/dL (ref 1.7–2.4)

## 2019-02-06 MED ORDER — ADULT MULTIVITAMIN W/MINERALS CH
1.0000 | ORAL_TABLET | Freq: Every day | ORAL | Status: DC
  Administered 2019-02-06 – 2019-02-09 (×4): 1 via ORAL
  Filled 2019-02-06 (×3): qty 1

## 2019-02-06 MED ORDER — BISACODYL 5 MG PO TBEC: 5.0000 mg | DELAYED_RELEASE_TABLET | Freq: Every day | ORAL | Status: DC

## 2019-02-06 MED ORDER — ENSURE ENLIVE PO LIQD
237.0000 mL | Freq: Three times a day (TID) | ORAL | Status: DC
  Administered 2019-02-07 – 2019-02-08 (×2): 237 mL via ORAL

## 2019-02-06 MED ORDER — ERYTHROMYCIN ETHYLSUCCINATE 400 MG/5ML PO SUSR
400.0000 mg | Freq: Four times a day (QID) | ORAL | Status: AC
  Administered 2019-02-06 – 2019-02-07 (×2): 400 mg via ORAL
  Filled 2019-02-06 (×5): qty 5

## 2019-02-06 NOTE — Progress Notes (Addendum)
[facility] Progress Note  CC:  Prolonged ileus  Subjective:  Pulled out NGT overnight.  Has had no nutrition in over a week.  He received the dulcolax suppository yesterday and I am assuming that he received the tap water enema but I do not see documentation of that anywhere.  There are no BM's documented either.    Review of systems: Patient unable to provide review of systems due to his altered mental status.  Objective:  Vital signs in last 24 hours: Temp:  [98.2 F (36.8 C)-99.3 F (37.4 C)] 99 F (37.2 C) (02/07 0529) Pulse Rate:  [66-74] 66 (02/07 0529) Resp:  [20] 20 (02/07 0529) BP: (155-164)/(72-78) 155/72 (02/07 0529) SpO2:  [95 %-97 %] 95 % (02/06 1956) Last BM Date: 02/04/19 General:  Alert, chronically ill-appearing, in NAD Heart:  Regular rate and rhythm; no murmurs Pulm:  CTAB.  No increased WOB. Abdomen:  Soft, slightly distended.  More bowel sounds today.  Non-tender. Extremities:  Without edema.  Intake/Output from previous day: 02/06 0701 - 02/07 0700 In: 800 [I.V.:800] Out: 1000 [Urine:400; Emesis/NG output:600]  Lab Results: Recent Labs    02/04/19 0350  WBC 9.5  HGB 12.8*  HCT 37.4*  PLT 263   BMET Recent Labs    02/04/19 0350 02/05/19 0357 02/06/19 0510  NA 139 137 136  K 4.0 4.0 4.2  CL 108 104 104  CO2 26 25 26   GLUCOSE 103* 103* 110*  BUN 9 7* 7*  CREATININE 1.06 1.08 1.09  CALCIUM 7.3* 7.3* 7.8*   LFT Recent Labs    02/06/19 0510  ALBUMIN 2.3*   Dg Abd 1 View  Result Date: 02/05/2019 CLINICAL DATA:  Abdominal pain, ileus EXAM: ABDOMEN - 1 VIEW COMPARISON:  02/04/2019 FINDINGS: NG tube in the region of the duodenal bulb. This is unchanged in position. Distended large and small bowel loops with mild progression from the recent study. Findings most consistent with ileus as noted on recent CT. IMPRESSION: Mild progression of large and small bowel distention compatible with ileus. NG tip in the region the  duodenal bulb. Electronically Signed   By: Franchot Gallo M.D.   On: 02/05/2019 10:05   Dg Abd 1 View  Result Date: 02/04/2019 CLINICAL DATA:  Check nasogastric catheter placement EXAM: ABDOMEN - 1 VIEW COMPARISON:  None. FINDINGS: Gastric catheter is noted extending into the distal stomach and possibly the proximal aspect of the duodenum. No obstructive changes are seen. IMPRESSION: Gastric catheter within the stomach extending into what appears to be the proximal duodenum. Electronically Signed   By: Inez Catalina M.D.   On: 02/04/2019 14:20   Assessment / Plan: *Diffuse small bowel and colonic ileus that appears to have been triggered by acute severe diarrheal illness.  Abdominal x-ray yesterday AM actually showed some progression of ileus after NGT clamped overnight so it was placed back to LIS.  He pulled it out O/N last night, however. *Acute diarrhea of unclear cause but has now stopped due to the ileus.  -Keep electrolytes WNL's. K+ should be kept around 4. -Will allow full liquid diet and see how he does.  At this point he's had no nutrition in over a week.  Do not think that he will be a good candidate for TNA as he pulls at his lines, etc.  Otherwise would need goals of care conversation if he does not tolerate full liquid diet.   LOS: 9 days   Laban Emperor. Zehr  02/06/2019, 9:17 AM  I have discussed the case with the PA, and that is the plan I formulated. I personally interviewed and examined the patient.  Abdominal exam is improved today.  It is not clear if he received the suppository and enema as ordered.  If not, please make sure that is done. We have to try him on some nutrition at this point to see how he does.  Would not replace his NG tube at present because he would likely just remove it again, and continued use of mitts for any restraint is likely only to agitate him further.    Nelida Meuse III Office: [phone]

## 2019-02-06 NOTE — Progress Notes (Signed)
Nutrition Follow-up  DOCUMENTATION CODES:   Not applicable  INTERVENTION:   -Ensure Enlive po TID, each supplement provides 350 kcal and 20 grams of protein -MVI with minerals daily  NUTRITION DIAGNOSIS:   Inadequate oral intake related to altered GI function as evidenced by NPO status.  Ongoing  GOAL:   Patient will meet greater than or equal to 90% of their needs  Progressing  MONITOR:   Diet advancement, Labs, Weight trends, Skin, I & O's  REASON FOR ASSESSMENT:   NPO/Clear Liquid Diet    ASSESSMENT:   85 year old man with a history of Parkinson's, dementia, COPD, and CAD who was admitted with diarrhea and abdominal distention.  2/3- NGT placed 2/7- pt pulled out NGT, unable to replace, advanced to full liquids  Reviewed I/O's: -200 ml x 24 hours and +5.7 L since admission  Pt resting quietly at time of visit. No family present to provide additional history.   Case discussed extensively with RN and Sports coach. RN reports that pt pulled NGT out overnight and there have been many unsuccessful attempts to replace it. Plan for repeat KUB (which revealed worsening progress). Per RN, MD has advanced pt to full liquid diet for now. If pt does not tolerate PO's, plan goals of care discussion with family, as pt is a poor TPN candidate (often pulls out lines and tubes).   Labs reviewed.   Diet Order:   Diet Order            Diet full liquid Room service appropriate? Yes; Fluid consistency: Thin  Diet effective now              EDUCATION NEEDS:   Not appropriate for education at this time  Skin:  Skin Assessment: Skin Integrity Issues: Skin Integrity Issues:: Stage II Stage II: sacrum, buttocks  Last BM:  02/04/19  Height:   Ht Readings from Last 1 Encounters:  01/23/19 6' (1.829 m)    Weight:   Wt Readings from Last 1 Encounters:  02/03/19 87 kg    Ideal Body Weight:  80.9 kg  BMI:  Body mass index is 26 kg/m.  Estimated  Nutritional Needs:   Kcal:  1700-1900  Protein:  85-100 grams  Fluid:  1.7-1.9 L    Macalister Arnaud A. Jimmye Norman, RD, LDN, CDE Pager: [phone] After hours Pager: [phone]

## 2019-02-06 NOTE — Progress Notes (Signed)
Patient confused and pulled out his NGT. Notified resident [facility] on call. Attempted twice reinsertion both right and left nare with no success. Small bleeding from right nare. Resident aware attempt unsuccesful. Will closely monitor patient

## 2019-02-06 NOTE — Progress Notes (Signed)
  Date: 02/06/2019  Patient name: Terry Taylor  Medical record number: [id number]  Date of birth: 1928/05/22   I have seen and evaluated this patient and I have discussed the plan of care with the house staff. Please see Dr. Dorothyann Peng note for complete details. I concur with her findings and plan.   Reviewed GI note.  Will allow full liquids today and see how he does.  Needs abdominal exam in the morning, AXR if worsening.  Resident team to discuss course with family this afternoon.   Sid Falcon, MD 02/06/2019, 3:42 PM

## 2019-02-06 NOTE — Progress Notes (Signed)
Occupational Therapy Treatment Patient Details Name: Terry Taylor MRN: [id number] DOB: 12/05/1928 Today's Date: 02/06/2019    History of present illness Patient is a 85 y/o male presenting on 1/29 with abdominal pain and diarrhea. Recent admission and discharge 1/25. PMH signficant for CAD, COPD, and Parkinson's with dementia. Admitted for dehydration secondary to diarrhea.    OT comments  Pt MaxA +2 for bed mobility helicopter style to EOB; pt leaning to R and requires cues to sit upright. Pt sit to stand with bari-stedy maxA +2 to elevate pt to place pads under bottom. Pt sitting upright in recliner for grooming tasks x3 mins requiring multiple verbal cues for proper technique. Pt performing task with increased alertness today and able answer simple questions. Pt would greatly benefit from continued OT skilled services for ADL,mobility and safety. RN aware of pt in chair. OT to follow acutely.     Follow Up Recommendations  SNF;Supervision/Assistance - 24 hour    Equipment Recommendations  None recommended by OT    Recommendations for Other Services      Precautions / Restrictions Precautions Precautions: Fall Restrictions Weight Bearing Restrictions: No       Mobility Bed Mobility Overal bed mobility: Needs Assistance Bed Mobility: Rolling Rolling: Max assist;+2 for physical assistance Sidelying to sit: +2 for physical assistance;Max assist Supine to sit: Max assist;+2 for physical assistance;HOB elevated     General bed mobility comments: helicopter method to pivot toward EOB. Minimal participation from pt to assist with elevating trunk.  Transfers Overall transfer level: Needs assistance Equipment used: Rolling walker (2 wheeled) Transfers: Sit to/from Stand Sit to Stand: +2 physical assistance;Total assist;+2 safety/equipment;From elevated surface Stand pivot transfers: Total assist;+2 physical assistance;+2 safety/equipment;From elevated surface        General transfer comment: requires cues to lean forward to stand    Balance Overall balance assessment: Needs assistance   Sitting balance-Leahy Scale: Poor Sitting balance - Comments: leaning to R     Standing balance-Leahy Scale: Zero                             ADL either performed or assessed with clinical judgement   ADL Overall ADL's : Needs assistance/impaired Eating/Feeding: Minimal assistance                                   Functional mobility during ADLs: Maximal assistance;+2 for physical assistance;+2 for safety/equipment(bari stedy) General ADL Comments: maxA to totalA due to dementia at baseline     Vision   Vision Assessment?: No apparent visual deficits   Perception     Praxis      Cognition Arousal/Alertness: Awake/alert Behavior During Therapy: Flat affect Overall Cognitive Status: History of cognitive impairments - at baseline                                 General Comments: pt oriented to name and birthdate but nothing else        Exercises     Shoulder Instructions       General Comments      Pertinent Vitals/ Pain       Pain Assessment: Faces Faces Pain Scale: Hurts little more Pain Location: abdomen Pain Descriptors / Indicators: Discomfort Pain Intervention(s): Limited activity within patient's tolerance  Home Living  Prior Functioning/Environment              Frequency  Min 2X/week        Progress Toward Goals  OT Goals(current goals can now be found in the care plan section)  Progress towards OT goals: Progressing toward goals  Acute Rehab OT Goals Patient Stated Goal: none stated OT Goal Formulation: With patient/family Time For Goal Achievement: 02/12/19 Potential to Achieve Goals: Fair ADL Goals Pt Will Perform Grooming: with min assist Pt Will Transfer to Toilet: with mod assist;with +2 assist;stand  pivot transfer;bedside commode Additional ADL Goal #1: Pt will be able to maintain standing with Mod A +1 while another person A with clothing and peri care Additional ADL Goal #2: Pt will be able to roll left and right with Mod A to A with his care  Plan Discharge plan remains appropriate    Co-evaluation                 AM-PAC OT "6 Clicks" Daily Activity     Outcome Measure   Help from another person eating meals?: Total Help from another person taking care of personal grooming?: Total Help from another person toileting, which includes using toliet, bedpan, or urinal?: Total Help from another person bathing (including washing, rinsing, drying)?: Total Help from another person to put on and taking off regular upper body clothing?: A Lot Help from another person to put on and taking off regular lower body clothing?: Total 6 Click Score: 7    End of Session Equipment Utilized During Treatment: Gait belt  OT Visit Diagnosis: Other abnormalities of gait and mobility (R26.89);Muscle weakness (generalized) (M62.81);Other symptoms and signs involving cognitive function   Activity Tolerance Patient tolerated treatment well   Patient Left in chair;with call bell/phone within reach;with chair alarm set;with family/visitor present   Nurse Communication Mobility status        Time: 2202-5427 OT Time Calculation (min): 29 min  Charges: OT General Charges $OT Visit: 1 Visit OT Treatments $Therapeutic Activity: 8-22 mins $Neuromuscular Re-education: 8-22 mins  Darryl Nestle) Marsa Aris OTR/L Acute Rehabilitation Services Pager: [phone] Office: [phone]   Fredda Hammed 02/06/2019, 12:49 PM

## 2019-02-06 NOTE — Progress Notes (Signed)
   Subjective: Overnight patient pulled out his NGT and had a large bowel movement after enema. The nurses tried to replace the NGT twice however was unsuccessful. Patient was not having any nausea or vomiting. I discussed with the son yesterday the possibility of starting TPN, they will decide today if this is the direction that we should go.   Today, Terry Taylor is seen sitting up in the chair. He continuously asks the team to take off his mittens, but it is explained to him that they are on for his safety to prevent him from pulling at his IV. He reports that he is hungry. Denies any abdominal pain.   Objective:  Vital signs in last 24 hours: Vitals:   02/05/19 0637 02/05/19 1648 02/05/19 1956 02/06/19 0529  BP: 137/73 (!) 157/76 (!) 164/78 (!) 155/72  Pulse: 65 74 67 66  Resp:  20 20 20   Temp:  99.3 F (37.4 C) 98.2 F (36.8 C) 99 F (37.2 C)  TempSrc:  Axillary Oral Oral  SpO2:  97% 95%   Weight:        General: sitting up comfortably in bed, no distress Cardiac: RRR, no murmurs Pulmonary: CTAB, normal effort on room air Abdomen: Hypoactive bowel sounds, difficult to compare distension to prior exams as patient is sitting up in the chair, non-tender Extremities: mittens on hands  Assessment/Plan:  Principal Problem:   Dehydration Active Problems:   Dementia (HCC)   Parkinson disease (HCC)   Abdominal distension   Gastroenteritis   Ileus (HCC)  This is an 85 year old male with a history of COPD, CAD, Parkinsons, and dementia who presented on 1/30 diarrhea and abdominal distension, found to be hypotensive and had respiratory distress that improved with [place]. CBC, GI and c diff were negative. CT scan showed distended stomach and proximal and mid small bowel with decompressed distal and terminal ileum without transition, colon is fluid distended.An NG tube had tried to be placed however it was unable pass it through and he stopped having n/v so it was not  re-ordered.  Dehydration 2/2 diarrhea Ileus2/2 unknown cause, possibly post-infectious -Patient has been afebrile. He pulled out his NGT last night and had a large bowel movement. The nurses tried replacing the NGT however were unsuccessful. His abdominal x-ray yesterday showed worsening ileus and he had more distension yesterday. He still has hypoactive bowel sounds, had a bowel movement however received a enema yesterday.   -GI following, appreciate recommendations -Continue D5 1/2 NS with K -Continue zofran PRN -PT/OT reccommends SNF however family would prefer him to go home. CSW assisting with HH needs, appreciate assistance. -Replete electrolytes PRN -Up and out of bed -Abd X-ray today -May need to consult IR to replace NGT if x-ray worsened or unable to tolerate CLD  Electrolyte abnormalities: -Phosphorus, mag and K were all WNL today. Will continue to monitor and replete as needed. -Daily renal function and magnesium  Parkinson's, dementia: -Unknown if he will tolerate diet, onquetiapine and carbidop-levodopaat home but holding here -Continue Zyprexa 10 mg IM  - Restart home meds when he is able to tolerate diet  FEN:D5 1/2 NS with K at 100 cc/hr, replete lytes prn, CLD VTE ppx: Lovenox  Code Status: FULL   Dispo: Anticipated discharge is pending clinical improvement   Terry Noble, MD 02/06/2019, 6:31 AM Pager: [phone]

## 2019-02-07 DIAGNOSIS — K56600 Partial intestinal obstruction, unspecified as to cause: Secondary | ICD-10-CM

## 2019-02-07 LAB — RENAL FUNCTION PANEL
Albumin: 2.1 g/dL — ABNORMAL LOW (ref 3.5–5.0)
Anion gap: 6 (ref 5–15)
BUN: 6 mg/dL — ABNORMAL LOW (ref 8–23)
CO2: 26 mmol/L (ref 22–32)
Calcium: 7.7 mg/dL — ABNORMAL LOW (ref 8.9–10.3)
Chloride: 104 mmol/L (ref 98–111)
Creatinine, Ser: 1.04 mg/dL (ref 0.61–1.24)
GFR calc Af Amer: >60 mL/min (ref >60)
GFR calc non Af Amer: >60 mL/min (ref >60)
Glucose, Bld: 95 mg/dL (ref 70–99)
Phosphorus: 3.2 mg/dL (ref 2.5–4.6)
Potassium: 4.3 mmol/L (ref 3.5–5.1)
Sodium: 136 mmol/L (ref 135–145)

## 2019-02-07 NOTE — Progress Notes (Signed)
Paged Resident service of Dr Daryll Drown. Notified that patient had difficulty this am with coughing while trying to eat sherbet and grits from am tray. May need swallow evaluation.  Also IV access was lost and he has been sent order for IV team to evaluate restart.  She states she will notify team and let me know how to proceed

## 2019-02-07 NOTE — Progress Notes (Signed)
Reported BM last evening.  Erythromycin started by primary team. That medicine is of no proven benefit for ileus, and runs risk of recurrent diarrhea, which is what caused this patient's ileus in the first place.  He needs optimal fluid and electrolyte repletion, MOBILIZATION, and diet as tolerated.  If his condition worsens to the point it was a week ago, goals of care should be addressed with the family of this elderly man with advanced dementia.  Signing off - call as needed.

## 2019-02-07 NOTE — Progress Notes (Signed)
Medicine attending: I examined this patient today together with resident physician Dr. Guadlupe Spanish and I concur with his evaluation and management plan. Patient more interactive today.  Exam unchanged.  Abdomen is soft and nontender but distended and tympanitic.  Trial of macrolide antibiotic started to see if this can improve intestinal motility. He was mildly dehydrated on admission secondary to diarrhea.  Renal function has normalized with current BUN 6 and creatinine 1.0. He continues on his Parkinson's medications. He is now able to work with physical and Occupational Therapy. Anticipate discharge back to skilled nursing facility soon.

## 2019-02-07 NOTE — Progress Notes (Signed)
   Subjective: No overnight events. Terry Taylor is seen lying in bed this morning. His mittens have been removed. He reports that he thinks he is doing okay, he is tired this morning.  Objective:  Vital signs in last 24 hours: Vitals:   02/06/19 0529 02/06/19 1317 02/06/19 2206 02/07/19 0448  BP: (!) 155/72 128/70 (!) 140/93 (!) 141/90  Pulse: 66 66 71 66  Resp: 20 16 16 18   Temp: 99 F (37.2 C) 98.6 F (37 C) 99.1 F (37.3 C) 98.8 F (37.1 C)  TempSrc: Oral Oral Oral Oral  SpO2:  95% 94% 95%  Weight:    82.6 kg    General: sitting up comfortably in bed, no distress Cardiac: RRR, no murmurs Pulmonary: coarse upper airway sounds, no crackles or wheezing,  normal effort on room air Abdomen: Abdomen remains distended but soft, bs present Extremities: no LE edema  Assessment/Plan:  Principal Problem:   Dehydration Active Problems:   Dementia (HCC)   Parkinson disease (HCC)   Abdominal distension   Gastroenteritis   Ileus (HCC)    Prolonged Ileus It appears pt had a bowel movement last night, but he remains distended -GI following, appreciate recommendations -Continue D5 1/2 NS with K -PT/OT -K goal >4 -Up and out of bed -will continue prokinetic therapy today if GI agrees  Electrolyte abnormalities: -Daily renal function panel  Parkinson's, dementia: -continue home meds carbidopa levidopa, seroquel  FEN:D5 1/2 NS with K at 100 cc/hr, replete lytes prn, CLD VTE ppx: Lovenox  Code Status: FULL   Dispo: Anticipated discharge is pending clinical improvement   Katherine Roan, MD 02/07/2019, 9:55 AM

## 2019-02-07 NOTE — Evaluation (Signed)
Clinical/Bedside Swallow Evaluation Patient Details  Name: Terry Taylor MRN: [id number] Date of Birth: 05-16-1928  Today's Date: 02/07/2019 Time: SLP Start Time (ACUTE ONLY): 4627 SLP Stop Time (ACUTE ONLY): 1601 SLP Time Calculation (min) (ACUTE ONLY): 12 min  Past Medical History:  Past Medical History:  Diagnosis Date  . Anxiety   . Arthritis    "bad in his knees" (01/09/2017)  . Benign prostatic hypertrophy    hx  . Chronic airway obstruction, not elsewhere classified   . Chronic bronchitis ([place])   . Coronary atherosclerosis of unspecified type of vessel, native or graft   . GERD (gastroesophageal reflux disease)   . New onset atrial fibrillation ([place]) 01/08/2017   Terry Taylor 01/08/2017  . On home oxygen therapy    "prn" (01/09/2017)  . Osteoarthrosis, unspecified whether generalized or localized, unspecified site   . Parkinson's disease ([place])    with dementia and psychosis.   . Pneumonia    "more than once" (01/09/2017)  . Pure hypercholesterolemia    IIA  . Skin cancer of face   . Stroke [facility])    "they think he had a minor stroke 1-2 yr ago" (01/09/2017)  . Unspecified essential hypertension    Past Surgical History:  Past Surgical History:  Procedure Laterality Date  . CERVICAL DISCECTOMY    . FRACTURE SURGERY    . SKIN CANCER DESTRUCTION Right    "face"  . TIBIA FRACTURE SURGERY Right    "he's got a pin in there"   HPI:  Patient is an 85 y/o male presenting on 1/29 with abdominal pain and diarrhea, admitted with dehydration. Recent admission for the same, discharged 1/25. PMH signficant for CAD, COPD, GERD, CVA, PNA, Parkinson's with dementia. Previous BSEs (October 2018, January 2020) have recommended softer diets (up to Dys 3) due to altered mentation but allowing thin liquids.   Assessment / Plan / Recommendation Clinical Impression  Evaluation was limited as pt was not wanting to consume POs. He has a mild R facial droop (daughter says he "has mini strokes")  and anterior spillage of saliva without attempts to correct. He consumed minimal amounts of thin liquids and purees without overt signs of aspiration, but needing Mod cues to faciltiate oral holding. RN described coughing with intake this morning that was improved this afternoon when he was more alert and attentive to meal. Would allow small amounts of current diet (full liquids) when fully alert and accepting, and when full supervision can be provided. Would hold POs if further coughing is observed during intake. Pt is likely at a higher risk for aspiration given acute deconditioning, but will need additional assessment to determine if there is a safer alternative. If he does not see progress from a medical standpoint (still with ileus), additional swallow testing and perhaps diet modifications might not necessarily be indicated. May wish to consider palliative care involvement. SLP Visit Diagnosis: Dysphagia, unspecified (R13.10)    Aspiration Risk  Moderate aspiration risk;Risk for inadequate nutrition/hydration    Diet Recommendation Thin liquid;Other (Comment)(full liquid diet per GI)   Liquid Administration via: Spoon;Cup Medication Administration: Crushed with puree Supervision: Full supervision/cueing for compensatory strategies Compensations: Minimize environmental distractions;Slow rate;Small sips/bites Postural Changes: Seated upright at 90 degrees;Remain upright for at least 30 minutes after po intake    Other  Recommendations Oral Care Recommendations: Oral care BID Other Recommendations: Have oral suction available   Follow up Recommendations Skilled Nursing facility      Frequency and Duration min 2x/week  2  weeks       Prognosis Prognosis for Safe Diet Advancement: Fair Barriers to Reach Goals: Cognitive deficits      Swallow Study   General HPI: Patient is an 85 y/o male presenting on 1/29 with abdominal pain and diarrhea, admitted with dehydration. Recent admission  for the same, discharged 1/25. PMH signficant for CAD, COPD, GERD, CVA, PNA, Parkinson's with dementia. Previous BSEs (October 2018, January 2020) have recommended softer diets (up to Dys 3) due to altered mentation but allowing thin liquids. Type of Study: Bedside Swallow Evaluation Previous Swallow Assessment: see HPI Diet Prior to this Study: Thin liquids;Other (Comment)(full liquid diet) Temperature Spikes Noted: No Respiratory Status: Room air History of Recent Intubation: No Behavior/Cognition: Alert;Requires cueing Oral Cavity Assessment: Excessive secretions Oral Care Completed by SLP: No Oral Cavity - Dentition: (needs more assessment) Self-Feeding Abilities: Total assist Patient Positioning: Upright in bed(mild lean to R) Baseline Vocal Quality: Normal;Other (comment)(speech dysarthric) Volitional Cough: Weak Volitional Swallow: Unable to elicit    Oral/Motor/Sensory Function Overall Oral Motor/Sensory Function: (difficulty following commands but R facial droop noted)   Ice Chips Ice chips: Not tested   Thin Liquid Thin Liquid: Impaired Presentation: Spoon;Cup Oral Phase Impairments: Poor awareness of bolus    Nectar Thick Nectar Thick Liquid: Not tested   Honey Thick Honey Thick Liquid: Not tested   Puree Puree: Impaired Presentation: Spoon Oral Phase Impairments: Poor awareness of bolus Oral Phase Functional Implications: Oral holding   Solid     Solid: Not tested      [name] 02/07/2019,4:17 PM  Nuala Alpha, M.A. [name] Acute Environmental education officer [phone] Office [phone]

## 2019-02-08 ENCOUNTER — Inpatient Hospital Stay (HOSPITAL_COMMUNITY): Payer: Medicare HMO

## 2019-02-08 LAB — RENAL FUNCTION PANEL
Albumin: 2 g/dL — ABNORMAL LOW (ref 3.5–5.0)
Anion gap: 8 (ref 5–15)
BUN: 6 mg/dL — ABNORMAL LOW (ref 8–23)
CO2: 24 mmol/L (ref 22–32)
Calcium: 7.9 mg/dL — ABNORMAL LOW (ref 8.9–10.3)
Chloride: 104 mmol/L (ref 98–111)
Creatinine, Ser: 1.09 mg/dL (ref 0.61–1.24)
GFR calc Af Amer: >60 mL/min (ref >60)
GFR calc non Af Amer: >60 mL/min (ref >60)
Glucose, Bld: 105 mg/dL — ABNORMAL HIGH (ref 70–99)
PHOSPHORUS: 3.4 mg/dL (ref 2.5–4.6)
Potassium: 4.4 mmol/L (ref 3.5–5.1)
Sodium: 136 mmol/L (ref 135–145)

## 2019-02-08 NOTE — Progress Notes (Signed)
   Subjective: Terry Taylor was resting comfortably in bed. No family at bedside. No acute events overnight, he did not have a bowel movement yesterday. He denied any abdominal pain, nausea, vomiting, or passing flatulence. The nurse reported that he ate some ice cream last night with no issues.   Objective:  Vital signs in last 24 hours: Vitals:   02/07/19 0448 02/07/19 1434 02/07/19 2024 02/08/19 0524  BP: (!) 141/90 130/76 129/70 (!) 114/53  Pulse: 66 62 64 65  Resp: 18 18 17 16   Temp: 98.8 F (37.1 C) 98.9 F (37.2 C) 98.2 F (36.8 C) 98.2 F (36.8 C)  TempSrc: Oral Oral Oral Oral  SpO2: 95% 96% 93% 95%  Weight: 82.6 kg       General: Frail appearing, NAD Cardiac: RRR, no m/r/g Pulmonary: CTA BL, no wheezing or rhonchi Abdomen: Soft, minimal distension (improving), normoactive BS   Assessment/Plan:  Principal Problem:   Dehydration Active Problems:   Dementia (HCC)   Parkinson disease (HCC)   Abdominal distension   Gastroenteritis   Ileus (HCC)   Partial small bowel obstruction (HCC)  This is an 85 year old male with a history of COPD, CAD, Parkinsons, and dementia who presented on 1/30 diarrhea and abdominal distension, found to be hypotensive and had respiratory distress that improved with [place]. CBC, GI and c diff were negative. CT scan showed distended stomach and proximal and mid small bowel with decompressed distal and terminal ileum without transition, colon is fluid distended.An NG tube had tried to be placed however it was unable pass it through and he stopped having n/v so it was not re-ordered.  Dehydration 2/2 diarrhea Prolonged Ileus -Dehydration has improved, electrolytes WNL. He did not have a bowel movement last night, he has not been having much return of his bowel function. On exam today his abdomen is soft, distension has improved, and he has more bowel sounds. His abdominal x-ray shows there is still ileus. He will likely need a replacement of the  NGT via IR and TPN if this is the route the family would like to go.  -PT/OT recommends SNF, family would like patient to return home. Consult case management for possible HH needs.  -Up and out of bed -Replete electrolytes PRN -Continue D5 1/2 NS -Continue CLD  Electrolyte abnormalities: -Phosphorus, mag and K were all WNLtoday. Will continue to monitor and replete as needed. -Daily renal function and magnesium  Parkinson's, dementia: -Patient has been started on full liquid diet, continue home quetiapine and carbidopa-levidopa  FEN: D5 1/2 NS cc/hr, replete lytes prn, Clear liquid VTE ppx: Lovenox  Code Status: FULL    Dispo: Anticipated discharge is pending clinical improvement   [name], MD 02/08/2019, 6:53 AM Pager: [phone]

## 2019-02-08 NOTE — Progress Notes (Signed)
  Speech Language Pathology Treatment: Dysphagia  Patient Details Name: Terry Taylor MRN: [id number] DOB: [birth year] Today's Date: 02/08/2019 Time: 1735-6701 SLP Time Calculation (min) (ACUTE ONLY): 13 min  Assessment / Plan / Recommendation Clinical Impression  Pt was seen with breakfast to assess tolerance of the current diet. Nursing reported that the pt has demonstrated a wet vocal quality at baseline and pt's family may have decided on palliative care. Pt tolerate grits, pudding, ice cream, and thin liquids via straw without coughing. However, coughing was noted with thin liquids via cup when pt self-fed and mildly wet vocal quality was intermittently demonstrated. Considering his inconsistent symptoms combined with his history of dementia and Parkinson's disease, an instrumental assessment would be beneficial to further assess swallow function but it will be deferred pending family's definitive decision regarding goals of care. SLP will continue to follow.    HPI HPI: Patient is an 85 y/o male presenting on 1/29 with abdominal pain and diarrhea, admitted with dehydration. Recent admission for the same, discharged 1/25. PMH signficant for CAD, COPD, GERD, CVA, PNA, Parkinson's with dementia. Previous BSEs (October 2018, January 2020) have recommended softer diets (up to Dys 3) due to altered mentation but allowing thin liquids.      SLP Plan  Continue with current plan of care       Recommendations  Diet recommendations: Thin liquid Liquids provided via: Straw Medication Administration: Crushed with puree Supervision: Full supervision/cueing for compensatory strategies Compensations: Minimize environmental distractions;Slow rate;Small sips/bites Postural Changes and/or Swallow Maneuvers: Seated upright 90 degrees;Upright 30-60 min after meal                Oral Care Recommendations: Oral care BID Follow up Recommendations: Skilled Nursing facility SLP Visit Diagnosis:  Dysphagia, unspecified (R13.10) Plan: Continue with current plan of care       Riese Hellard I. Hardin Negus, [place], [place] Office number [phone] Pager [phone]                Horton Marshall 02/08/2019, 9:43 AM

## 2019-02-08 NOTE — Progress Notes (Signed)
Medicine attending:   Clinical status and medical database reviewed with resident physician Dr Lonia Skinner and I concur with her evaluation and management plan. No acute change.  Persistent ileus on follow-up abdominal x-rays.  GI recommends stopping erythromycin. Bedside swallow evaluation done yesterday.  Overall poor cooperation with exam but he did not appear to choke on thin liquids.  Nutritional status will become a limiting factor in his further recovery.  Ongoing need to address goals of care with the family.  I do not think a feeding tube will prolong the quantity or quality of his life.

## 2019-02-09 DIAGNOSIS — F0281 Dementia in other diseases classified elsewhere with behavioral disturbance: Secondary | ICD-10-CM

## 2019-02-09 DIAGNOSIS — Z515 Encounter for palliative care: Secondary | ICD-10-CM

## 2019-02-09 DIAGNOSIS — R451 Restlessness and agitation: Secondary | ICD-10-CM

## 2019-02-09 DIAGNOSIS — G20 Parkinson's disease: Secondary | ICD-10-CM

## 2019-02-09 DIAGNOSIS — K117 Disturbances of salivary secretion: Secondary | ICD-10-CM

## 2019-02-09 LAB — OVA + PARASITE EXAM

## 2019-02-09 LAB — RENAL FUNCTION PANEL
Albumin: 2.4 g/dL — ABNORMAL LOW (ref 3.5–5.0)
Anion gap: 9 (ref 5–15)
BUN: 6 mg/dL — ABNORMAL LOW (ref 8–23)
CO2: 26 mmol/L (ref 22–32)
Calcium: 8.2 mg/dL — ABNORMAL LOW (ref 8.9–10.3)
Chloride: 101 mmol/L (ref 98–111)
Creatinine, Ser: 1.28 mg/dL — ABNORMAL HIGH (ref 0.61–1.24)
GFR calc Af Amer: 58 mL/min — ABNORMAL LOW (ref >60)
GFR calc non Af Amer: 50 mL/min — ABNORMAL LOW (ref >60)
Glucose, Bld: 113 mg/dL — ABNORMAL HIGH (ref 70–99)
Phosphorus: 3.6 mg/dL (ref 2.5–4.6)
Potassium: 4.1 mmol/L (ref 3.5–5.1)
Sodium: 136 mmol/L (ref 135–145)

## 2019-02-09 LAB — O&P RESULT

## 2019-02-09 MED ORDER — MORPHINE SULFATE (CONCENTRATE) 10 MG/0.5ML PO SOLN
5.0000 mg | ORAL | Status: DC | PRN
  Administered 2019-02-09 – 2019-02-10 (×2): 5 mg via ORAL
  Filled 2019-02-09 (×3): qty 0.5

## 2019-02-09 MED ORDER — SCOPOLAMINE 1 MG/3DAYS TD PT72
1.0000 | MEDICATED_PATCH | TRANSDERMAL | Status: DC
  Administered 2019-02-09: 1.5 mg via TRANSDERMAL
  Filled 2019-02-09: qty 1

## 2019-02-09 MED ORDER — QUETIAPINE FUMARATE 50 MG PO TABS
25.0000 mg | ORAL_TABLET | Freq: Two times a day (BID) | ORAL | Status: DC
  Administered 2019-02-09 – 2019-02-10 (×3): 25 mg via ORAL
  Filled 2019-02-09 (×3): qty 1

## 2019-02-09 NOTE — Progress Notes (Signed)
Physical Therapy Treatment Patient Details Name: Terry Taylor MRN: [id number] DOB: [birth year] Today's Date: 02/09/2019    History of Present Illness Patient is a 85 y/o male presenting on 1/29 with abdominal pain and diarrhea. Recent admission and discharge 1/25. PMH signficant for CAD, COPD, and Parkinson's with dementia. Admitted for dehydration secondary to diarrhea.     PT Comments    Pt is making slow progress towards his goals today. Pt is limited in safe mobility by decreased cognition and decreased strength and endurance. Pt requires maxAx2 for bed mobility and sit>stand transfers in [place] where he was pivoted to recliner. Pt with increased weeping of L UE and decreased ability to hold his head up today. D/c plans remain appropriate at this time. PT will continue to see pt acutely.    Follow Up Recommendations  SNF;Supervision/Assistance - 24 hour     Equipment Recommendations  None recommended by PT    Recommendations for Other Services       Precautions / Restrictions Precautions Precautions: Fall;Other (comment)    Mobility  Bed Mobility Overal bed mobility: Needs Assistance       Supine to sit: Max assist;+2 for physical assistance;HOB elevated     General bed mobility comments: helicopter method to pivot toward EOB. Minimal participation from pt to assist with elevating trunk.  Transfers Overall transfer level: Needs assistance   Transfers: Sit to/from Stand Sit to Stand: Max assist;+2 physical assistance Stand pivot transfers: Total assist;+2 physical assistance       General transfer comment: Use of bed pad to lift hips from bed to stand in stedy. Total/dependent transfer bed to recliner using stedy. Pt with inability to hold head and torso up during transfers to recliner on St[place]  Ambulation/Gait             General Gait Details: unable         Balance Overall balance assessment: Needs assistance Sitting-balance support: Bilateral  upper extremity supported;Feet supported Sitting balance-Leahy Scale: Poor Sitting balance - Comments: max assist to maintain sitting balance EOB Postural control: Posterior lean;Right lateral lean Standing balance support: Bilateral upper extremity supported;During functional activity Standing balance-Leahy Scale: Zero Standing balance comment: stedy for transfer                            Cognition Arousal/Alertness: Awake/alert Behavior During Therapy: Flat affect Overall Cognitive Status: History of cognitive impairments - at baseline                                           General Comments General comments (skin integrity, edema, etc.): Pt L UE banadaged in gauze with increased serosanguinous drainage. Placed arm on absorbant pad once in sitting in chair.       Pertinent Vitals/Pain Pain Assessment: Faces Faces Pain Scale: Hurts a little bit Pain Location: abdomen Pain Descriptors / Indicators: Discomfort Pain Intervention(s): Limited activity within patient's tolerance;Monitored during session;Repositioned           PT Goals (current goals can now be found in the care plan section) Acute Rehab PT Goals Patient Stated Goal: none stated PT Goal Formulation: Patient unable to participate in goal setting Time For Goal Achievement: 02/12/19 Potential to Achieve Goals: Fair    Frequency    Min 2X/week      PT Plan Current plan remains  appropriate       AM-PAC PT "6 Clicks" Mobility   Outcome Measure  Help needed turning from your back to your side while in a flat bed without using bedrails?: Total Help needed moving from lying on your back to sitting on the side of a flat bed without using bedrails?: Total Help needed moving to and from a bed to a chair (including a wheelchair)?: Total Help needed standing up from a chair using your arms (e.g., wheelchair or bedside chair)?: A Lot Help needed to walk in hospital room?: Total Help  needed climbing 3-5 steps with a railing? : Total 6 Click Score: 7    End of Session Equipment Utilized During Treatment: Gait belt Activity Tolerance: Patient tolerated treatment well Patient left: in chair;with call bell/phone within reach;with chair alarm set;with family/visitor present;with restraints reapplied Nurse Communication: Mobility status PT Visit Diagnosis: Unsteadiness on feet (R26.81);Other abnormalities of gait and mobility (R26.89);Muscle weakness (generalized) (M62.81)     Time: 4496-7591 PT Time Calculation (min) (ACUTE ONLY): 19 min  Charges:  $Therapeutic Activity: 8-22 mins                     Fahim Kats B. Migdalia Dk PT, DPT Acute Rehabilitation Services Pager [phone] Office [phone]    [place] 02/09/2019, 2:05 PM

## 2019-02-09 NOTE — Care Management Note (Addendum)
Case Management Note  Patient Details  Name: Terry Taylor MRN: [id number] Date of Birth: 1928/04/06  Subjective/Objective:                    Action/Plan:  Spoke with son Terry Taylor, daughter Terry Taylor and daughter in Insurance risk surveyor.   Plan to discharge to home with hospice, family requesting Hospice and [place]. Referral called to [facility] at [facility].   Patient's address is [address], [place], [place] [address], son is contact person [phone].   Patient has 24 hour caregivers at home, hospital bed. All DME is through [facility], except he has a oxygen tank through [place] , family states oxygen tank is empty and they do not have a concentrator. Patient not currently using oxygen.   Family requesting gel mattress , condom catheters, and suction for home. [facility] will order all DME. Terry Taylor aware of requests.   Discharge planned for tomorrow 02/10/19 so DME can be arranged.   Patient will go home by [facility]AR.  [facility] will call son.   Terry Taylor with [facility] aware of plan home with hospice. Expected Discharge Date:                  Expected Discharge Plan:  Home w Hospice Care  In-House Referral:  Hospice / Palliative Care  Discharge planning Services  CM Consult  Post Acute Care Choice:    Choice offered to:  Patient, Adult Children  DME Arranged:    DME Agency:     HH Arranged:  NA HH Agency:  [facility]  Status of Service:  In process, will continue to follow  If discussed at Long Length of Stay Meetings, dates discussed:    Additional Comments:  Terry Favre, RN 02/09/2019, 4:28 PM

## 2019-02-09 NOTE — Progress Notes (Signed)
Nutrition Follow-up  DOCUMENTATION CODES:   Not applicable  INTERVENTION:   -D/c Ensure Enlive po TID, each supplement provides 350 kcal and 20 grams of protein -Continue MVI with minerals daily -Magic Cup TID with meals, each supplement provides 290 kcals and 9 grams protein -Hormel Shake TID with meals, each supplement provides 520 kcals and 22 grams protein -Pudding on all meal trays  NUTRITION DIAGNOSIS:   Inadequate oral intake related to altered GI function as evidenced by NPO status.  Progressing; advanced to full liquids, however, minimal PO's  GOAL:   Patient will meet greater than or equal to 90% of their needs  Progressing  MONITOR:   Diet advancement, Labs, Weight trends, Skin, I & O's  REASON FOR ASSESSMENT:   NPO/Clear Liquid Diet    ASSESSMENT:   85 year old man with a history of Parkinson's, dementia, COPD, and CAD who was admitted with diarrhea and abdominal distention.  2/3- NGT placed 2/7- pt pulled out NGT, unable to replace, advanced to full liquids  Reviewed I/O's: -1.7 L x 24 hours and +3.5 L since admission  SLP following; pt would benefit from instrumental testing, however, pt has goals of care meeting with palliative care team this afternoon. Plan to likely shift focus to comfort based case.   Pt remains on full liquids, however, refusing most PO's and complains of swallowing difficulties. He is refusing Ensure supplements. RD will trial Hormel Shakes and Magic Cups in order to maximize intake.   Per MD notes, pt remains a good nutrition support (TPN and TF) candidate, due to pt's tendency to pull out lines and IVs.   Labs reviewed.   Diet Order:   Diet Order            Diet full liquid Room service appropriate? Yes; Fluid consistency: Thin  Diet effective now              EDUCATION NEEDS:   Not appropriate for education at this time  Skin:  Skin Assessment: Skin Integrity Issues: Skin Integrity Issues:: Stage II Stage II:  sacrum, buttocks  Last BM:  02/04/19  Height:   Ht Readings from Last 1 Encounters:  01/23/19 6' (1.829 m)    Weight:   Wt Readings from Last 1 Encounters:  02/09/19 83.2 kg    Ideal Body Weight:  80.9 kg  BMI:  Body mass index is 24.88 kg/m.  Estimated Nutritional Needs:   Kcal:  1700-1900  Protein:  85-100 grams  Fluid:  1.7-1.9 L    Kymari Nuon A. Jimmye Norman, RD, LDN, CDE Pager: [phone] After hours Pager: [phone]

## 2019-02-09 NOTE — Consult Note (Signed)
Consultation Note Date: 02/09/2019   Patient Name: Terry Taylor  DOB: [birth year]  MRN: [id number]  Age / Sex: 85 y.o., male  PCP: Alvester Chou, NP Referring Physician: Sid Falcon, MD  Reason for Consultation: Establishing goals of care  HPI/Patient Profile: 85 y.o. male  with past medical history of Parkinson's/dementia, CVA, afib, GERD, BPH, arthritis, and anxiety admitted on 01/28/2019 with diarrhea and abdominal distention. GI and cdiff negative. CT scan revealed distended stomach and proximal and mid small bowel with decompressed distal and terminal ileum without transition. Colon is fluid distended. Despite appropriate interventions, patient has shown no improvement of ileus and with poor nutritional status. Palliative medicine consultation for goals of care.   Clinical Assessment and Goals of Care:  I have reviewed medical records, discussed with care team, and met with patient's children Braulio Conte and [place]) and other family members including grandchildren, DIL, and caregiver in conference room to discuss diagnosis prognosis, GOC, EOL wishes, disposition and options.  I introduced Palliative Medicine as specialized medical care for people living with serious illness. It focuses on providing relief from the symptoms and stress of a serious illness.   We discussed a brief life review of the patient. 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 Family describes him as "wide open" and "strong-willed."   Family shares that his ex-wife (at that time wife) was having him managed for Parkinson's/dementia and he was receiving four different anti-psychotic medications that lead to permanent brain damage. "She was trying to kill him." Vaun and [name] discovered this in October 2018 and since have taken over his care and filed a restraining order on the  wife/their mother.   [place] and [place] ensure that their father has 24/7 caregivers and remains in his [phone] home that has been in the family for generations.  Discussed events leading up to admission and course of hospitalization including diagnoses and interventions. Family has a good understanding of poor prognosis with prolonged ileus and poor nutritional status.   I attempted to elicit values and goals of care important to the patient and family. Advanced directives, concepts specific to code status, artifical feeding and hydration, and rehospitalization were considered and discussed. Family appropriately tearful and speak of wanting "peace" and "comfort" for him. They do not wish to prolong his suffering or see him in pain. Understanding prognosis, [name] and Kayce agree that heroic interventions at EOL (including resuscitation/life support/feeding tube) would cause more pain and suffering. Payton Doughty request DO NOT RESUSCITATE code status.   [name] is eager to get her father home tonight. Their father has past spoken of wanting to "die at home." Explained that we need to arrange a safe plan to discharge home with support of hospice services. Introduced hospice philosophy and options. Family aware and appreciative of past experiences with hospice services. Family wish to take their father home with support of hospice services.   Discussed focus on comfort, quality, and dignity at EOL. Educated on EOL expectations.  Discussed comfort feeds and medications for symptom management. Patient has audible secretions--children agreeable to start Scopolamine patch for secretions. He has not required frequent pain medication. Seroquel helps with agitation. Family understands interventions and medications not aimed at comfort will be discontinued when discharged home with hospice (including IVF).   Again family confirms their wish for comfort and peace for their father. This morning, he was talking to  deceased family members. Yolanda Bonine shares that his grandfather has been saved and is at peace with EOL.   Answered all questions and concerns. Discussed plan for discharge tomorrow afternoon if hospice services can be arranged. Family agreeable. Therapeutic listening and emotional/spiritual support provided. Family shares pictures of Mr. Leung. Hard Choices booklet left.    SUMMARY OF RECOMMENDATIONS    Palliative GOC with patient's daughter, son, and multiple family members. Understanding diagnoses and poor prognosis, family wishes to focus on "peace" and "comfort." They wish to take their father home with support of hospice services and 24/7 caregivers. RN CM notified.   Symptom management--see below  Children request DNR code status. Durable DNR completed.   Continue comfort feeds per patient/family request.  Family hopeful to get him home by tomorrow afternoon. Updated attending.   Code Status/Advance Care Planning:  DNR  Symptom Management:   Roxanol 55m SL q4h prn pain/dyspnea  Scopolamine TD for secretions  Seroquel 237mPO BID  Phenergan IV prn  Palliative Prophylaxis:   Aspiration, Delirium Protocol, Frequent Pain Assessment, Oral Care and Turn Reposition  Additional Recommendations (Limitations, Scope, Preferences):  DNR/DNI, NO tube feedings. Shift to comfort  Psycho-social/Spiritual:   Desire for further Chaplaincy support:yes  Additional Recommendations: Caregiving  Support/Resources, Compassionate Wean Education and Education on Hospice  Prognosis:   Poor prognosis with prolonged ileus, declining functional/cognitive/nutritional status with very poor PO intake, and underlying Parkinson's/dementia  Discharge Planning: Home with Hospice      Primary Diagnoses: Present on Admission: . Dementia ([facility]. Parkinson disease ([facility]. Gastroenteritis . Dehydration   I have reviewed the medical record, interviewed the patient and family, and examined  the patient. The following aspects are pertinent.  Past Medical History:  Diagnosis Date  . Anxiety   . Arthritis    "bad in his knees" (01/09/2017)  . Benign prostatic hypertrophy    hx  . Chronic airway obstruction, not elsewhere classified   . Chronic bronchitis ([facility]  . Coronary atherosclerosis of unspecified type of vessel, native or graft   . GERD (gastroesophageal reflux disease)   . New onset atrial fibrillation ([facility]CC[facility]01/08/2017   /nArchie Endo/08/2017  . On home oxygen therapy    "prn" (01/09/2017)  . Osteoarthrosis, unspecified whether generalized or localized, unspecified site   . Parkinson's disease ([place]   with dementia and psychosis.   . Pneumonia    "more than once" (01/09/2017)  . Pure hypercholesterolemia    IIA  . Skin cancer of face   . [facility]   "they think he had a minor stroke 1-2 yr ago" (01/09/2017)  . Unspecified essential hypertension    Social History   Socioeconomic History  . Marital status: Married    Spouse name: Not on file  . Number of children: 2  . Years of education: Not on file  . Highest education level: Not on file  Occupational History  . Occupation: retired  [employer] laboratory technician. Financial resource strain: Not on file  . Food insecurity:    Worry: Not on file  Inability: Not on file  . Transportation needs:    Medical: Not on file    Non-medical: Not on file  Tobacco Use  . Smoking status: Former Smoker    Years: 40.00    Types: Cigarettes    Last attempt to quit: 1979    Years since quitting: 41.1  . Smokeless tobacco: Never Used  Substance and Sexual Activity  . Alcohol use: Not Currently  . Drug use: No  . Sexual activity: Never  Lifestyle  . Physical activity:    Days per week: Patient refused    Minutes per session: Patient refused  . Stress: Not on file  Relationships  . Social connections:    Talks on phone: Patient refused    Gets together: Patient refused    Attends religious service: Patient refused     Active member of club or organization: Patient refused    Attends meetings of clubs or organizations: Patient refused    Relationship status: Patient refused  Other Topics Concern  . Not on file  Social History Narrative   Retired, married.    Family History  Problem Relation Age of Onset  . Stroke Mother   . Stroke Father   . Diabetes Brother   . Other Daughter        adopted   Scheduled Meds: . carbidopa-levodopa  1 tablet Oral BID  . mouth rinse  15 mL Mouth Rinse BID  . QUEtiapine  25 mg Oral BID  . scopolamine  1 patch Transdermal Q72H  . sodium chloride flush  3 mL Intravenous Q12H  . tamsulosin  0.4 mg Oral Daily   Continuous Infusions: . dextrose 5 % and 0.45 % NaCl with KCl 20 mEq/L 60 mL/hr at 02/09/19 1400   PRN Meds:.acetaminophen **OR** acetaminophen, benzonatate, morphine CONCENTRATE, naphazoline-glycerin, phenol, promethazine Medications Prior to Admission:  Prior to Admission medications   Medication Sig Start Date End Date Taking? Authorizing Provider  acetaminophen (TYLENOL) 500 MG tablet Take 500 mg by mouth at bedtime.   Yes [provider]  carbidopa-levodopa (SINEMET IR) 25-100 MG tablet Take 1 tablet by mouth 2 (two) times daily.   Yes [provider]  CRANBERRY PO Take 1 tablet by mouth daily.   Yes [provider]  isosorbide mononitrate (IMDUR) 30 MG 24 hr tablet Take 0.5 tablets (15 mg total) by mouth daily. 09/13/16  Yes Ghimire, Henreitta Leber, MD  loperamide (IMODIUM A-D) 2 MG tablet Take 2 mg by mouth 4 (four) times daily as needed for diarrhea or loose stools.   Yes [provider]  Melatonin 3 MG TABS Take 3-6 mg by mouth at bedtime.   Yes [provider]  naphazoline-glycerin (CLEAR EYES) 0.012-0.2 % SOLN Place 1-2 drops into both eyes 4 (four) times daily as needed for eye irritation. Patient taking differently: Place 1-2 drops into both eyes See admin instructions. Instill 1-2 drops into both eyes at  bedtime for redness, and as needed daily for irritation 10/09/17  Yes Rai, Ripudeep K, MD  NON FORMULARY Take 1 capsule by mouth See admin instructions. OnGuard Immune Defense softgel capsules: Take 1 capsule by mouth once a day   Yes [provider]  NON FORMULARY Take 1 packet by mouth See admin instructions. Melaleuca AM and PM Longevity vitamin packets: Take 1 packet by mouth in the morning and 1 packet in the evening   Yes [provider]  QUEtiapine (SEROQUEL) 25 MG tablet Take 25 mg by mouth See admin  instructions. Take 25 mg by mouth at bedtime and an additional 25 mg in the morning if hallucinating 12/26/18  Yes [provider]  tamsulosin (FLOMAX) 0.4 MG CAPS capsule Take 0.4 mg by mouth daily.   Yes [provider]  feeding supplement, ENSURE ENLIVE, (ENSURE ENLIVE) LIQD Take 237 mLs by mouth 2 (two) times daily between meals. Patient not taking: Reported on 01/23/2019 10/12/17   Mendel Corning, MD   Allergies  Allergen Reactions  . Bee Venom Anaphylaxis  . Adhesive [Tape] Other (See Comments)    TEARS THE SKIN   Review of Systems  Unable to perform ROS: Dementia   Physical Exam Vitals signs and nursing note reviewed.  Constitutional:      General: He is awake.     Appearance: He is ill-appearing.  HENT:     Head: Normocephalic and atraumatic.  Pulmonary:     Effort: No tachypnea, accessory muscle usage or respiratory distress.  Skin:    General: Skin is warm and dry.     Coloration: Skin is pale.  Neurological:     Mental Status: He is alert.     Comments: Awake, alert, confused with baseline dementia  Psychiatric:        Attention and Perception: He is inattentive.        Speech: Speech is delayed.        Cognition and Memory: Cognition is impaired.     Vital Signs: BP (!) 95/57 (BP Location: Right Wrist)   Pulse 72   Temp 98.3 F (36.8 C) (Oral)   Resp 20   Wt 83.2 kg   SpO2 95%   BMI 24.88 kg/m  Pain Scale: 0-10     Pain Score: Asleep   SpO2: SpO2: 95 % O2 Device:SpO2: 95 % O2 Flow Rate: .O2 Flow Rate (L/min): 2 L/min  IO: Intake/output summary:   Intake/Output Summary (Last 24 hours) at 02/09/2019 1701 Last data filed at 02/09/2019 1400 Gross per 24 hour  Intake 600 ml  Output 1450 ml  Net -850 ml    LBM: Last BM Date: 02/04/19 Baseline Weight: Weight: 83.6 kg Most recent weight: Weight: 83.2 kg     Palliative Assessment/Data: PPS 50%   Flowsheet Rows     Most Recent Value  Intake Tab  Referral Department  Hospitalist  Unit at Time of Referral  Med/Surg Unit  Palliative Care Primary Diagnosis  Sepsis/Infectious Disease  Palliative Care Type  New Palliative care  Reason for referral  Clarify Goals of Care, End of Life Care Assistance  Date first seen by Palliative Care  02/09/19  Clinical Assessment  Palliative Performance Scale Score  50%  Psychosocial & Spiritual Assessment  Palliative Care Outcomes  Patient/Family meeting held?  Yes  Who was at the meeting?  daughter, son, multiple family members  Palliative Care Outcomes  Clarified goals of care, Provided end of life care assistance, ACP counseling assistance, Improved pain interventions, Improved non-pain symptom therapy, Counseled regarding hospice, Changed to focus on comfort, Completed durable DNR, Transitioned to hospice, Changed CPR status, Provided psychosocial or spiritual support      Time In: 1500 Time Out: 1620 Time Total: 80 Greater than 50%  of this time was spent counseling and coordinating care related to the above assessment and plan.  Signed by:  Ihor Dow, FNP-C Palliative Medicine Team  Phone: [phone] Fax: [phone]   Please contact Palliative Medicine Team phone at [phone] for questions and concerns.  For individual provider: See Shea Evans

## 2019-02-09 NOTE — Progress Notes (Signed)
[place] meeting scheduled with son and daughter this afternoon, 02/09/19 at 3pm. Thank you.  NO CHARGE  Ihor Dow, FNP-C Palliative Medicine Team  Phone: [phone] Fax: [phone]

## 2019-02-09 NOTE — Progress Notes (Signed)
   Subjective: No overnight events. Mr. Gulley reports that he's tired today. His speech continues to be difficult to understand. He has no questions for the medical team today. No bowel movements overnight. Family at bedside reports that palliative will talk with his son today.   Objective:  Vital signs in last 24 hours: Vitals:   02/08/19 0524 02/08/19 1529 02/08/19 2148 02/09/19 0540  BP: (!) 114/53 128/72 (!) 152/73 135/82  Pulse: 65 71 69 70  Resp: 16  20 20   Temp: 98.2 F (36.8 C)  99.1 F (37.3 C) 98.4 F (36.9 C)  TempSrc: Oral  Oral Oral  SpO2: 95% 97% 95% 97%  Weight:        General: Lying comfortably in bed, no distress Cardiac: RRR, no murmurs Abdomen: Distended, non-tender, +BS  Assessment/Plan:  Principal Problem:   Dehydration Active Problems:   Dementia (HCC)   Parkinson disease (HCC)   Abdominal distension   Gastroenteritis   Ileus (HCC)   Partial small bowel obstruction (HCC)  This is an 85 year old male with a history of COPD, CAD, Parkinsons, and dementia who presented on 1/30 diarrhea and abdominal distension, found to be hypotensive and had respiratory distress that improved with [place]. CBC, GI and c diff were negative. CT scan showed distended stomach and proximal and mid small bowel with decompressed distal and terminal ileum without transition, colon is fluid distended.  Dehydration 2/2 diarrhea Prolonged Ileus -Patient continues to not have any bowel movements. He has had almost no improvement in his ileus since he has been here, he has been treated with the NGT, correction of his electrolytes, and mobilization. He has not had adequate nutrition and he is not a good candidate for TPN. Discussed with the family and they would like for him to go home and to be more comfort care. Placed palliative care consult and they will likely need to discuss about hospice options.  -Up and out of bed -Replete electrolytes PRN -Continue D5 1/2 NS -Continue  CLD  Electrolyte abnormalities: -Will continue to monitor and replete as needed. -Daily renal function and magnesium  Parkinson's, dementia: -Patient has been started on full liquid diet, continue home quetiapine and carbidopa-levidopa  FEN: D5 1/2 NS cc/hr, replete lytes prn, Clear liquid VTE ppx: Lovenox  Code Status: FULL    Dispo: Anticipated discharge in approximately 0-1 day pending home hospice needs.   [name], MD 02/09/2019, 6:58 AM Pager: [phone]

## 2019-02-09 NOTE — Progress Notes (Signed)
  Date: 02/09/2019  Patient name: Terry Taylor  Medical record number: [id number]  Date of birth: 04-27-1928   I have seen and evaluated this patient and I have discussed the plan of care with the house staff. Please see Dr. Dorothyann Peng note for complete details. I concur with her findings and plan.   Based on review of notes, [place] meeting with [facility] team will occur today.  Further plan for disposition will be based on this conversation.   Sid Falcon, MD 02/09/2019, 2:49 PM

## 2019-02-09 NOTE — Progress Notes (Signed)
Pt has a weeping skin tear on the left arm which drips.  Pouched it to contain the serous fluid.  Showed family what I was doing.

## 2019-02-09 NOTE — Progress Notes (Signed)
  Speech Language Pathology Treatment: Dysphagia  Patient Details Name: Terry Taylor MRN: [id number] DOB: 1928-09-28 Today's Date: 02/09/2019 Time: 5053-9767 SLP Time Calculation (min) (ACUTE ONLY): 9 min  Assessment / Plan / Recommendation Clinical Impression  Pt's caregiver at bedside who relayed plan which is home with hospice (pt does not know yet). Discussed prior swallow in which pt would cough with liquids some and yesterday with ice cream. Oral inspection revealed moist and pink mucosa (from limited view) and educated caregiver importance of continuing oral care at discharge as well as general swallow precautions. Pt declined liquid or ice cream with SLP. Will sign off at this time but please notify if ST is needed.    HPI HPI: Patient is an 85 y/o male presenting on 1/29 with abdominal pain and diarrhea, admitted with dehydration. Recent admission for the same, discharged 1/25. PMH signficant for CAD, COPD, GERD, CVA, PNA, Parkinson's with dementia. Previous BSEs (October 2018, January 2020) have recommended softer diets (up to Dys 3) due to altered mentation but allowing thin liquids.      SLP Plan  Discharge SLP treatment due to (comment)       Recommendations  Diet recommendations: Thin liquid(full liquids) Liquids provided via: Straw Medication Administration: Crushed with puree Supervision: Full supervision/cueing for compensatory strategies Compensations: Minimize environmental distractions;Slow rate;Small sips/bites Postural Changes and/or Swallow Maneuvers: Seated upright 90 degrees                Oral Care Recommendations: Oral care QID Follow up Recommendations: None SLP Visit Diagnosis: Dysphagia, unspecified (R13.10) Plan: Discharge SLP treatment due to (comment)       GO                Houston Siren 02/09/2019, 9:54 AM  Orbie Pyo Colvin Caroli.Ed Risk analyst [phone] Office [phone]

## 2019-02-09 NOTE — Progress Notes (Signed)
Pt's left forearm IV infiltrated this am. IV fluid was stopped.At the same time, pt's condom catheter was leaking. While pt was being turned and cleaned, a large amount of clear blood tinged fluid began leaking from his left arm. It appeared to be coming from a skin tear on his left arm which was covered by an Allevyn dressing.  Removed Allevyn dressing,cleansed site with normal saline and applied telfa,ABD pad, and kerlix dressing to  pt's left forearm and propped his arm up on a pillow. Will continue to monitor.

## 2019-02-10 DIAGNOSIS — K529 Noninfective gastroenteritis and colitis, unspecified: Principal | ICD-10-CM

## 2019-02-10 LAB — RENAL FUNCTION PANEL
ANION GAP: 7 (ref 5–15)
Albumin: 2.3 g/dL — ABNORMAL LOW (ref 3.5–5.0)
BUN: 8 mg/dL (ref 8–23)
CO2: 24 mmol/L (ref 22–32)
Calcium: 8.2 mg/dL — ABNORMAL LOW (ref 8.9–10.3)
Chloride: 107 mmol/L (ref 98–111)
Creatinine, Ser: 1.24 mg/dL (ref 0.61–1.24)
GFR calc Af Amer: 60 mL/min — ABNORMAL LOW (ref >60)
GFR calc non Af Amer: 52 mL/min — ABNORMAL LOW (ref >60)
Glucose, Bld: 97 mg/dL (ref 70–99)
POTASSIUM: 5 mmol/L (ref 3.5–5.1)
Phosphorus: 3.2 mg/dL (ref 2.5–4.6)
Sodium: 138 mmol/L (ref 135–145)

## 2019-02-10 MED ORDER — SCOPOLAMINE 1 MG/3DAYS TD PT72: 1.0000 | MEDICATED_PATCH | TRANSDERMAL | 12 refills | Status: AC

## 2019-02-10 MED ORDER — MORPHINE SULFATE (CONCENTRATE) 10 MG/0.5ML PO SOLN: 5.0000 mg | ORAL | 0 refills | Status: AC | PRN

## 2019-02-10 MED ORDER — ATROPINE SULFATE 1 % OP SOLN: 2.0000 [drp] | Freq: Four times a day (QID) | OPHTHALMIC | Status: DC | PRN

## 2019-02-10 NOTE — Progress Notes (Signed)
Nutrition Brief Note  Chart reviewed. Pt now transitioning to comfort care.  No further nutrition interventions warranted at this time.  Please re-consult as needed.   Cyleigh Massaro A. Sybella Harnish, RD, LDN, CDE Pager: [phone] After hours Pager: [phone]  

## 2019-02-10 NOTE — Progress Notes (Signed)
New referral for hospice at home post discharge from [facility].  I called and spoke with the patient's son, Jevon Shells "[name]" to notify that we received the hospice at home referral for his father.  He is in agreement with and request services for his dad upon discharge.  He states the address on the face sheet is his address and give me the address of his father as [address] [place], [place].  He states they have a hospital bed and a lift chair in the home for his father.  He can not think of any other DME he needs now.  He would like for the admission nurse to evaluate the DME needs when she is in the home for the patient's initial assessment.  He does state his father will need condom catheter supplies.  I will have the admission nurse take out to the initial visit.  Referral information gathered and sent to referral intake.  Thank you for allowing participation in this patient's care.  Dimas Aguas, MA, BSN, RN Clinical Nurse Liaison [facility] [place]

## 2019-02-10 NOTE — Progress Notes (Signed)
  Date: 02/10/2019  Patient name: BODEN STUCKY  Medical record number: [id number]  Date of birth: Oct 15, 1928   I have seen and evaluated this patient and I have discussed the plan of care with the house staff. Please see Dr. Dorothyann Peng note for complete details. I concur with her findings and plan.    Sid Falcon, MD 02/10/2019, 9:47 PM

## 2019-02-10 NOTE — Progress Notes (Signed)
Daily Progress Note   Patient Name: Terry Taylor       Date: 02/10/2019 DOB: [birth year]  Age: 85 y.o. MRN#: [id number] Attending Physician: Sid Falcon, MD Primary Care Physician: Alvester Chou, NP Admit Date: 01/28/2019  Reason for Consultation/Follow-up: Establishing goals of care and Terminal Care  Subjective:  Patient will flutter eyes to sternal rub but lethargic. Appears comfortable with regular respiratory pattern. Audible secretions.   No family at bedside.   Length of Stay: 13  Current Medications: Scheduled Meds:  . carbidopa-levodopa  1 tablet Oral BID  . mouth rinse  15 mL Mouth Rinse BID  . QUEtiapine  25 mg Oral BID  . scopolamine  1 patch Transdermal Q72H  . sodium chloride flush  3 mL Intravenous Q12H  . tamsulosin  0.4 mg Oral Daily    Continuous Infusions:   PRN Meds: acetaminophen **OR** acetaminophen, atropine, benzonatate, morphine CONCENTRATE, naphazoline-glycerin, phenol, promethazine  Physical Exam Vitals signs and nursing note reviewed.  Constitutional:      Appearance: He is ill-appearing.  HENT:     Head: Normocephalic and atraumatic.  Pulmonary:     Effort: No tachypnea, accessory muscle usage or respiratory distress.     Comments: Audible secretions Abdominal:     General: There is distension.     Tenderness: There is no abdominal tenderness.  Skin:    General: Skin is warm and dry.     Coloration: Skin is pale.  Neurological:     Mental Status: He is lethargic.            Vital Signs: BP 117/68 (BP Location: Right Arm)   Pulse (!) 53   Temp 98.5 F (36.9 C) (Oral)   Resp 17   Wt 83.2 kg   SpO2 94%   BMI 24.88 kg/m  SpO2: SpO2: 94 % O2 Device: O2 Device: Room Air O2 Flow Rate: O2 Flow Rate (L/min): 2  L/min  Intake/output summary:   Intake/Output Summary (Last 24 hours) at 02/10/2019 0945 Last data filed at 02/09/2019 1400 Gross per 24 hour  Intake 480 ml  Output -  Net 480 ml   LBM: Last BM Date: 02/04/19 Baseline Weight: Weight: 83.6 kg Most recent weight: Weight: 83.2 kg       Palliative Assessment/Data: PPS 20%   Flowsheet Rows  Most Recent Value  Intake Tab  Referral Department  Hospitalist  Unit at Time of Referral  Med/Surg Unit  Palliative Care Primary Diagnosis  Sepsis/Infectious Disease  Palliative Care Type  New Palliative care  Reason for referral  Clarify Goals of Care, End of Life Care Assistance  Date first seen by Palliative Care  02/09/19  Clinical Assessment  Palliative Performance Scale Score  20%  Psychosocial & Spiritual Assessment  Palliative Care Outcomes  Patient/Family meeting held?  Yes  Who was at the meeting?  daughter, son, multiple family members  Palliative Care Outcomes  Clarified goals of care, Provided end of life care assistance, ACP counseling assistance, Improved pain interventions, Improved non-pain symptom therapy, Counseled regarding hospice, Changed to focus on comfort, Completed durable DNR, Transitioned to hospice, Changed CPR status, Provided psychosocial or spiritual support      Patient Active Problem List   Diagnosis Date Noted  . Palliative care by specialist   . Agitation   . Increased oropharyngeal secretions   . Partial small bowel obstruction ([place])   . Ileus ([place])   . Gastroenteritis 01/29/2019  . Abdominal distension 01/28/2019  . Viral gastroenteritis 01/24/2019  . Diarrhea   . Dehydration 01/23/2019  . Goals of care, counseling/discussion   . Terminal care   . Pressure injury of skin 10/09/2017  . Polypharmacy   . Supplemental oxygen dependent   . PAF (paroxysmal atrial fibrillation) ([place])   . Benign essential HTN   . Parkinson disease ([place])   . Hypoalbuminemia due to protein-calorie malnutrition  ([place])   . Overdose 10/04/2017  . Altered mental status 10/04/2017  . UTI (urinary tract infection) 10/04/2017  . Anemia 10/04/2017  . Renal insufficiency 10/04/2017  . Paroxysmal A-fib ([place]) 01/11/2017  . Influenza A 01/09/2017  . COPD (chronic obstructive pulmonary disease) ([place]) 01/08/2017  . Dementia ([place]) 09/13/2016  . Chest pain, rule out acute myocardial infarction 09/12/2016  . Fever 03/07/2013  . Acute encephalopathy 03/07/2013  . Weakness generalized 03/07/2013  . HYPERCHOLESTEROLEMIA  IIA 04/09/2009  . Essential hypertension 04/09/2009  . Coronary artery disease due to lipid rich plaque 04/09/2009  . COPD exacerbation ([place]) 04/09/2009  . OSTEOARTHRITIS 04/09/2009  . BENIGN PROSTATIC HYPERTROPHY, HX OF 04/09/2009    Palliative Care Assessment & Plan   Patient Profile: 85 y.o. male  with past medical history of Parkinson's/dementia, CVA, afib, GERD, BPH, arthritis, and anxiety admitted on 01/28/2019 with diarrhea and abdominal distention. GI and cdiff negative. CT scan revealed distended stomach and proximal and mid small bowel with decompressed distal and terminal ileum without transition. Colon is fluid distended. Despite appropriate interventions, patient has shown no improvement of ileus and with poor nutritional status. Palliative medicine consultation for goals of care.   Assessment: Prolonged ileus Dehydration Electrolyte abnormalities Parkinson's/dementia Increased oropharyngeal secretions  Recommendations/Plan:  Comfort measures only. Discontinued interventions not aimed at comfort.   Continue prn medications for symptom management.  Hospice liaison in contact with family. Possibly home today with hospice services for EOL care.   Goals of Care and Additional Recommendations:  Limitations on Scope of Treatment: Full Comfort Care  Code Status: DNR/DNI   Code Status Orders  (From admission, onward)         Start     Ordered   02/09/19 1545  Do not  attempt resuscitation (DNR)  Continuous    Question Answer Comment  In the event of cardiac or respiratory ARREST Do not call a "code blue"   In the event  of cardiac or respiratory ARREST Do not perform Intubation, CPR, defibrillation or ACLS   In the event of cardiac or respiratory ARREST Use medication by any route, position, wound care, and other measures to relive pain and suffering. May use oxygen, suction and manual treatment of airway obstruction as needed for comfort.      02/09/19 1544        Code Status History    Date Active Date Inactive Code Status Order ID Comments User Context   01/28/2019 1858 02/09/2019 1544 Full Code [id number]  Welford Roche, MD ED   01/23/2019 1626 01/24/2019 1941 Full Code [id number]  Ina Homes, MD ED   10/10/2017 1450 10/12/2017 1930 DNR [id number]  Philis Pique, NP Inpatient   10/04/2017 0427 10/10/2017 1450 Full Code [id number]  Jani Gravel, MD ED   01/09/2017 0113 01/11/2017 1433 Full Code [id number]  Ivor Costa, MD ED   09/13/2016 0402 09/13/2016 1936 Full Code [id number]  Edwin Dada, MD Inpatient   03/07/2013 0140 03/10/2013 1548 Full Code [id number]  Theressa Millard, MD ED       Prognosis:   < 2 weeks: likely days with prolonged ileus, severe decline in functional/cognitive/nutritional status, and underlying Parkinson's/dementia  Discharge Planning:  Home with Hospice  Care plan was discussed: No family at bedside  Thank you for allowing the Palliative Medicine Team to assist in the care of this patient.   Time In: 0925 Time Out: 0940 Total Time 15 Prolonged Time Billed  no      Greater than 50%  of this time was spent counseling and coordinating care related to the above assessment and plan.  Ihor Dow, FNP-C Palliative Medicine Team  Phone: [phone] Fax: [phone]  Please contact Palliative Medicine Team phone at [phone] for questions and concerns.

## 2019-02-10 NOTE — Progress Notes (Signed)
   Subjective: Terry Taylor is resting comfortably in bed however, he appeared comfortable. No family at bedside. Overnight the family had met with palliative and they decided on taking Terry Taylor home with home hospice.   Objective:  Vital signs in last 24 hours: Vitals:   02/09/19 0540 02/09/19 1335 02/09/19 2111 02/10/19 0600  BP: 135/82 (!) 95/57 128/85 117/68  Pulse: 70 72 66 (!) 53  Resp: '20 16 16 17  '$ Temp: 98.4 F (36.9 C) 98.3 F (36.8 C) 97.6 F (36.4 C) 98.5 F (36.9 C)  TempSrc: Oral Oral Oral Oral  SpO2: 97% 95% 98% 94%  Weight: 83.2 kg       General: Sleeping and snoring, NAD Cardiac: RRR, no m/r/g Pulmonary: Bilateral rhonchi, no wheezing, Abdomen: Soft, hypoactive BS  Assessment/Plan:  Principal Problem:   Dehydration Active Problems:   Dementia (HCC)   Parkinson disease (HCC)   Terminal care   Abdominal distension   Gastroenteritis   Ileus (H[facility])   Partial small bowel obstruction (H[facility])   Palliative care by specialist   Agitation   Increased oropharyngeal secretions  This is an 85 year old male with a history of COPD, CAD, Parkinsons, and dementia who presented on 1/30 diarrhea and abdominal distension, found to be hypotensive and had respiratory distress that improved with [place]. CBC, GI and c diff were negative. CT scan showed distended stomach and proximal and mid small bowel with decompressed distal and terminal ileum without transition, colon is fluid distended.  Dehydration 2/2 diarrhea ProlongedIleus -He continues to not have any bowel movements and ileus is persistent.  Palliative care met with the family last night to discuss goals of care and they would like to go home with home hospice.  This is in the works at this time and he is stable for discharge when everything has been arranged. -Up and out of bed as tolerated -Replete electrolytes PRN -Discontinue D5 1/2 NS -Continue CLD as tolerated  Electrolyte abnormalities: -WNL today. Going  home with home hospice. No further lab draws.   Parkinson's, dementia: -Patient has been started on full liquid diet, continue home quetiapine and carbidopa-levidopa  FEN:D5 1/2 NScc/hr, replete lytes prn,Clear liquid VTE ppx: Lovenox  Code Status: FULL   Dispo: Anticipated discharge in approximately today with home hospice.   [name], MD 02/10/2019, 6:46 AM Pager: [phone]

## 2019-02-10 NOTE — Care Management Note (Addendum)
Case Management Note  Patient Details  Name: Terry Taylor MRN: [id number] Date of Birth: 1928-10-07  Subjective/Objective:                    Action/Plan:Nurse and son ready for Called [facility] estimated arrival time of [facility] to 6n is within the hour. Son, nurse and Terry Taylor all aware.  See Kara's note. Confirmed with son Terry Taylor and Terry Taylor , hospice admission nurse will see patient and family at home today and order any needed DME at that time.   Terry Taylor wants [facility] transportation. Will call attending and ask to complete discharge . Will call Terry Taylor before [facility] arranged Expected Discharge Date:                  Expected Discharge Plan:  Home w Hospice Care  In-House Referral:  Hospice / Palliative Care  Discharge planning Services  CM Consult  Post Acute Care Choice:    Choice offered to:  Patient, Adult Children  DME Arranged:    DME Agency:     HH Arranged:  NA HH Agency:  [facility]  Status of Service:  In process, will continue to follow  If discussed at Long Length of Stay Meetings, dates discussed:    Additional Comments:  [name], RN 02/10/2019, 10:55 AM

## 2019-02-11 NOTE — Care Management Important Message (Signed)
Important Message  Patient Details  Name: Terry Taylor MRN: [id number] Date of Birth: Jan 05, 1928   Medicare Important Message Given:  Yes    Shagun Wordell [address] 02/11/2019, 8:23 AM

## 2019-02-15 DIAGNOSIS — L89152 Pressure ulcer of sacral region, stage 2: Secondary | ICD-10-CM | POA: Diagnosis not present

## 2019-02-15 DIAGNOSIS — M542 Cervicalgia: Secondary | ICD-10-CM | POA: Diagnosis not present

## 2019-02-15 DIAGNOSIS — J449 Chronic obstructive pulmonary disease, unspecified: Secondary | ICD-10-CM | POA: Diagnosis not present

## 2019-02-15 DIAGNOSIS — F0390 Unspecified dementia without behavioral disturbance: Secondary | ICD-10-CM | POA: Diagnosis not present

## 2019-02-15 DIAGNOSIS — R296 Repeated falls: Secondary | ICD-10-CM | POA: Diagnosis not present

## 2019-02-15 DIAGNOSIS — F05 Delirium due to known physiological condition: Secondary | ICD-10-CM | POA: Diagnosis not present

## 2019-02-15 DIAGNOSIS — R2681 Unsteadiness on feet: Secondary | ICD-10-CM | POA: Diagnosis not present

## 2019-02-15 DIAGNOSIS — G20 Parkinson's disease: Secondary | ICD-10-CM | POA: Diagnosis not present

## 2019-02-15 DIAGNOSIS — F0281 Dementia in other diseases classified elsewhere with behavioral disturbance: Secondary | ICD-10-CM | POA: Diagnosis not present

## 2019-02-15 DIAGNOSIS — M129 Arthropathy, unspecified: Secondary | ICD-10-CM | POA: Diagnosis not present

## 2019-03-01 DEATH — deceased

## 2019-09-10 IMAGING — CR DG ABDOMEN 1V
1 series · 1 of 1 positions shown · non-contrast
Comparison: 02/06/2019

CLINICAL DATA: Ileus

EXAM:
ABDOMEN - 1 VIEW

[abdomen kub]
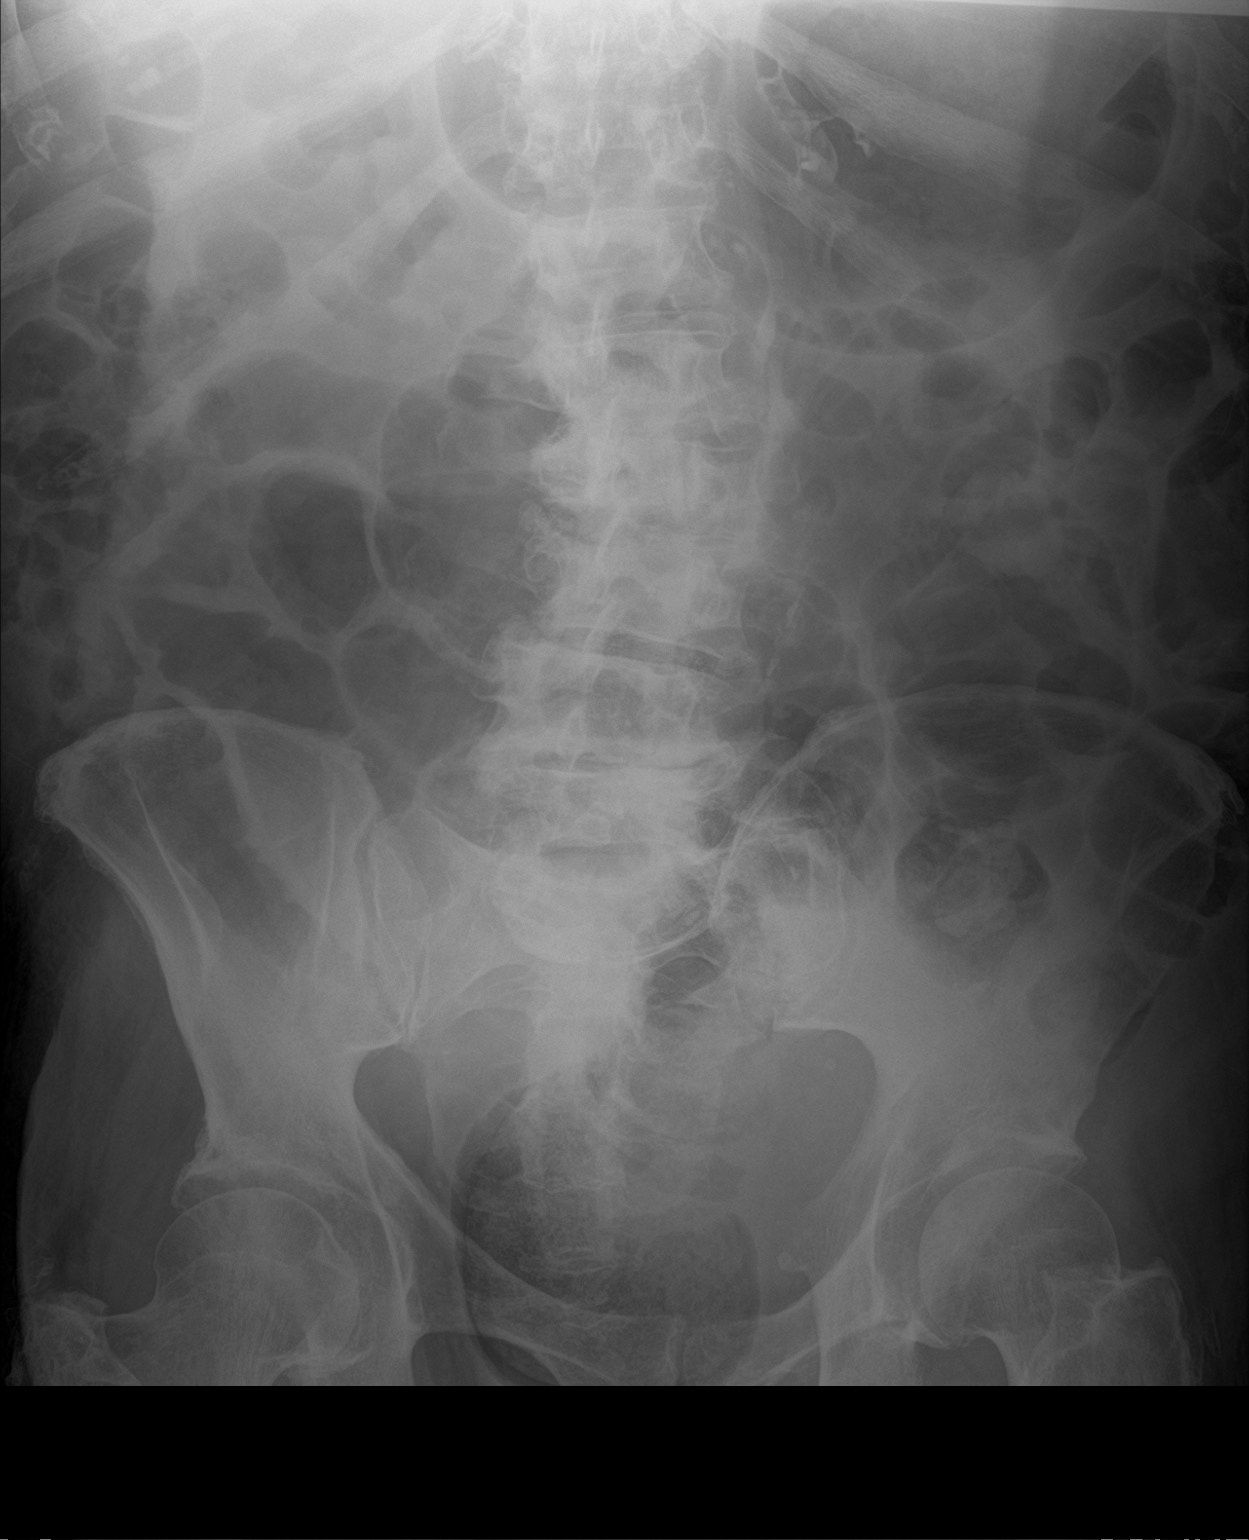

[1 of 1 positions shown; findings below may reference images not displayed]

FINDINGS: There is diffuse gaseous distention of both large and small bowel
loops consistent with ileus. Degenerative changes in the lumbar
spine. No evidence for free intraperitoneal air on the supine view
performed.
IMPRESSION: Diffuse ileus.
